# Patient Record
Sex: Female | Born: 1975
Health system: Southern US, Community
[De-identification: ages and names within clinical notes are randomized; demographics above are authoritative.]

## PROBLEM LIST (undated history)

## (undated) DIAGNOSIS — F1020 Alcohol dependence, uncomplicated: Secondary | ICD-10-CM

## (undated) DIAGNOSIS — K589 Irritable bowel syndrome without diarrhea: Secondary | ICD-10-CM

## (undated) DIAGNOSIS — F319 Bipolar disorder, unspecified: Secondary | ICD-10-CM

## (undated) DIAGNOSIS — G40909 Epilepsy, unspecified, not intractable, without status epilepticus: Secondary | ICD-10-CM

## (undated) DIAGNOSIS — E7800 Pure hypercholesterolemia, unspecified: Secondary | ICD-10-CM

## (undated) DIAGNOSIS — E039 Hypothyroidism, unspecified: Secondary | ICD-10-CM

## (undated) DIAGNOSIS — N83209 Unspecified ovarian cyst, unspecified side: Secondary | ICD-10-CM

## (undated) DIAGNOSIS — E538 Deficiency of other specified B group vitamins: Secondary | ICD-10-CM

## (undated) DIAGNOSIS — O139 Gestational [pregnancy-induced] hypertension without significant proteinuria, unspecified trimester: Secondary | ICD-10-CM

## (undated) DIAGNOSIS — J189 Pneumonia, unspecified organism: Secondary | ICD-10-CM

## (undated) DIAGNOSIS — Z8742 Personal history of other diseases of the female genital tract: Secondary | ICD-10-CM

## (undated) DIAGNOSIS — E119 Type 2 diabetes mellitus without complications: Secondary | ICD-10-CM

## (undated) DIAGNOSIS — N870 Mild cervical dysplasia: Secondary | ICD-10-CM

## (undated) DIAGNOSIS — I509 Heart failure, unspecified: Secondary | ICD-10-CM

## (undated) DIAGNOSIS — T1491XA Suicide attempt, initial encounter: Secondary | ICD-10-CM

## (undated) DIAGNOSIS — F988 Other specified behavioral and emotional disorders with onset usually occurring in childhood and adolescence: Secondary | ICD-10-CM

## (undated) DIAGNOSIS — R112 Nausea with vomiting, unspecified: Secondary | ICD-10-CM

## (undated) DIAGNOSIS — R011 Cardiac murmur, unspecified: Secondary | ICD-10-CM

## (undated) DIAGNOSIS — Z973 Presence of spectacles and contact lenses: Secondary | ICD-10-CM

## (undated) DIAGNOSIS — E669 Obesity, unspecified: Secondary | ICD-10-CM

## (undated) DIAGNOSIS — Z5189 Encounter for other specified aftercare: Secondary | ICD-10-CM

## (undated) DIAGNOSIS — F32A Depression, unspecified: Secondary | ICD-10-CM

## (undated) DIAGNOSIS — F329 Major depressive disorder, single episode, unspecified: Secondary | ICD-10-CM

## (undated) DIAGNOSIS — K449 Diaphragmatic hernia without obstruction or gangrene: Secondary | ICD-10-CM

## (undated) DIAGNOSIS — K219 Gastro-esophageal reflux disease without esophagitis: Secondary | ICD-10-CM

## (undated) DIAGNOSIS — Z9889 Other specified postprocedural states: Secondary | ICD-10-CM

## (undated) DIAGNOSIS — F419 Anxiety disorder, unspecified: Secondary | ICD-10-CM

## (undated) DIAGNOSIS — K760 Fatty (change of) liver, not elsewhere classified: Secondary | ICD-10-CM

## (undated) DIAGNOSIS — Z8719 Personal history of other diseases of the digestive system: Secondary | ICD-10-CM

## (undated) DIAGNOSIS — Z8781 Personal history of (healed) traumatic fracture: Secondary | ICD-10-CM

## (undated) DIAGNOSIS — L723 Sebaceous cyst: Secondary | ICD-10-CM

## (undated) DIAGNOSIS — D649 Anemia, unspecified: Secondary | ICD-10-CM

## (undated) DIAGNOSIS — R32 Unspecified urinary incontinence: Secondary | ICD-10-CM

## (undated) HISTORY — DX: Heart failure, unspecified: I50.9

## (undated) HISTORY — DX: Encounter for other specified aftercare: Z51.89

## (undated) HISTORY — DX: Obesity, unspecified: E66.9

## (undated) HISTORY — PX: VAGINAL HYSTERECTOMY: SUR661

## (undated) HISTORY — DX: Type 2 diabetes mellitus without complications: E11.9

## (undated) HISTORY — DX: Alcohol dependence, uncomplicated: F10.20

## (undated) HISTORY — DX: Cardiac murmur, unspecified: R01.1

## (undated) HISTORY — PX: GYNECOLOGIC CRYOSURGERY: SHX857

## (undated) HISTORY — DX: Epilepsy, unspecified, not intractable, without status epilepticus: G40.909

## (undated) HISTORY — DX: Other specified behavioral and emotional disorders with onset usually occurring in childhood and adolescence: F98.8

## (undated) HISTORY — PX: WISDOM TOOTH EXTRACTION: SHX21

---

## 1999-05-25 HISTORY — PX: CHOLECYSTECTOMY: SHX55

## 1999-05-25 HISTORY — PX: TUBAL LIGATION: SHX77

## 2008-05-24 HISTORY — PX: GASTRIC BYPASS: SHX52

## 2010-08-14 ENCOUNTER — Ambulatory Visit (INDEPENDENT_AMBULATORY_CARE_PROVIDER_SITE_OTHER): Payer: BC Managed Care – PPO | Admitting: Gynecology

## 2010-08-14 DIAGNOSIS — B3731 Acute candidiasis of vulva and vagina: Secondary | ICD-10-CM

## 2010-08-14 DIAGNOSIS — N920 Excessive and frequent menstruation with regular cycle: Secondary | ICD-10-CM

## 2010-08-14 DIAGNOSIS — B373 Candidiasis of vulva and vagina: Secondary | ICD-10-CM

## 2010-08-14 DIAGNOSIS — N898 Other specified noninflammatory disorders of vagina: Secondary | ICD-10-CM

## 2010-08-14 DIAGNOSIS — N946 Dysmenorrhea, unspecified: Secondary | ICD-10-CM

## 2010-09-02 ENCOUNTER — Ambulatory Visit: Payer: Self-pay | Admitting: Gynecology

## 2010-11-20 ENCOUNTER — Ambulatory Visit (INDEPENDENT_AMBULATORY_CARE_PROVIDER_SITE_OTHER): Payer: BC Managed Care – PPO | Admitting: Gynecology

## 2010-11-20 DIAGNOSIS — B3731 Acute candidiasis of vulva and vagina: Secondary | ICD-10-CM

## 2010-11-20 DIAGNOSIS — N898 Other specified noninflammatory disorders of vagina: Secondary | ICD-10-CM

## 2010-11-20 DIAGNOSIS — N926 Irregular menstruation, unspecified: Secondary | ICD-10-CM

## 2010-11-20 DIAGNOSIS — B373 Candidiasis of vulva and vagina: Secondary | ICD-10-CM

## 2010-11-30 ENCOUNTER — Encounter (HOSPITAL_COMMUNITY)
Admission: RE | Admit: 2010-11-30 | Discharge: 2010-11-30 | Disposition: A | Discharge status: Home / Self Care | Payer: BC Managed Care – PPO | Source: Ambulatory Visit | Attending: Gynecology | Admitting: Gynecology

## 2010-11-30 ENCOUNTER — Encounter (HOSPITAL_COMMUNITY): Payer: Self-pay

## 2010-11-30 HISTORY — DX: Gastro-esophageal reflux disease without esophagitis: K21.9

## 2010-11-30 HISTORY — DX: Other specified postprocedural states: Z98.890

## 2010-11-30 HISTORY — DX: Heart failure, unspecified: I50.9

## 2010-11-30 HISTORY — DX: Other specified postprocedural states: R11.2

## 2010-11-30 HISTORY — DX: Diaphragmatic hernia without obstruction or gangrene: K44.9

## 2010-11-30 LAB — CBC
HCT: 34.7 % — ABNORMAL LOW (ref 36.0–46.0)
Hemoglobin: 11.1 g/dL — ABNORMAL LOW (ref 12.0–15.0)
MCH: 28 pg (ref 26.0–34.0)
MCHC: 32 g/dL (ref 30.0–36.0)
MCV: 87.4 fL (ref 78.0–100.0)
Platelets: 298 10*3/uL (ref 150–400)
RBC: 3.97 MIL/uL (ref 3.87–5.11)
RDW: 13.8 % (ref 11.5–15.5)
WBC: 3.6 10*3/uL — ABNORMAL LOW (ref 4.0–10.5)

## 2010-11-30 LAB — COMPREHENSIVE METABOLIC PANEL
ALT: 11 U/L (ref 0–35)
AST: 17 U/L (ref 0–37)
Albumin: 3.5 g/dL (ref 3.5–5.2)
Alkaline Phosphatase: 62 U/L (ref 39–117)
BUN: 9 mg/dL (ref 6–23)
CO2: 27 mEq/L (ref 19–32)
Calcium: 8.9 mg/dL (ref 8.4–10.5)
Chloride: 102 mEq/L (ref 96–112)
Creatinine, Ser: 0.71 mg/dL (ref 0.50–1.10)
GFR calc Af Amer: >60 mL/min (ref >60)
GFR calc non Af Amer: >60 mL/min (ref >60)
Glucose, Bld: 90 mg/dL (ref 70–99)
Potassium: 4 mEq/L (ref 3.5–5.1)
Sodium: 137 mEq/L (ref 135–145)
Total Bilirubin: 0.2 mg/dL — ABNORMAL LOW (ref 0.3–1.2)
Total Protein: 7.2 g/dL (ref 6.0–8.3)

## 2010-11-30 LAB — MRSA PCR SCREENING: MRSA by PCR: NEGATIVE

## 2010-11-30 NOTE — Patient Instructions (Signed)
Written instructions given

## 2010-12-02 NOTE — H&P (Signed)
History and Physical    Chief complaint: menorrhagia, dysmenorrhea  History of present illness: 35 year old G1 P65 female presents complaining of menorrhagia and dysmenorrhea. Patient notes that over the past several years her menses have progressively gotten heavier and more irregular accelerating after her gastric bypass surgery. Outpatient evaluation by another gynecologist included normal blood work and hormonal studies and ultrasound which was suggestive of adenomyosis and an endometrial biopsy which was negative. She was placed on a low-dose oral contraceptives but has continued to bleed heavily with significant dysmenorrhea. Options for management were reviewed with the patient to include continued attempts at hormonal manipulation such as pill, patch, ring, Depo-Provera, Depo-Lupron, Mirena IUD, endometrial ablation and hysterectomy. The patient elects for a hysterectomy and she is admitted for attempted total laparoscopic hysterectomy.  Past medical history:  GERD, postpartum cardiomyopathy with congestive heart failure 2001, with reported complete recovery and released from cardiology  Past surgical history:  Gastric bypass, tubal sterilization, cholecystectomy  Allergies:  Vicodin and hydrocodone cause nausea and vomiting  Current medications:  See medication reconciliation sheet  Review of systems: Noncontributory  Family history: Noncontributory   Social history: Noncontributory   Physical exam:  Afebrile vital signs are stable  General: well developed , well nourished white female, no acute distress HEENT: normal  Lungs: clear to auscultation without wheezing, rales or rhonchi  Cardiac: regular rate without rubs, murmurs or gallops  Abdomen: soft, nontender without masses, guarding, rebound, organomegaly  Pelvic: external bus vagina: normal   Cervix: grossly normal  Uterus: normal size, midline and mobile, nontender  Adnexa: without masses or tenderness  Rectovaginal  exam within normal limits   Assessment: 35 year old G1 P1 female worsening menorrhagia dysmenorrhea refractile to oral contraceptives ultrasound suggestive of adenomyosis. Options for management as per discussed in history of present illness reviewed.  Plan: The patient was counseled as to the risks benefits indications and alternatives for laparoscopic hysterectomy. Recognizing the patient has had a tubal sterilization, she understands understands that hysterectomy is absolute and irreversible sterility. Sexuality following hysterectomy was reviewed and the risk for persistent orgasmic dysfunction and persistent dyspareunia was discussed understood and accepted. The ovarian conservation issue was discussed with the patient and the options for keeping both ovaries or removing both ovaries was reviewed. The patient understands by keeping both ovaries she is at risk for ovarian disease in the future, both benign and malignant. This may require future treatments as well as future surgeries and again the risk of ovarian cancer. She also understands that any hormonal symptoms that she is having such as PMS will persist by keeping her ovaries. If she has both ovaries removed, she is at risk for hypo-estrogenism to include menopausal symptoms, accelerated bone loss and accelerated cardiovascular risk. The issues of hormone replacement therapy were also reviewed to include the WHI study with risks of stroke, heart attack, DVT and the risks of breast cancer. The ACOG and NAMS statements on HRT were reviewed.  The patient understands the issues and she wants to keep both ovaries for continued hormone production accepting the risk of problems in the future requiring reoperation and treatment, the risk of ovarian cancer and the risk of hormonal symptoms. The patient does give me permission to remove one or both ovaries if significant disease is encountered at the time of surgery, and I feel that this is in her best  interest. The patient also understands that at any time during the procedure if significant scarring, adhesions or complications are encountered that I may  convert the laparoscopic approach to an exploratory laparotomy with a larger incision that would require a longer recovery period and a larger scar. The expected intraoperative and postoperative course was reviewed with the patient to include trocar placement, insufflation, multiple port sites, use of sharp blunt dissection, electrocautery, harmonic scalpel, possible laser and the use of clips and other permanent devices. The risks of infection requiring prolonged antibiotics as well as abscess formation or hematoma formation requiring reoperation and drainage were discussed. The risk of hemorrhage necessitating transfusion and the risks of transfusion to include transfusion reaction, hepatitis, HIV, mad cow disease and other unknown entities were discussed understood and accepted. The risk of inadvertent injury to internal organs either immediately recognized or delay recognized including bowel, bladder, ureters, vessels and nerves requiring major exploratory reparative surgery and future reparative surgeries including bladder repair, ureteral damage repair, bowel resection and ostomy formation was all discussed understood and accepted. The risk of incisional complications requiring opening and draining of incisions, closure by secondary intention and long-term issues such as scar formation, keloid, as well as hernia possibilities was all discussed understood and accepted. The patient's questions were answered to her satisfaction and she is ready to proceed with the surgery.   Dara Lords MD, 3:59 PM 12/02/2010

## 2010-12-06 ENCOUNTER — Encounter (HOSPITAL_COMMUNITY): Payer: Self-pay | Admitting: Anesthesiology

## 2010-12-06 MED ORDER — DEXTROSE 5 % IV SOLN
1.0000 g | Freq: Once | INTRAVENOUS | Status: DC
  Filled 2010-12-06: qty 1

## 2010-12-06 NOTE — Anesthesia Preprocedure Evaluation (Addendum)
Anesthesia Evaluation  Name, MR# and DOB Patient awake  General Assessment Comment  Reviewed: Allergy & Precautions, H&P  and Patient's Chart, lab work & pertinent test results  History of Anesthesia Complications (+) PONV  Airway Mallampati: II TM Distance: >3 FB Neck ROM: Full    Dental No notable dental hx (+) Teeth Intact and Caps   Pulmonaryneg pulmonary ROS    clear to auscultation  pulmonary exam normal   Cardiovascular Exercise Tolerance: Good +CHF Regular Normal Hx/o Post Partum Cardiomyopathy-resolved. Normal EchoHx/o Post Partum Cardiomyopathy-resolved. Normal Echo:    Neuro/Psych (+) {AN ROS/MED HX NEURO HEADACHES (+) Anxiety, Negative Psych ROS  GI/Hepatic/Renal negative Liver ROS, and negative Renal ROS (+) hiatal hernia,  GERD Controlled     Endo/Other  Hx/o Gastric Bypass Abdominal   Musculoskeletal negative musculoskeletal ROS (+)  Hematology On Cyanocobalamin since Gastric bypass   Peds  Reproductive/Obstetrics negative OB ROS   Anesthesia Other Findings             Anesthesia Physical Anesthesia Plan  ASA: II  Anesthesia Plan: General   Post-op Pain Management:    Induction: Intravenous  Airway Management Planned: Oral ETT  Additional Equipment:   Intra-op Plan:   Post-operative Plan: Extubation in OR  Informed Consent: I have reviewed the patients History and Physical, chart, labs and discussed the procedure including the risks, benefits and alternatives for the proposed anesthesia with the patient or authorized representative who has indicated his/her understanding and acceptance.   Dental advisory given  Plan Discussed with:   Anesthesia Plan Comments:         Anesthesia Quick Evaluation

## 2010-12-07 ENCOUNTER — Other Ambulatory Visit: Payer: Self-pay | Admitting: Gynecology

## 2010-12-07 ENCOUNTER — Encounter (HOSPITAL_COMMUNITY): Payer: Self-pay | Admitting: Anesthesiology

## 2010-12-07 ENCOUNTER — Ambulatory Visit (HOSPITAL_COMMUNITY): Payer: BC Managed Care – PPO | Admitting: Anesthesiology

## 2010-12-07 ENCOUNTER — Encounter (HOSPITAL_COMMUNITY): Payer: Self-pay | Admitting: *Deleted

## 2010-12-07 ENCOUNTER — Encounter: Payer: Self-pay | Admitting: Gynecology

## 2010-12-07 ENCOUNTER — Encounter (HOSPITAL_COMMUNITY): Payer: Self-pay | Admitting: Registered Nurse

## 2010-12-07 ENCOUNTER — Ambulatory Visit (HOSPITAL_COMMUNITY)
Admission: RE | Admit: 2010-12-07 | Discharge: 2010-12-08 | Disposition: A | Discharge status: Home / Self Care | Payer: BC Managed Care – PPO | Source: Ambulatory Visit | Attending: Gynecology | Admitting: Gynecology

## 2010-12-07 ENCOUNTER — Encounter (HOSPITAL_COMMUNITY)
Admission: RE | Disposition: A | Discharge status: Home / Self Care | Payer: Self-pay | Source: Ambulatory Visit | Attending: Gynecology

## 2010-12-07 DIAGNOSIS — N802 Endometriosis of fallopian tube: Secondary | ICD-10-CM

## 2010-12-07 DIAGNOSIS — Z01818 Encounter for other preprocedural examination: Secondary | ICD-10-CM | POA: Insufficient documentation

## 2010-12-07 DIAGNOSIS — N80209 Endometriosis of unspecified fallopian tube, unspecified depth: Secondary | ICD-10-CM

## 2010-12-07 DIAGNOSIS — N920 Excessive and frequent menstruation with regular cycle: Secondary | ICD-10-CM

## 2010-12-07 DIAGNOSIS — D259 Leiomyoma of uterus, unspecified: Secondary | ICD-10-CM

## 2010-12-07 DIAGNOSIS — N946 Dysmenorrhea, unspecified: Secondary | ICD-10-CM

## 2010-12-07 DIAGNOSIS — D252 Subserosal leiomyoma of uterus: Secondary | ICD-10-CM | POA: Insufficient documentation

## 2010-12-07 DIAGNOSIS — Z01812 Encounter for preprocedural laboratory examination: Secondary | ICD-10-CM | POA: Insufficient documentation

## 2010-12-07 HISTORY — PX: LAPAROSCOPIC TOTAL HYSTERECTOMY: SUR800

## 2010-12-07 LAB — HCG, QUANTITATIVE, PREGNANCY: hCG, Beta Chain, Quant, S: 1 m[IU]/mL (ref <5)

## 2010-12-07 SURGERY — HYSTERECTOMY, TOTAL, LAPAROSCOPIC
Anesthesia: General | Wound class: Clean Contaminated

## 2010-12-07 MED ORDER — ROCURONIUM BROMIDE 50 MG/5ML IV SOLN
INTRAVENOUS | Status: AC
  Filled 2010-12-07: qty 1

## 2010-12-07 MED ORDER — NEOSTIGMINE METHYLSULFATE 1 MG/ML IJ SOLN
INTRAMUSCULAR | Status: AC
  Filled 2010-12-07: qty 10

## 2010-12-07 MED ORDER — FENTANYL CITRATE 0.05 MG/ML IJ SOLN
25.0000 ug | INTRAMUSCULAR | Status: DC | PRN
  Administered 2010-12-07 (×2): 50 ug via INTRAVENOUS

## 2010-12-07 MED ORDER — MORPHINE SULFATE 2 MG/ML IJ SOLN: 2.0000 mg | INTRAMUSCULAR | Status: DC | PRN

## 2010-12-07 MED ORDER — DEXAMETHASONE SODIUM PHOSPHATE 10 MG/ML IJ SOLN
INTRAMUSCULAR | Status: DC | PRN
  Administered 2010-12-07: 10 mg via INTRAVENOUS

## 2010-12-07 MED ORDER — MIDAZOLAM HCL 2 MG/2ML IJ SOLN
INTRAMUSCULAR | Status: AC
  Filled 2010-12-07: qty 2

## 2010-12-07 MED ORDER — MIDAZOLAM HCL 5 MG/5ML IJ SOLN
INTRAMUSCULAR | Status: DC | PRN
  Administered 2010-12-07: 2 mg via INTRAVENOUS

## 2010-12-07 MED ORDER — SODIUM CHLORIDE 0.9 % IJ SOLN
3.0000 mL | Freq: Two times a day (BID) | INTRAMUSCULAR | Status: DC
  Administered 2010-12-07: 3 mL via INTRAVENOUS

## 2010-12-07 MED ORDER — LIDOCAINE HCL (CARDIAC) 20 MG/ML IV SOLN
INTRAVENOUS | Status: DC | PRN
  Administered 2010-12-07: 50 mg via INTRAVENOUS

## 2010-12-07 MED ORDER — KETOROLAC TROMETHAMINE 30 MG/ML IJ SOLN
INTRAMUSCULAR | Status: AC
  Filled 2010-12-07: qty 1

## 2010-12-07 MED ORDER — MORPHINE SULFATE 4 MG/ML IJ SOLN
2.0000 mg | INTRAMUSCULAR | Status: DC | PRN
  Administered 2010-12-07 – 2010-12-08 (×6): 2 mg via INTRAVENOUS
  Filled 2010-12-07 (×6): qty 1

## 2010-12-07 MED ORDER — ROCURONIUM BROMIDE 100 MG/10ML IV SOLN
INTRAVENOUS | Status: DC | PRN
  Administered 2010-12-07: 5 mg via INTRAVENOUS
  Administered 2010-12-07: 25 mg via INTRAVENOUS

## 2010-12-07 MED ORDER — SODIUM CHLORIDE 0.9 % IV SOLN: 250.0000 mL | INTRAVENOUS | Status: DC

## 2010-12-07 MED ORDER — LACTATED RINGERS IV SOLN
INTRAVENOUS | Status: DC
  Administered 2010-12-07 (×3): via INTRAVENOUS

## 2010-12-07 MED ORDER — NEOSTIGMINE METHYLSULFATE 1 MG/ML IJ SOLN
INTRAMUSCULAR | Status: DC | PRN
  Administered 2010-12-07: 2.5 mg via INTRAMUSCULAR

## 2010-12-07 MED ORDER — SUFENTANIL CITRATE 50 MCG/ML IV SOLN
INTRAVENOUS | Status: DC | PRN
  Administered 2010-12-07 (×5): 10 ug via INTRAVENOUS

## 2010-12-07 MED ORDER — ONDANSETRON HCL 4 MG/2ML IJ SOLN
INTRAMUSCULAR | Status: AC
  Filled 2010-12-07: qty 2

## 2010-12-07 MED ORDER — ONDANSETRON HCL 4 MG/2ML IJ SOLN
4.0000 mg | Freq: Four times a day (QID) | INTRAMUSCULAR | Status: DC | PRN
  Administered 2010-12-07 (×2): 4 mg via INTRAVENOUS
  Filled 2010-12-07 (×2): qty 2

## 2010-12-07 MED ORDER — HYDROMORPHONE HCL 1 MG/ML IJ SOLN
INTRAMUSCULAR | Status: AC
  Filled 2010-12-07: qty 1

## 2010-12-07 MED ORDER — KETOROLAC TROMETHAMINE 30 MG/ML IJ SOLN
30.0000 mg | Freq: Four times a day (QID) | INTRAMUSCULAR | Status: AC
  Administered 2010-12-07 (×2): 30 mg via INTRAVENOUS
  Filled 2010-12-07 (×3): qty 1

## 2010-12-07 MED ORDER — LIDOCAINE HCL (CARDIAC) 20 MG/ML IV SOLN
INTRAVENOUS | Status: AC
  Filled 2010-12-07: qty 5

## 2010-12-07 MED ORDER — HYDROMORPHONE HCL 1 MG/ML IJ SOLN
INTRAMUSCULAR | Status: DC | PRN
  Administered 2010-12-07: 1 mg via INTRAVENOUS

## 2010-12-07 MED ORDER — SUCCINYLCHOLINE CHLORIDE 20 MG/ML IJ SOLN
INTRAMUSCULAR | Status: DC | PRN
  Administered 2010-12-07: 110 mg via INTRAVENOUS

## 2010-12-07 MED ORDER — PROPOFOL 10 MG/ML IV EMUL
INTRAVENOUS | Status: DC | PRN
  Administered 2010-12-07: 170 mg via INTRAVENOUS

## 2010-12-07 MED ORDER — KETOROLAC TROMETHAMINE 60 MG/2ML IM SOLN: INTRAMUSCULAR | Status: DC | PRN

## 2010-12-07 MED ORDER — ONDANSETRON HCL 4 MG PO TABS: 4.0000 mg | ORAL_TABLET | Freq: Four times a day (QID) | ORAL | Status: DC | PRN

## 2010-12-07 MED ORDER — SUFENTANIL CITRATE 50 MCG/ML IV SOLN
INTRAVENOUS | Status: AC
  Filled 2010-12-07: qty 1

## 2010-12-07 MED ORDER — BUPIVACAINE HCL (PF) 0.25 % IJ SOLN
INTRAMUSCULAR | Status: DC | PRN
  Administered 2010-12-07: 8 mL

## 2010-12-07 MED ORDER — GLYCOPYRROLATE 0.2 MG/ML IJ SOLN
INTRAMUSCULAR | Status: AC
  Filled 2010-12-07: qty 1

## 2010-12-07 MED ORDER — DEXTROSE IN LACTATED RINGERS 5 % IV SOLN
INTRAVENOUS | Status: DC
  Administered 2010-12-07 – 2010-12-08 (×3): via INTRAVENOUS

## 2010-12-07 MED ORDER — PANTOPRAZOLE SODIUM 40 MG PO TBEC
DELAYED_RELEASE_TABLET | ORAL | Status: AC
  Administered 2010-12-07: 40 mg via ORAL
  Filled 2010-12-07: qty 1

## 2010-12-07 MED ORDER — PROMETHAZINE HCL 25 MG/ML IJ SOLN
6.2500 mg | INTRAMUSCULAR | Status: DC | PRN
  Administered 2010-12-07: 6.25 mg via INTRAVENOUS

## 2010-12-07 MED ORDER — OXYCODONE-ACETAMINOPHEN 5-325 MG PO TABS
1.0000 | ORAL_TABLET | ORAL | Status: DC | PRN
  Administered 2010-12-07: 2 via ORAL
  Administered 2010-12-07: 1 via ORAL
  Administered 2010-12-08: 2 via ORAL
  Administered 2010-12-08: 1 via ORAL
  Filled 2010-12-07 (×2): qty 1
  Filled 2010-12-07 (×2): qty 2

## 2010-12-07 MED ORDER — SCOPOLAMINE 1 MG/3DAYS TD PT72
MEDICATED_PATCH | TRANSDERMAL | Status: AC
  Administered 2010-12-07: 1.5 mg via TRANSDERMAL
  Filled 2010-12-07: qty 1

## 2010-12-07 MED ORDER — KETOROLAC TROMETHAMINE 30 MG/ML IJ SOLN: 30.0000 mg | Freq: Four times a day (QID) | INTRAMUSCULAR | Status: DC

## 2010-12-07 MED ORDER — DEXTROSE 5 % IV SOLN
1.0000 g | INTRAVENOUS | Status: DC | PRN
  Administered 2010-12-07: 1 g via INTRAVENOUS

## 2010-12-07 MED ORDER — SODIUM BICARBONATE 8.4 % IV SOLN
INTRAVENOUS | Status: AC
  Filled 2010-12-07: qty 50

## 2010-12-07 MED ORDER — PROMETHAZINE HCL 25 MG/ML IJ SOLN
INTRAMUSCULAR | Status: AC
  Filled 2010-12-07: qty 1

## 2010-12-07 MED ORDER — KETOROLAC TROMETHAMINE 30 MG/ML IJ SOLN
INTRAMUSCULAR | Status: DC | PRN
  Administered 2010-12-07: 30 mg via INTRAVENOUS

## 2010-12-07 MED ORDER — ONDANSETRON HCL 4 MG/2ML IJ SOLN
INTRAMUSCULAR | Status: DC | PRN
  Administered 2010-12-07: 4 mg via INTRAVENOUS

## 2010-12-07 MED ORDER — MEPERIDINE HCL 25 MG/ML IJ SOLN: 6.2500 mg | INTRAMUSCULAR | Status: DC | PRN

## 2010-12-07 MED ORDER — DIPHENHYDRAMINE HCL 25 MG PO CAPS: 50.0000 mg | ORAL_CAPSULE | Freq: Four times a day (QID) | ORAL | Status: DC | PRN

## 2010-12-07 MED ORDER — DEXAMETHASONE SODIUM PHOSPHATE 10 MG/ML IJ SOLN
INTRAMUSCULAR | Status: AC
  Filled 2010-12-07: qty 1

## 2010-12-07 MED ORDER — PANTOPRAZOLE SODIUM 40 MG PO TBEC
40.0000 mg | DELAYED_RELEASE_TABLET | Freq: Once | ORAL | Status: AC
  Administered 2010-12-07: 40 mg via ORAL

## 2010-12-07 MED ORDER — GLYCOPYRROLATE 0.2 MG/ML IJ SOLN
INTRAMUSCULAR | Status: DC | PRN
  Administered 2010-12-07: .3 mg via INTRAVENOUS

## 2010-12-07 MED ORDER — PANTOPRAZOLE SODIUM 40 MG PO TBEC
40.0000 mg | DELAYED_RELEASE_TABLET | Freq: Every day | ORAL | Status: DC
  Administered 2010-12-07: 40 mg via ORAL
  Filled 2010-12-07: qty 1

## 2010-12-07 MED ORDER — ALPRAZOLAM 0.5 MG PO TABS: 0.5000 mg | ORAL_TABLET | Freq: Every evening | ORAL | Status: DC | PRN

## 2010-12-07 MED ORDER — SCOPOLAMINE 1 MG/3DAYS TD PT72
1.0000 | MEDICATED_PATCH | Freq: Once | TRANSDERMAL | Status: DC
  Administered 2010-12-07: 1.5 mg via TRANSDERMAL

## 2010-12-07 MED ORDER — FENTANYL CITRATE 0.05 MG/ML IJ SOLN
INTRAMUSCULAR | Status: AC
  Filled 2010-12-07: qty 2

## 2010-12-07 MED ORDER — SODIUM CHLORIDE 0.9 % IJ SOLN: 3.0000 mL | INTRAMUSCULAR | Status: DC | PRN

## 2010-12-07 MED ORDER — PROPOFOL 10 MG/ML IV EMUL
INTRAVENOUS | Status: AC
  Filled 2010-12-07: qty 20

## 2010-12-07 SURGICAL SUPPLY — 47 items
BLADE SURG 15 STRL LF C SS BP (BLADE) ×1 IMPLANT
BLADE SURG 15 STRL SS (BLADE) ×1
CABLE HIGH FREQUENCY MONO STRZ (ELECTRODE) IMPLANT
CLOTH BEACON ORANGE TIMEOUT ST (SAFETY) ×2 IMPLANT
CONT PATH 16OZ SNAP LID 3702 (MISCELLANEOUS) IMPLANT
COVER LIGHT HANDLE  1/PK (MISCELLANEOUS) ×1
COVER LIGHT HANDLE 1/PK (MISCELLANEOUS) ×1 IMPLANT
COVER MAYO STAND STRL (DRAPES) ×2 IMPLANT
COVER TABLE BACK 60X90 (DRAPES) ×2 IMPLANT
DERMABOND ADVANCED (GAUZE/BANDAGES/DRESSINGS) ×2 IMPLANT
DEVICE SUTURE ENDOST 10MM (ENDOMECHANICALS) ×4 IMPLANT
DISSECTOR BLUNT TIP ENDO 5MM (MISCELLANEOUS) IMPLANT
DRAPE CAMERA CLOSED 9X96 (DRAPES) ×2 IMPLANT
DRAPE HYSTEROSCOPY (DRAPE) ×2 IMPLANT
ENDOSTITCH 0 SINGLE 48 (SUTURE) ×10 IMPLANT
EVACUATOR SMOKE 8.L (FILTER) ×2 IMPLANT
GLOVE BIO SURGEON STRL SZ7.5 (GLOVE) ×6 IMPLANT
GLOVE BIOGEL PI IND STRL 7.5 (GLOVE) ×1 IMPLANT
GLOVE BIOGEL PI INDICATOR 7.5 (GLOVE) ×1
GLOVE INDICATOR 7.0 STRL GRN (GLOVE) ×2 IMPLANT
GLOVE SURG SS PI 6.5 STRL IVOR (GLOVE) ×2 IMPLANT
GLOVE SURG SS PI 7.0 STRL IVOR (GLOVE) ×6 IMPLANT
GOWN BRE IMP SLV AUR LG STRL (GOWN DISPOSABLE) ×6 IMPLANT
GYRUS RUMI II 4.0CM BLUE (DISPOSABLE) ×2
NS IRRIG 1000ML POUR BTL (IV SOLUTION) ×2 IMPLANT
OCCLUDER COLPOPNEUMO (BALLOONS) ×2 IMPLANT
PACK LAPAROSCOPY BASIN (CUSTOM PROCEDURE TRAY) ×2 IMPLANT
RUMI II GYRUS 4.0CM BLUE (DISPOSABLE) ×1 IMPLANT
SCALPEL HARMONIC ACE (MISCELLANEOUS) ×2 IMPLANT
SCISSORS LAP 5X35 DISP (ENDOMECHANICALS) IMPLANT
SET CYSTO W/LG BORE CLAMP LF (SET/KITS/TRAYS/PACK) IMPLANT
SET IRRIG TUBING LAPAROSCOPIC (IRRIGATION / IRRIGATOR) ×2 IMPLANT
SUT PLAIN 4 0 FS 2 27 (SUTURE) ×4 IMPLANT
SUT VICRYL 0 UR6 27IN ABS (SUTURE) ×2 IMPLANT
SYR 50ML LL SCALE MARK (SYRINGE) ×2 IMPLANT
SYR BULB IRRIGATION 50ML (SYRINGE) ×2 IMPLANT
TIP UTERINE 5.1X6CM LAV DISP (MISCELLANEOUS) IMPLANT
TIP UTERINE 6.7X10CM GRN DISP (MISCELLANEOUS) IMPLANT
TIP UTERINE 6.7X6CM WHT DISP (MISCELLANEOUS) IMPLANT
TIP UTERINE 6.7X8CM BLUE DISP (MISCELLANEOUS) ×2 IMPLANT
TOWEL OR 17X24 6PK STRL BLUE (TOWEL DISPOSABLE) ×4 IMPLANT
TRAY FOLEY CATH 14FR (SET/KITS/TRAYS/PACK) ×2 IMPLANT
TROCAR BALLN 12MMX100 BLUNT (TROCAR) IMPLANT
TROCAR XCEL NON-BLD 11X100MML (ENDOMECHANICALS) ×4 IMPLANT
TROCAR XCEL NON-BLD 5MMX100MML (ENDOMECHANICALS) ×4 IMPLANT
WARMER LAPAROSCOPE (MISCELLANEOUS) ×2 IMPLANT
WATER STERILE IRR 1000ML POUR (IV SOLUTION) ×2 IMPLANT

## 2010-12-07 NOTE — Progress Notes (Signed)
Ana Rose  Date of surgery  Patient reports very little discomfort, is awake alert. On exam HEENT normal Lungs clear Cardiac regular rate no rubs murmurs or gallops Abdomen soft active bowel sounds dressings dry Scant vaginal discharge reported Assessment and plan Reviewed results of surgery with the patient and her husband to include the findings of leiomyoma and endometriosis. Patient is doing well from a pain standpoint and will continue routine postoperative care. I did review discharge instructions with them both to include ASAP call precautions and routine postoperative instructions. We'll plan on discharge tomorrow assuming she continues well and follow up in the office in 2 weeks.  Dara Lords MD, 2:06 PM 12/07/2010

## 2010-12-07 NOTE — Anesthesia Procedure Notes (Signed)
Performed by: Raechel Chute, Kenon Delashmit

## 2010-12-07 NOTE — Anesthesia Postprocedure Evaluation (Signed)
Vital signs stable Patient alert Pain and nausea are controlled No apparent anesthetic complications No follow up care needed 

## 2010-12-07 NOTE — Transfer of Care (Signed)
Immediate Anesthesia Transfer of Care Note  Patient: Ana Rose  Procedure(s) Performed:  HYSTERECTOMY TOTAL LAPAROSCOPIC - Colporation of endometriosis  Patient Location: PACU  Anesthesia Type: General  Level of Consciousness: awake, alert  and oriented  Airway & Oxygen Therapy: Patient Spontanous Breathing and Patient connected to nasal cannula oxygen  Post-op Assessment: Report given to PACU RN and Post -op Vital signs reviewed and stable  Post vital signs: stable  Complications: No apparent anesthesia complications

## 2010-12-07 NOTE — Op Note (Signed)
Post Operative Note  Date of surgery:  12/07/2010  Pre Op Dx: dysmenorrhea, menorrhagia  Post Op Dx  Dysmenorrhea, menorrhagia, leiomyoma, endometriosis  Procedure:   Total laparoscopic hysterectomy, fulguration endometriosis  Surgeon:  Dara Lords  Assistant:  Reynaldo Minium  Anesthesia:  General  Local Injection:  8 cc 0.25% Marcaine local skin injection  EBL:  50 cc  Complications:  None  Specimen:  Uterus with cervix to pathology  Findings: EUA:  External BUS vagina normal, cervix normal, uterus normal size shape and contour midline and mobile, adnexa without masses   Operative:   Anterior cul-de-sac normal, posterior cul-de-sac normal, uterus normal size with several small subserosal leiomyoma noted.  right and left fallopian tubes with evidence of prior tubal sterilization and classic powder burn endometriotic lesions noted on tubal segments bilaterally, both fulgurated. Ovaries grossly normal free and mobile. Upper abdominal exam liver smooth without adhesions gallbladder not visualized appendix not visualized.   Procedure:  Patient was taken to the operating room, properly identified, placed in the supine position and underwent endotracheal anesthesia without difficulty.  Patient was placed in the low dorsal lithotomy position and received an abdominal perineal and vaginal preparation with Betadine solution. An EUA was performed with findings noted above. The uterus was then sounded and the cervix was measured for the appropriate choice of the RUMI uterine manipulator and the KOH cup and these were placed without difficulty.  A Foley catheter was then placed in a sterile technique and the patient was draped in the usual fashion.  A repeat transverse infraumbilical incision was made and using the 10 mm direct entry trocar, the abdomen was directly entered without difficulty and subsequently insufflated. Right and left 5 mm suprapubic ports were then placed under direct  visualization after transillumination for the vessels without difficulty.  Examination of the pelvic organs and upper abdominal exam was then carried out with findings noted above. Using the harmonic scalpel the right uterine ovarian pedicle was identified and transected without difficulty and the right broad ligament and round ligament was likewise transected using the harmonic scalpel. A similar procedure was then carried out on the other side. The anterior peritoneal reflection of the vesicouterine fold was then sharply incised and the bladder flap was then sharply and bluntly developed without difficulty below the palpated KOH cup ring.  The right uterine vessels were then identified, bipolar cauterized to flow of 0 and incised using the harmonic scalpel. A similar procedure was carried out on the other side. Using the harmonic scalpel the anterior vagina was then entered along the easily palpable Coke cup and the uterus was then circumferentially excised running along the KOH cup. The uterus was then removed through the vagina and a suction bulb was placed in the vagina to maintain the pneumoperitoneum. The vagina was then closed anterior to posterior using the Endo Stitch suture applier in interrupted figure-of-eight sutures. To accomplish this the right 5 mm suprapubic port was replaced with a 10 mm port under direct visualization after transillumination for the vessels, to allow for use of the Endo Stitch applier. The pelvis was then irrigated and reinspected under low pressure situation showing adequate hemostasis. All pedicles were likewise inspected as was the upper vaginal cuff and again hemostasis was visualized. The classic endometrial  implants on both the right and left fallopian tubes were then bipolar cauterized.The suprapubic ports were then removed under direct visualization, showing adequate hemostasis and the infraumbilical port was then backed out under direct visualization showing adequate  hemostasis and no evidence of hernia formation. All skin incisions were injected using 0.25% Marcaine and the infraumbilical and right suprapubic port sites were closed using 0 Vicryl suture in an interrupted subcutaneous fascial stitch. All skin incisions were closed using 4-0 plain suture in interrupted cuticular stitch. The bulb suction was removed and a bimanual showed the cuff to have a secure closure. The patient was awakened without difficulty and taken to the recovery room in good condition having tolerated the procedure well with clear yellow free-flowing urine. The patient did receive intraoperative Toradol noting that her allergy to nonsteroidal anti-inflammatories was due to her gastric bypass causing pouch irritation and not a true allergic reaction.   Dara Lords MD, 9:21 AM 12/07/2010

## 2010-12-07 NOTE — H&P (Signed)
Dictated history and physical current.  Dara Lords MD, 7:05 AM 12/07/2010

## 2010-12-08 ENCOUNTER — Encounter: Payer: Self-pay | Admitting: Gynecology

## 2010-12-08 LAB — CBC
HCT: 29.5 % — ABNORMAL LOW (ref 36.0–46.0)
Hemoglobin: 9.6 g/dL — ABNORMAL LOW (ref 12.0–15.0)
MCH: 28.5 pg (ref 26.0–34.0)
MCHC: 32.5 g/dL (ref 30.0–36.0)
MCV: 87.5 fL (ref 78.0–100.0)
Platelets: 217 10*3/uL (ref 150–400)
RBC: 3.37 MIL/uL — ABNORMAL LOW (ref 3.87–5.11)
RDW: 14.1 % (ref 11.5–15.5)
WBC: 9.6 10*3/uL (ref 4.0–10.5)

## 2010-12-08 MED ORDER — ONDANSETRON HCL 4 MG PO TABS: 4.0000 mg | ORAL_TABLET | Freq: Four times a day (QID) | ORAL | Status: AC | PRN

## 2010-12-08 MED ORDER — OXYCODONE-ACETAMINOPHEN 5-325 MG PO TABS: 1.0000 | ORAL_TABLET | ORAL | Status: AC | PRN

## 2010-12-08 NOTE — Discharge Summary (Signed)
  Date of admission: 12/07/2010 Date of discharge: 12/08/2010 Discharge diagnoses: Menorrhagia, dysmenorrhea, leiomyoma, endometriosis Procedure: Total laparoscopic hysterectomy fulguration of endometriosis 12/08/2010 Pathology: Pending Hospital course: Patient underwent an uncomplicated total laparoscopic hysterectomy fulguration of endometriosis 12/08/2010. Patient's postoperative course was uncomplicated and she was discharged on postoperative day #1 ambulating well tolerating a regular diet voiding without difficulty with a postoperative hemoglobin of 9.6. Patient received precautions instructions and follow-up and will be seen in the office 2 weeks following discharge received a prescription for oxycodone acetaminophen 5-325 mg #25 one to 2 by mouth every 4 hours when necessary pain no refill.

## 2010-12-08 NOTE — Progress Notes (Signed)
1 Day Post-Op Procedure(s) (LRB): HYSTERECTOMY TOTAL LAPAROSCOPIC (N/A)  Subjective: Patient reports has no problems, feels well, pain severity reported mild,  taking PO, foley catheter out,  voiding spontaneaously,  ambulating, passing flatus  Objective: Afeb, VSS   EXAM General: awake, alert and cooperative Resp: rhonchi clear to auscultation bilaterally Cardio: regular rate and rhythm, S1, S2 normal, no murmur, click, rub or gallop GI: normal findings:soft, non-tender; bowel sounds normal; no masses,  no organomegaly and incision: clean, dry and intact Lower Extremities: Without swelling or tenderness Vaginal Bleeding: Reported scant  Assessment: s/p Procedure(s): HYSTERECTOMY TOTAL LAPAROSCOPIC: progressing well, ready for discharge.  Plan: Discharge home today.  Precautions, instructions and follow up were discussed with the patient.  Prescriptions provided  Per d/c summary.  Patient to call the office to arrange a post-operative appointmant in 2 weeks.  Dara Lords, MD 12/08/2010 8:33 AM

## 2010-12-10 ENCOUNTER — Encounter: Payer: Self-pay | Admitting: Gynecology

## 2010-12-15 ENCOUNTER — Encounter: Payer: Self-pay | Admitting: Gynecology

## 2010-12-16 ENCOUNTER — Encounter: Payer: Self-pay | Admitting: Gynecology

## 2010-12-21 ENCOUNTER — Ambulatory Visit (INDEPENDENT_AMBULATORY_CARE_PROVIDER_SITE_OTHER): Payer: BC Managed Care – PPO | Admitting: Gynecology

## 2010-12-21 ENCOUNTER — Encounter: Payer: Self-pay | Admitting: Gynecology

## 2010-12-21 ENCOUNTER — Ambulatory Visit: Payer: BC Managed Care – PPO | Admitting: Gynecology

## 2010-12-21 VITALS — Temp 97.8°F

## 2010-12-21 DIAGNOSIS — Z9889 Other specified postprocedural states: Secondary | ICD-10-CM

## 2010-12-21 NOTE — Progress Notes (Signed)
Patient presents 2 weeks postop status post TL H. Show without complaints. Findings of surgery includes endometriosis on both the fallopian tube segments that were was fulgurated. Exam today abdomen soft nontender without masses guarding rebound organomegaly abdominal incision is healing nicely several remaining sutures were removed.  Pelvic external BUS the vagina with cuff healing nicely bimanual without masses or tenderness. Status post PLH doing well results of surgery were reviewed with her to include the endometriosis. Patient will continue resume normal activities with exception of pelvic rest and will represent 2 weeks for her next post op check

## 2010-12-22 ENCOUNTER — Encounter: Payer: Self-pay | Admitting: Gynecology

## 2010-12-22 NOTE — Progress Notes (Signed)
  12-22-10 Patient brought return to work forms to her p.op visit yesterday.  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 This new form reflects that and also, that on return to work no bending, stooping, lifting or carrying anything > 10 lbs. I faxed the forms to [employer] this morning. Patient was notified that I faxed them. KA

## 2010-12-23 ENCOUNTER — Encounter (HOSPITAL_COMMUNITY): Payer: Self-pay | Admitting: Gynecology

## 2011-01-05 ENCOUNTER — Encounter: Payer: Self-pay | Admitting: Gynecology

## 2011-01-05 ENCOUNTER — Ambulatory Visit: Payer: BC Managed Care – PPO | Admitting: Gynecology

## 2011-01-05 ENCOUNTER — Ambulatory Visit (INDEPENDENT_AMBULATORY_CARE_PROVIDER_SITE_OTHER): Payer: BC Managed Care – PPO | Admitting: Gynecology

## 2011-01-05 DIAGNOSIS — F411 Generalized anxiety disorder: Secondary | ICD-10-CM

## 2011-01-05 DIAGNOSIS — F419 Anxiety disorder, unspecified: Secondary | ICD-10-CM

## 2011-01-05 DIAGNOSIS — Z09 Encounter for follow-up examination after completed treatment for conditions other than malignant neoplasm: Secondary | ICD-10-CM

## 2011-01-05 MED ORDER — ALPRAZOLAM 1 MG PO TABS: 1.0000 mg | ORAL_TABLET | Freq: Every evening | ORAL | Status: AC | PRN

## 2011-01-05 NOTE — Progress Notes (Signed)
Postop check status post [facility] 12/07/2010. Doing well was returned to work  Exam Abdomen: Soft nontender without masses guarding rebound organomegaly incisions healed nicely Pelvic: External BUS vagina normal cuff is healed nicely bimanual without masses or tenderness  Assessment and plan: Status post TLH doing well wants to return to work slowly resume all activities advised continue pelvic rest another 2 weeks to be 6 weeks postop. Assuming she does well then she'll see me in 6 months when she is due for her annual exam, sooner as needed. She is going through separation now having a lot of anxiety related to that and asked that I could refill her Xanax that she got originally from her primary. I wrote for Xanax 1 mg #30 no refill.

## 2011-01-06 ENCOUNTER — Encounter: Payer: Self-pay | Admitting: Gynecology

## 2011-01-06 NOTE — Progress Notes (Signed)
01-06-11 FAXED RETURN TO WORK LETTER TO [employer] FOR PT TO RTW DATE 01/11/11. COPY IN [place]. KA

## 2011-02-08 ENCOUNTER — Other Ambulatory Visit: Payer: Self-pay | Admitting: *Deleted

## 2011-02-08 MED ORDER — ALPRAZOLAM 1 MG PO TABS: 1.0000 mg | ORAL_TABLET | Freq: Every evening | ORAL | Status: DC | PRN

## 2011-02-08 NOTE — Telephone Encounter (Signed)
Pharmacy faxed over refill request. Pt seen on 01/05/11. Please advise

## 2011-02-08 NOTE — Telephone Encounter (Signed)
RX CALLED INTO PHARMACY

## 2011-03-05 ENCOUNTER — Other Ambulatory Visit: Payer: Self-pay | Admitting: *Deleted

## 2011-03-05 MED ORDER — ALPRAZOLAM 1 MG PO TABS: 1.0000 mg | ORAL_TABLET | Freq: Every evening | ORAL | Status: AC | PRN

## 2011-03-05 NOTE — Telephone Encounter (Signed)
Rx called in to pharmacy. 

## 2011-04-24 ENCOUNTER — Other Ambulatory Visit: Payer: Self-pay | Admitting: Gynecology

## 2011-05-10 ENCOUNTER — Other Ambulatory Visit: Payer: Self-pay | Admitting: *Deleted

## 2011-05-10 MED ORDER — ALPRAZOLAM 1 MG PO TABS: 1.0000 mg | ORAL_TABLET | Freq: Every evening | ORAL | Status: AC | PRN

## 2011-05-10 NOTE — Telephone Encounter (Signed)
rx called in

## 2011-05-25 DIAGNOSIS — G40909 Epilepsy, unspecified, not intractable, without status epilepticus: Secondary | ICD-10-CM

## 2011-05-25 HISTORY — DX: Epilepsy, unspecified, not intractable, without status epilepticus: G40.909

## 2011-06-18 ENCOUNTER — Other Ambulatory Visit: Payer: Self-pay | Admitting: *Deleted

## 2011-06-18 MED ORDER — ALPRAZOLAM 1 MG PO TABS: 1.0000 mg | ORAL_TABLET | Freq: Every evening | ORAL | Status: AC | PRN

## 2011-06-18 NOTE — Telephone Encounter (Signed)
rx called in

## 2011-07-19 ENCOUNTER — Ambulatory Visit (INDEPENDENT_AMBULATORY_CARE_PROVIDER_SITE_OTHER): Payer: BC Managed Care – PPO | Admitting: Gynecology

## 2011-07-19 ENCOUNTER — Encounter: Payer: Self-pay | Admitting: Gynecology

## 2011-07-19 ENCOUNTER — Other Ambulatory Visit (HOSPITAL_COMMUNITY)
Admission: RE | Admit: 2011-07-19 | Discharge: 2011-07-19 | Disposition: A | Discharge status: Home / Self Care | Payer: BC Managed Care – PPO | Source: Ambulatory Visit | Attending: Gynecology | Admitting: Gynecology

## 2011-07-19 VITALS — BP 128/78 | Ht 66.35 in | Wt 144.0 lb

## 2011-07-19 DIAGNOSIS — G40909 Epilepsy, unspecified, not intractable, without status epilepticus: Secondary | ICD-10-CM | POA: Insufficient documentation

## 2011-07-19 DIAGNOSIS — N898 Other specified noninflammatory disorders of vagina: Secondary | ICD-10-CM

## 2011-07-19 DIAGNOSIS — F988 Other specified behavioral and emotional disorders with onset usually occurring in childhood and adolescence: Secondary | ICD-10-CM | POA: Insufficient documentation

## 2011-07-19 DIAGNOSIS — Z01419 Encounter for gynecological examination (general) (routine) without abnormal findings: Secondary | ICD-10-CM

## 2011-07-19 LAB — WET PREP FOR TRICH, YEAST, CLUE
Trich, Wet Prep: NONE SEEN
WBC, Wet Prep HPF POC: NONE SEEN
Yeast Wet Prep HPF POC: NONE SEEN

## 2011-07-19 MED ORDER — FLUCONAZOLE 150 MG PO TABS: 150.0000 mg | ORAL_TABLET | Freq: Once | ORAL | Status: AC

## 2011-07-19 NOTE — Patient Instructions (Signed)
Follow up in one year for your annual gynecologic exam. 

## 2011-07-19 NOTE — Progress Notes (Signed)
Ana Rose 1975-11-25 [id number]        36 y.o.  for annual exam.  Doing well with a little bit of vaginal itching.  Recently started having seizures, was evaluated for this and now is on Lamictal.  Past medical history,surgical history, medications, allergies, family history and social history were all reviewed and documented in the EPIC chart. ROS:  Was performed and pertinent positives and negatives are included in the history.  Exam: Ana Rose chaperone present Filed Vitals:   07/19/11 1411  BP: 128/78   General appearance  Normal Skin grossly normal Head/Neck normal with no cervical or supraclavicular adenopathy thyroid normal Lungs  clear Cardiac RR, without RMG Abdominal  soft, nontender, without masses, organomegaly or hernia Breasts  examined lying and sitting without masses, retractions, discharge or axillary adenopathy. Pelvic  Ext/BUS/vagina  normal with white discharge. Pap of cuff done  Adnexa  Without masses or tenderness    Anus and perineum  normal   Rectovaginal  normal sphincter tone without palpated masses or tenderness.    Assessment/Plan:  36 y.o. female for annual exam.    1. Vaginal itching. Patients on antibiotics for URI. Wet prep initially was reported negative but I looked at the slides myself and saw yeast. We'll treat with Diflucan 150x1 #2 given one refill. Follow up if symptoms persist or recur. 2. History TLH. She's doing well no complaints. 3. Breast health. SBE monthly reviewed. Screening mammogram recommendations between 35 and 40 were reviewed. She has no strong family history she'll decide when she wants to do this. 4. Pap smear. I have no reports of past records in her chart. She does have a history of cryosurgery 12 years ago. Pap of cuff was done. 5. Health maintenance. No blood work was done today as is all done through her primary physician's office who she plans on seeing in March. Assuming she continues well from a gynecologic standpoint she  will see me in a year, sooner as needed.    Ana Lords MD, 2:53 PM 07/19/2011

## 2011-09-23 ENCOUNTER — Other Ambulatory Visit: Payer: Self-pay | Admitting: *Deleted

## 2011-09-23 MED ORDER — ALPRAZOLAM 1 MG PO TABS: 1.0000 mg | ORAL_TABLET | Freq: Every evening | ORAL | Status: AC | PRN

## 2011-09-23 NOTE — Telephone Encounter (Signed)
rx called in

## 2011-10-26 ENCOUNTER — Other Ambulatory Visit: Payer: Self-pay | Admitting: *Deleted

## 2011-10-26 MED ORDER — ALPRAZOLAM 1 MG PO TABS: 1.0000 mg | ORAL_TABLET | Freq: Every evening | ORAL | Status: DC | PRN

## 2011-10-26 NOTE — Telephone Encounter (Signed)
rx called in

## 2011-11-24 ENCOUNTER — Other Ambulatory Visit: Payer: Self-pay | Admitting: *Deleted

## 2011-11-24 MED ORDER — ALPRAZOLAM 1 MG PO TABS: 1.0000 mg | ORAL_TABLET | Freq: Every evening | ORAL | Status: DC | PRN

## 2011-11-24 NOTE — Telephone Encounter (Signed)
rx called in

## 2012-02-01 ENCOUNTER — Other Ambulatory Visit: Payer: Self-pay | Admitting: *Deleted

## 2012-02-01 MED ORDER — ALPRAZOLAM 1 MG PO TABS: 1.0000 mg | ORAL_TABLET | Freq: Every evening | ORAL | Status: AC | PRN

## 2012-03-17 ENCOUNTER — Ambulatory Visit (INDEPENDENT_AMBULATORY_CARE_PROVIDER_SITE_OTHER): Payer: BC Managed Care – PPO | Admitting: Gynecology

## 2012-03-17 ENCOUNTER — Encounter: Payer: Self-pay | Admitting: Gynecology

## 2012-03-17 DIAGNOSIS — N949 Unspecified condition associated with female genital organs and menstrual cycle: Secondary | ICD-10-CM

## 2012-03-17 DIAGNOSIS — N390 Urinary tract infection, site not specified: Secondary | ICD-10-CM

## 2012-03-17 DIAGNOSIS — R32 Unspecified urinary incontinence: Secondary | ICD-10-CM

## 2012-03-17 LAB — URINALYSIS W MICROSCOPIC + REFLEX CULTURE
Bilirubin Urine: NEGATIVE
Casts: NONE SEEN
Crystals: NONE SEEN
Glucose, UA: NEGATIVE mg/dL
Hgb urine dipstick: NEGATIVE
Ketones, ur: NEGATIVE mg/dL
Nitrite: NEGATIVE
Protein, ur: NEGATIVE mg/dL
Specific Gravity, Urine: <1.005 — ABNORMAL LOW (ref 1.005–1.030)
Urobilinogen, UA: 0.2 mg/dL (ref 0.0–1.0)
pH: 5.5 (ref 5.0–8.0)

## 2012-03-17 MED ORDER — NITROFURANTOIN MONOHYD MACRO 100 MG PO CAPS: 100.0000 mg | ORAL_CAPSULE | Freq: Two times a day (BID) | ORAL | Status: DC

## 2012-03-17 MED ORDER — PHENAZOPYRIDINE HCL 200 MG PO TABS: 200.0000 mg | ORAL_TABLET | Freq: Three times a day (TID) | ORAL | Status: DC | PRN

## 2012-03-17 MED ORDER — FLUCONAZOLE 150 MG PO TABS: 150.0000 mg | ORAL_TABLET | Freq: Once | ORAL | Status: DC

## 2012-03-17 NOTE — Patient Instructions (Addendum)
Take antibiotics twice daily for 7 days Take diflucan at the beginning and end of antibiotic treatment

## 2012-03-17 NOTE — Progress Notes (Signed)
Patient presents with several day history of worsening urinary frequency/dysuria and now with urinary incontinence. Some lower, will discomfort with radiation to her back. No nausea vomiting diarrhea constipation fever chills. No history of this before.  Exam was [name] Asst. Spine straight without CVA tenderness Abdomen soft with mild suprapubic tenderness no rebound guarding masses or organomegaly Pelvic external BUS vagina with white discharge. Bimanual mild suprapubic tenderness no masses.  Assessment and plan: Urinalysis symptoms and exam consistent with UTI. Treat with Macrobid 100 twice a day x7 days. Pyridium 200 3 times a day times several days as needed. Urinalysis also shows yeast and clue cells. I'm going to cover with Diflucan 150 mg now and at the end of her antibiotic treatment. Follow up if symptoms persist or recur.

## 2012-03-19 LAB — URINE CULTURE: Colony Count: 85000

## 2012-04-27 ENCOUNTER — Other Ambulatory Visit: Payer: Self-pay | Admitting: Gynecology

## 2012-04-28 NOTE — Telephone Encounter (Signed)
Called in the RX. KW

## 2012-05-02 ENCOUNTER — Telehealth: Payer: Self-pay | Admitting: *Deleted

## 2012-05-02 NOTE — Telephone Encounter (Signed)
Please tell patient that we will stop prescribing the Xanax as the [facility] called [place] and said there are multiple prescribers and they would prefer to be the only one managing her anxiolytic medication.

## 2012-05-02 NOTE — Telephone Encounter (Signed)
Received a call from [place] at [facility], it appears this patient is getting Xanax prescription from them and [place] and other providers. Please advise. Should I call her most recent pharmacy refill and cancel the refills? Please advise and I can put a note in system not to give.

## 2012-05-04 NOTE — Telephone Encounter (Signed)
I spoke with Patient and she is fully aware of not being able to get prescriptions from [place]. I did cancel the refills with Fayrene Fearing at [address] KW CMA

## 2012-05-24 HISTORY — PX: ESOPHAGOGASTRODUODENOSCOPY: SHX1529

## 2012-07-19 ENCOUNTER — Encounter: Payer: BC Managed Care – PPO | Admitting: Gynecology

## 2012-08-01 ENCOUNTER — Ambulatory Visit (INDEPENDENT_AMBULATORY_CARE_PROVIDER_SITE_OTHER): Payer: BC Managed Care – PPO | Admitting: Gynecology

## 2012-08-01 ENCOUNTER — Encounter: Payer: Self-pay | Admitting: Gynecology

## 2012-08-01 VITALS — BP 130/84 | Ht 67.0 in | Wt 153.0 lb

## 2012-08-01 DIAGNOSIS — Z13220 Encounter for screening for lipoid disorders: Secondary | ICD-10-CM

## 2012-08-01 DIAGNOSIS — Z01419 Encounter for gynecological examination (general) (routine) without abnormal findings: Secondary | ICD-10-CM

## 2012-08-01 LAB — CBC WITH DIFFERENTIAL/PLATELET
Basophils Absolute: 0.1 10*3/uL (ref 0.0–0.1)
Basophils Relative: 1 % (ref 0–1)
Eosinophils Absolute: 0 10*3/uL (ref 0.0–0.7)
Eosinophils Relative: 0 % (ref 0–5)
HCT: 33.2 % — ABNORMAL LOW (ref 36.0–46.0)
Hemoglobin: 10.8 g/dL — ABNORMAL LOW (ref 12.0–15.0)
Lymphocytes Relative: 33 % (ref 12–46)
Lymphs Abs: 1.7 10*3/uL (ref 0.7–4.0)
MCH: 28 pg (ref 26.0–34.0)
MCHC: 32.5 g/dL (ref 30.0–36.0)
MCV: 86 fL (ref 78.0–100.0)
Monocytes Absolute: 0.4 10*3/uL (ref 0.1–1.0)
Monocytes Relative: 9 % (ref 3–12)
Neutro Abs: 2.9 10*3/uL (ref 1.7–7.7)
Neutrophils Relative %: 57 % (ref 43–77)
Platelets: 360 10*3/uL (ref 150–400)
RBC: 3.86 MIL/uL — ABNORMAL LOW (ref 3.87–5.11)
RDW: 16.3 % — ABNORMAL HIGH (ref 11.5–15.5)
WBC: 5.1 10*3/uL (ref 4.0–10.5)

## 2012-08-01 LAB — LIPID PANEL
Cholesterol: 226 mg/dL — ABNORMAL HIGH (ref 0–200)
HDL: 122 mg/dL (ref >39)
LDL Cholesterol: 85 mg/dL (ref 0–99)
Total CHOL/HDL Ratio: 1.9 Ratio
Triglycerides: 97 mg/dL (ref <150)
VLDL: 19 mg/dL (ref 0–40)

## 2012-08-01 LAB — COMPREHENSIVE METABOLIC PANEL
ALT: 22 U/L (ref 0–35)
AST: 31 U/L (ref 0–37)
Albumin: 4.3 g/dL (ref 3.5–5.2)
Alkaline Phosphatase: 57 U/L (ref 39–117)
BUN: 13 mg/dL (ref 6–23)
CO2: 30 mEq/L (ref 19–32)
Calcium: 9.3 mg/dL (ref 8.4–10.5)
Chloride: 100 mEq/L (ref 96–112)
Creat: 0.67 mg/dL (ref 0.50–1.10)
Glucose, Bld: 90 mg/dL (ref 70–99)
Potassium: 4.3 mEq/L (ref 3.5–5.3)
Sodium: 136 mEq/L (ref 135–145)
Total Bilirubin: 0.3 mg/dL (ref 0.3–1.2)
Total Protein: 6.8 g/dL (ref 6.0–8.3)

## 2012-08-01 NOTE — Patient Instructions (Signed)
Follow up in one year, sooner if any issues 

## 2012-08-01 NOTE — Progress Notes (Signed)
Belanna Manring [birth year] [id number]        37 y.o.  G1P1001 for annual exam.  Doing well without complaints.  Past medical history,surgical history, medications, allergies, family history and social history were all reviewed and documented in the EPIC chart. ROS:  Was performed and pertinent positives and negatives are included in the history.  Exam: Kim assistant Filed Vitals:   08/01/12 1113  BP: 130/84  Height: 5\' 7"  (1.702 m)  Weight: 153 lb (69.4 kg)   General appearance  Normal Skin grossly normal Head/Neck normal with no cervical or supraclavicular adenopathy thyroid normal Lungs  clear Cardiac RR, without RMG Abdominal  soft, nontender, without masses, organomegaly or hernia Breasts  examined lying and sitting without masses, retractions, discharge or axillary adenopathy. Pelvic  Ext/BUS/vagina  normal    Adnexa  Without masses or tenderness    Anus and perineum  normal   Rectovaginal  normal sphincter tone without palpated masses or tenderness.    Assessment/Plan:  37 y.o. G35P1001 female for annual exam.   1. History of TLH 2012.  Doing well without complaints. No hormonal symptoms. 2. Mammography. Routine screening mammographic recommendations between 35 and 40. No strong family history of her first way closer to 26. SBE monthly reviewed. 3. Pap smear 2013. No Pap smear done today.  Does have a history of cryosurgery a number of years ago with normal intervening Pap smears. Current screening guidelines reviewed and options to stop screening altogether or less frequent intervals discussed. We'll readdress on an annual basis. 4. Health maintenance. Doing well from a seizure standpoint. Will check baseline CBC comprehensive metabolic panel lipid profile urinalysis. Followup in one year, sooner as needed.    Dara Lords MD, 11:59 AM 08/01/2012

## 2012-08-02 LAB — URINALYSIS W MICROSCOPIC + REFLEX CULTURE
Bacteria, UA: NONE SEEN
Bilirubin Urine: NEGATIVE
Casts: NONE SEEN
Crystals: NONE SEEN
Glucose, UA: NEGATIVE mg/dL
Hgb urine dipstick: NEGATIVE
Ketones, ur: NEGATIVE mg/dL
Leukocytes, UA: NEGATIVE
Nitrite: NEGATIVE
Protein, ur: NEGATIVE mg/dL
Specific Gravity, Urine: 1.015 (ref 1.005–1.030)
Squamous Epithelial / LPF: NONE SEEN
Urobilinogen, UA: 0.2 mg/dL (ref 0.0–1.0)
pH: 6 (ref 5.0–8.0)

## 2012-08-17 ENCOUNTER — Encounter: Payer: Self-pay | Admitting: Gynecology

## 2012-08-24 ENCOUNTER — Encounter (HOSPITAL_COMMUNITY): Payer: Self-pay | Admitting: Emergency Medicine

## 2012-08-24 ENCOUNTER — Emergency Department (HOSPITAL_COMMUNITY)
Admission: EM | Admit: 2012-08-24 | Discharge: 2012-08-24 | Disposition: A | Discharge status: Other Acute Inpatient Hospital | Payer: BC Managed Care – PPO | Attending: Emergency Medicine | Admitting: Emergency Medicine

## 2012-08-24 ENCOUNTER — Encounter (HOSPITAL_COMMUNITY): Payer: Self-pay

## 2012-08-24 ENCOUNTER — Inpatient Hospital Stay (HOSPITAL_COMMUNITY)
Admission: AD | Admit: 2012-08-24 | Discharge: 2012-08-28 | DRG: 430 | Disposition: A | Discharge status: Home / Self Care | Payer: BC Managed Care – PPO | Source: Intra-hospital | Attending: Psychiatry | Admitting: Psychiatry

## 2012-08-24 DIAGNOSIS — F988 Other specified behavioral and emotional disorders with onset usually occurring in childhood and adolescence: Secondary | ICD-10-CM

## 2012-08-24 DIAGNOSIS — T3991XA Poisoning by unspecified nonopioid analgesic, antipyretic and antirheumatic, accidental (unintentional), initial encounter: Secondary | ICD-10-CM | POA: Insufficient documentation

## 2012-08-24 DIAGNOSIS — F319 Bipolar disorder, unspecified: Secondary | ICD-10-CM | POA: Insufficient documentation

## 2012-08-24 DIAGNOSIS — Z8719 Personal history of other diseases of the digestive system: Secondary | ICD-10-CM | POA: Insufficient documentation

## 2012-08-24 DIAGNOSIS — F1020 Alcohol dependence, uncomplicated: Secondary | ICD-10-CM | POA: Diagnosis present

## 2012-08-24 DIAGNOSIS — I509 Heart failure, unspecified: Secondary | ICD-10-CM | POA: Insufficient documentation

## 2012-08-24 DIAGNOSIS — R45851 Suicidal ideations: Secondary | ICD-10-CM

## 2012-08-24 DIAGNOSIS — Z8679 Personal history of other diseases of the circulatory system: Secondary | ICD-10-CM | POA: Insufficient documentation

## 2012-08-24 DIAGNOSIS — K219 Gastro-esophageal reflux disease without esophagitis: Secondary | ICD-10-CM | POA: Diagnosis present

## 2012-08-24 DIAGNOSIS — T50902A Poisoning by unspecified drugs, medicaments and biological substances, intentional self-harm, initial encounter: Secondary | ICD-10-CM | POA: Insufficient documentation

## 2012-08-24 DIAGNOSIS — Z79899 Other long term (current) drug therapy: Secondary | ICD-10-CM | POA: Insufficient documentation

## 2012-08-24 DIAGNOSIS — Z9884 Bariatric surgery status: Secondary | ICD-10-CM | POA: Insufficient documentation

## 2012-08-24 DIAGNOSIS — F1010 Alcohol abuse, uncomplicated: Secondary | ICD-10-CM | POA: Insufficient documentation

## 2012-08-24 DIAGNOSIS — Z3202 Encounter for pregnancy test, result negative: Secondary | ICD-10-CM | POA: Insufficient documentation

## 2012-08-24 DIAGNOSIS — F411 Generalized anxiety disorder: Secondary | ICD-10-CM | POA: Diagnosis present

## 2012-08-24 DIAGNOSIS — T1491XA Suicide attempt, initial encounter: Secondary | ICD-10-CM

## 2012-08-24 DIAGNOSIS — G40909 Epilepsy, unspecified, not intractable, without status epilepticus: Secondary | ICD-10-CM

## 2012-08-24 DIAGNOSIS — Z87891 Personal history of nicotine dependence: Secondary | ICD-10-CM | POA: Insufficient documentation

## 2012-08-24 DIAGNOSIS — F3110 Bipolar disorder, current episode manic without psychotic features, unspecified: Principal | ICD-10-CM | POA: Diagnosis present

## 2012-08-24 DIAGNOSIS — R011 Cardiac murmur, unspecified: Secondary | ICD-10-CM | POA: Insufficient documentation

## 2012-08-24 HISTORY — DX: Suicide attempt, initial encounter: T14.91XA

## 2012-08-24 LAB — RAPID URINE DRUG SCREEN, HOSP PERFORMED
Amphetamines: NOT DETECTED
Barbiturates: NOT DETECTED
Benzodiazepines: NOT DETECTED
Cocaine: NOT DETECTED
Opiates: NOT DETECTED
Tetrahydrocannabinol: POSITIVE — AB

## 2012-08-24 LAB — CBC
HCT: 34.9 % — ABNORMAL LOW (ref 36.0–46.0)
Hemoglobin: 11.6 g/dL — ABNORMAL LOW (ref 12.0–15.0)
MCH: 27.7 pg (ref 26.0–34.0)
MCHC: 33.2 g/dL (ref 30.0–36.0)
MCV: 83.3 fL (ref 78.0–100.0)
Platelets: 409 10*3/uL — ABNORMAL HIGH (ref 150–400)
RBC: 4.19 MIL/uL (ref 3.87–5.11)
RDW: 15.1 % (ref 11.5–15.5)
WBC: 6.1 10*3/uL (ref 4.0–10.5)

## 2012-08-24 LAB — COMPREHENSIVE METABOLIC PANEL
ALT: 49 U/L — ABNORMAL HIGH (ref 0–35)
AST: 55 U/L — ABNORMAL HIGH (ref 0–37)
Albumin: 3.8 g/dL (ref 3.5–5.2)
Alkaline Phosphatase: 81 U/L (ref 39–117)
BUN: 11 mg/dL (ref 6–23)
CO2: 30 mEq/L (ref 19–32)
Calcium: 8.8 mg/dL (ref 8.4–10.5)
Chloride: 101 mEq/L (ref 96–112)
Creatinine, Ser: 0.64 mg/dL (ref 0.50–1.10)
GFR calc Af Amer: >90 mL/min (ref >90)
GFR calc non Af Amer: >90 mL/min (ref >90)
Glucose, Bld: 87 mg/dL (ref 70–99)
Potassium: 4.4 mEq/L (ref 3.5–5.1)
Sodium: 141 mEq/L (ref 135–145)
Total Bilirubin: 0.2 mg/dL — ABNORMAL LOW (ref 0.3–1.2)
Total Protein: 7.1 g/dL (ref 6.0–8.3)

## 2012-08-24 LAB — SALICYLATE LEVEL: Salicylate Lvl: <2 mg/dL — ABNORMAL LOW (ref 2.8–20.0)

## 2012-08-24 LAB — ETHANOL: Alcohol, Ethyl (B): 306 mg/dL — ABNORMAL HIGH (ref 0–11)

## 2012-08-24 LAB — ACETAMINOPHEN LEVEL: Acetaminophen (Tylenol), Serum: <15 ug/mL (ref 10–30)

## 2012-08-24 LAB — PREGNANCY, URINE: Preg Test, Ur: NEGATIVE

## 2012-08-24 MED ORDER — LISDEXAMFETAMINE DIMESYLATE 50 MG PO CAPS: 50.0000 mg | ORAL_CAPSULE | ORAL | Status: DC

## 2012-08-24 MED ORDER — FERROUS SULFATE 325 (65 FE) MG PO TABS
325.0000 mg | ORAL_TABLET | Freq: Every day | ORAL | Status: DC
  Administered 2012-08-24: 325 mg via ORAL
  Filled 2012-08-24 (×2): qty 1

## 2012-08-24 MED ORDER — LISDEXAMFETAMINE DIMESYLATE 20 MG PO CAPS
20.0000 mg | ORAL_CAPSULE | ORAL | Status: DC
  Administered 2012-08-24: 20 mg via ORAL

## 2012-08-24 MED ORDER — ALUM & MAG HYDROXIDE-SIMETH 200-200-20 MG/5ML PO SUSP
30.0000 mL | Freq: Once | ORAL | Status: AC
  Administered 2012-08-24: 30 mL via ORAL
  Filled 2012-08-24: qty 30

## 2012-08-24 MED ORDER — FERROUS SULFATE 325 (65 FE) MG PO TABS
325.0000 mg | ORAL_TABLET | Freq: Every day | ORAL | Status: DC
  Administered 2012-08-25 – 2012-08-28 (×4): 325 mg via ORAL
  Filled 2012-08-24 (×5): qty 1

## 2012-08-24 MED ORDER — ALUM & MAG HYDROXIDE-SIMETH 200-200-20 MG/5ML PO SUSP: 30.0000 mL | ORAL | Status: DC | PRN

## 2012-08-24 MED ORDER — GABAPENTIN 300 MG PO CAPS
300.0000 mg | ORAL_CAPSULE | Freq: Every day | ORAL | Status: DC
  Administered 2012-08-25 – 2012-08-28 (×4): 300 mg via ORAL
  Filled 2012-08-24 (×5): qty 1

## 2012-08-24 MED ORDER — ONDANSETRON HCL 4 MG/2ML IJ SOLN
4.0000 mg | Freq: Once | INTRAMUSCULAR | Status: AC
  Administered 2012-08-24: 4 mg via INTRAVENOUS

## 2012-08-24 MED ORDER — GABAPENTIN 300 MG PO CAPS
600.0000 mg | ORAL_CAPSULE | Freq: Every day | ORAL | Status: DC
  Filled 2012-08-24: qty 2

## 2012-08-24 MED ORDER — MAGNESIUM HYDROXIDE 400 MG/5ML PO SUSP: 30.0000 mL | Freq: Every day | ORAL | Status: DC | PRN

## 2012-08-24 MED ORDER — CHLORDIAZEPOXIDE HCL 25 MG PO CAPS: 25.0000 mg | ORAL_CAPSULE | Freq: Four times a day (QID) | ORAL | Status: DC | PRN

## 2012-08-24 MED ORDER — ONDANSETRON HCL 4 MG PO TABS
4.0000 mg | ORAL_TABLET | Freq: Three times a day (TID) | ORAL | Status: DC | PRN
  Administered 2012-08-24: 4 mg via ORAL
  Filled 2012-08-24: qty 1

## 2012-08-24 MED ORDER — PANTOPRAZOLE SODIUM 40 MG PO TBEC: 40.0000 mg | DELAYED_RELEASE_TABLET | Freq: Every day | ORAL | Status: DC

## 2012-08-24 MED ORDER — GABAPENTIN 300 MG PO CAPS
300.0000 mg | ORAL_CAPSULE | Freq: Every day | ORAL | Status: DC
  Administered 2012-08-24: 300 mg via ORAL
  Filled 2012-08-24: qty 1

## 2012-08-24 MED ORDER — LISDEXAMFETAMINE DIMESYLATE 30 MG PO CAPS
30.0000 mg | ORAL_CAPSULE | ORAL | Status: DC
  Administered 2012-08-24: 30 mg via ORAL

## 2012-08-24 MED ORDER — PANTOPRAZOLE SODIUM 40 MG PO TBEC
40.0000 mg | DELAYED_RELEASE_TABLET | Freq: Every day | ORAL | Status: DC
  Administered 2012-08-24 – 2012-08-27 (×4): 40 mg via ORAL
  Filled 2012-08-24 (×6): qty 1

## 2012-08-24 MED ORDER — ONDANSETRON HCL 4 MG PO TABS: 4.0000 mg | ORAL_TABLET | Freq: Three times a day (TID) | ORAL | Status: DC | PRN

## 2012-08-24 MED ORDER — LAMOTRIGINE 25 MG PO TBDP: 200.0000 mg | ORAL_TABLET | Freq: Every morning | ORAL | Status: DC

## 2012-08-24 MED ORDER — ONDANSETRON HCL 4 MG/2ML IJ SOLN
4.0000 mg | Freq: Once | INTRAMUSCULAR | Status: DC
Start: 2012-08-24 — End: 2012-08-24
  Filled 2012-08-24: qty 2

## 2012-08-24 MED ORDER — GABAPENTIN 300 MG PO CAPS
600.0000 mg | ORAL_CAPSULE | Freq: Every day | ORAL | Status: DC
  Administered 2012-08-24 – 2012-08-27 (×4): 600 mg via ORAL
  Filled 2012-08-24 (×6): qty 2

## 2012-08-24 MED ORDER — TRAZODONE HCL 50 MG PO TABS
50.0000 mg | ORAL_TABLET | Freq: Every evening | ORAL | Status: DC | PRN
  Administered 2012-08-24 – 2012-08-27 (×7): 50 mg via ORAL
  Filled 2012-08-24 (×7): qty 1

## 2012-08-24 MED ORDER — HYDROXYZINE HCL 25 MG PO TABS
25.0000 mg | ORAL_TABLET | Freq: Four times a day (QID) | ORAL | Status: DC | PRN
  Administered 2012-08-25 – 2012-08-28 (×3): 25 mg via ORAL

## 2012-08-24 MED ORDER — CARBAMAZEPINE ER 400 MG PO TB12
400.0000 mg | ORAL_TABLET | Freq: Two times a day (BID) | ORAL | Status: DC
  Administered 2012-08-24: 400 mg via ORAL
  Filled 2012-08-24 (×2): qty 1

## 2012-08-24 MED ORDER — LORAZEPAM 1 MG PO TABS
1.0000 mg | ORAL_TABLET | Freq: Three times a day (TID) | ORAL | Status: DC | PRN
  Filled 2012-08-24: qty 1

## 2012-08-24 MED ORDER — LAMOTRIGINE 200 MG PO TABS
200.0000 mg | ORAL_TABLET | Freq: Every day | ORAL | Status: DC
  Administered 2012-08-24: 200 mg via ORAL
  Filled 2012-08-24: qty 1

## 2012-08-24 NOTE — ED Provider Notes (Addendum)
History     CSN: [id number]  Arrival date & time 08/24/12  7829   First MD Initiated Contact with Patient 08/24/12 [phone]      Chief Complaint  Patient presents with  . Suicidal  . Drug Overdose    HPI The patient reports she drank 2 bottles of wine this evening and took 500 mg of carprofen.  She said she did this in attempt to kill her self.  She states she is gay this is difficult for her family to accept given her religious believes.  The patient's wife was there and states she's been in a normal mood all week.  The patient has history bipolar disorder.  The patient denies prior suicide attempts.  The patient's wife states that she does not drink heavily.  She currently is on leave of absence from work.   Past Medical History  Diagnosis Date  . GERD (gastroesophageal reflux disease)   . Hiatal hernia   . CHF (congestive heart failure) 2001    post partum cardiomyopathy - resolved  . PONV (postoperative nausea and vomiting)   . Cardiomyopathy     POST PARTUM  . Heart murmur     funtional  . Epileptic seizures 05/2011  . ADD (attention deficit disorder)     Past Surgical History  Procedure Laterality Date  . Gastric bypass  2010  . Tubal ligation  2001  . Cholecystectomy  2001  . Laparoscopic total hysterectomy  7.16.2012    endometriosis on fallopian tubes    Family History  Problem Relation Age of Onset  . Hypertension Mother   . Hypertension Father   . Diabetes Paternal Grandmother   . Cancer Paternal Grandmother     OV/UT  . Breast cancer Cousin 34    MATERNAL  . Breast cancer Maternal Grandmother 72    History  Substance Use Topics  . Smoking status: Former Smoker    Types: Cigarettes    Quit date: 11/30/1998  . Smokeless tobacco: Never Used  . Alcohol Use: Yes     Comment: occassionally    OB History   Grav Para Term Preterm Abortions TAB SAB Ect Mult Living   1 1 1       1       Review of Systems  All other systems reviewed and are  negative.    Allergies  Hydrocodone; Nsaids; Vicodin; and Xanax  Home Medications   Current Outpatient Rx  Name  Route  Sig  Dispense  Refill  . acetaminophen (TYLENOL) 325 MG tablet   Oral   Take 325 mg by mouth as needed.           . Calcium Citrate-Vitamin D (CALCIUM CITRATE + PO)   Oral   Take 500 mg by mouth daily.          . Carbamazepine, Antipsychotic, (EQUETRO) 300 MG CP12   Oral   Take by mouth.         . Cyanocobalamin (VITAMIN B-12 CR PO)   Oral   Take by mouth.         . Gabapentin (NEURONTIN PO)   Oral   Take by mouth.         . LamoTRIgine (LAMICTAL ODT) 200 MG TBDP   Oral   Take 400 mg by mouth.          . lisdexamfetamine (VYVANSE) 50 MG capsule   Oral   Take 50 mg by mouth every morning.          Marland Kitchen  Multiple Vitamin (MULTIVITAMIN) tablet   Oral   Take 1 tablet by mouth daily.           . pantoprazole (PROTONIX) 40 MG tablet   Oral   Take 40 mg by mouth at bedtime.           . polyethylene glycol (MIRALAX / GLYCOLAX) packet   Oral   Take 17 g by mouth daily.             BP 137/82  Pulse 96  Temp(Src) 97.7 F (36.5 C) (Oral)  Resp 16  SpO2 99%  LMP 11/18/2010  Physical Exam  Nursing note and vitals reviewed. Constitutional: She is oriented to person, place, and time. She appears well-developed and well-nourished. No distress.  HENT:  Head: Normocephalic and atraumatic.  Eyes: EOM are normal.  Neck: Normal range of motion.  Cardiovascular: Normal rate, regular rhythm and normal heart sounds.   Pulmonary/Chest: Effort normal and breath sounds normal.  Abdominal: Soft. She exhibits no distension. There is no tenderness.  Musculoskeletal: Normal range of motion.  Neurological: She is alert and oriented to person, place, and time.  Skin: Skin is warm and dry.  Psychiatric: Her speech is not rapid and/or pressured. She exhibits a depressed mood. She expresses suicidal ideation. She expresses suicidal plans.   tearful    ED Course  Procedures (including critical care time)   Date: 08/24/2012  Rate: 87  Rhythm: normal sinus rhythm  QRS Axis: normal  Intervals: normal  ST/T Wave abnormalities: normal  Conduction Disutrbances: none  Narrative Interpretation:   Old EKG Reviewed: no prior ecg      Labs Reviewed  CBC - Abnormal; Notable for the following:    Hemoglobin 11.6 (*)    HCT 34.9 (*)    Platelets 409 (*)    All other components within normal limits  URINE RAPID DRUG SCREEN (HOSP PERFORMED) - Abnormal; Notable for the following:    Tetrahydrocannabinol POSITIVE (*)    All other components within normal limits  PREGNANCY, URINE  COMPREHENSIVE METABOLIC PANEL  SALICYLATE LEVEL  ETHANOL  ACETAMINOPHEN LEVEL   No results found.   No diagnosis found.    MDM  The patient will need to sober in the emergency department and then when she is more sober she'll need a psychiatric consultation.  Her ingestion of 500 mg of NSAID is unlikely to cause any issues.  Maalox for her gastric upset at this time.  Doubt go ingestion.  Salicylate and Tylenol levels pending.  Medical clearance labs.   Lyanne Co, MD 08/24/12 0411   Date: 08/24/2012  Rate: 87  Rhythm: normal sinus rhythm  QRS Axis: normal  Intervals: normal  ST/T Wave abnormalities: normal  Conduction Disutrbances: none  Narrative Interpretation:   Old EKG Reviewed: No significant changes noted     Lyanne Co, MD 08/24/12 0548  6:39 AM Patient seen and evaluated by the psychiatrist recommends admission to a psychiatric unit for bipolar disorder for suicidal ideation  Lyanne Co, MD 08/24/12 [phone]

## 2012-08-24 NOTE — ED Notes (Signed)
Visitor in room ?

## 2012-08-24 NOTE — ED Notes (Signed)
RUE:AVWU<JW> Expected date:<BR> Expected time:<BR> Means of arrival:<BR> Comments:<BR> EMS/overdose/SI

## 2012-08-24 NOTE — ED Notes (Signed)
Ekg given to Dr.Campos  

## 2012-08-24 NOTE — BH Assessment (Signed)
Assessment Note   Ana Rose is an 37 y.o. female. Patient presents to [facility] via EMS. The EMS was called by her partner after patient reportedly overdoses. Per EDP, notes patient took 500 mg's of Carprofen. She told EMS that she was trying to kill herself. She also told ED staff that she is gay and this is difficulty for her family to accept given her religous background and beliefs.   This Clinical research associate assessed patient and during the assessment today she denied SI stating she wanted to go home. Patient would not further discuss the reported suicide attempt. She stated that she did not know what she overdosed on, how much she consumed, if the reported medications was her prescribed medications, or if they belonged to someone else. She did not identify any triggers during the assessment. Patient was not forthcoming with her history or any information during the assessment. Patient did not confirm or deny if she was currently suicidal only stating, "I don't know" when asked. She did not contract for safety again stating, "I don't know if I can".  Her affect is flat. She is calm and cooperative, however; her mood is depressed and sad. She reports a poor appetite and no sleep for the past 3 days. She denies prior suicide attempts/gestures.  Patient denies HI. She denies AVH's. She also denies drug use but reports heavy alcohol use for the past 3 yrs. She reported drinking 2 bottles of wine daily for 3 yrs. Her last use was last night and she reports drinking 2 bottles of wine. She has not received any inpatient treatment for substance abuse or mental health issues. She has a outpatient psychiatrist Dr. Hulen Skains and therapist Leanne Lovely. Due to patients suicide attempt by overdosing she will remain in the ED for placement at a inpatient facility for stabilization.   *IVC papers completed.    Axis I: Major Depressive Disorder, Severe, Single Episode Axis II: Deferred  Axis III:  Past Medical History  Diagnosis Date   . GERD (gastroesophageal reflux disease)   . Hiatal hernia   . CHF (congestive heart failure) 2001    post partum cardiomyopathy - resolved  . PONV (postoperative nausea and vomiting)   . Cardiomyopathy     POST PARTUM  . Heart murmur     funtional  . Epileptic seizures 05/2011  . ADD (attention deficit disorder)    Axis IV: other psychosocial or environmental problems, problems related to social environment and problems with primary support group Axis V: 31-40 impairment in reality testing  Past Medical History:  Past Medical History  Diagnosis Date  . GERD (gastroesophageal reflux disease)   . Hiatal hernia   . CHF (congestive heart failure) 2001    post partum cardiomyopathy - resolved  . PONV (postoperative nausea and vomiting)   . Cardiomyopathy     POST PARTUM  . Heart murmur     funtional  . Epileptic seizures 05/2011  . ADD (attention deficit disorder)     Past Surgical History  Procedure Laterality Date  . Gastric bypass  2010  . Tubal ligation  2001  . Cholecystectomy  2001  . Laparoscopic total hysterectomy  7.16.2012    endometriosis on fallopian tubes    Family History:  Family History  Problem Relation Age of Onset  . Hypertension Mother   . Hypertension Father   . Diabetes Paternal Grandmother   . Cancer Paternal Grandmother     OV/UT  . Breast cancer Cousin 36  MATERNAL  . Breast cancer Maternal Grandmother 24    Social History:  reports that she quit smoking about 13 years ago. Her smoking use included Cigarettes. She smoked 0.00 packs per day. She has never used smokeless tobacco. She reports that  drinks alcohol. She reports that she does not use illicit drugs.  Additional Social History:  Alcohol / Drug Use Pain Medications: SEE MAR Prescriptions: SEE MAR Over the Counter: SEE MAR History of alcohol / drug use?: Yes Longest period of sobriety (when/how long): unk Negative Consequences of Use: Personal relationships Substance  #1 Name of Substance 1: Alcohol-wine 1 - Age of First Use: 20 1 - Amount (size/oz): 2 bottles of wine per day 1 - Frequency: daily  1 - Duration: 3 yrs daily  1 - Last Use / Amount: 08/23/2012 "last night"  CIWA: CIWA-Ar BP: 109/64 mmHg Pulse Rate: 84 COWS:    Allergies:  Allergies  Allergen Reactions  . Hydrocodone Nausea And Vomiting  . Nsaids Other (See Comments)    Oral NSAIDS contraindicated due to Gastric Bypass  . Vicodin (Hydrocodone-Acetaminophen) Nausea And Vomiting  . Xanax (Alprazolam)     Home Medications:  (Not in a hospital admission)  OB/GYN Status:  Patient's last menstrual period was 11/18/2010.  General Assessment Data Location of Assessment: [facility] ED Living Arrangements: Other (Comment);Children;Spouse/significant other (pt lives with partner and daughter) Can pt return to current living arrangement?: Yes Admission Status: Involuntary Is patient capable of signing voluntary admission?: No Transfer from: Acute Hospital Referral Source: Self/Family/Friend  Education Status Is patient currently in school?: No  Risk to self Suicidal Ideation: Yes-Currently Present Suicidal Intent: Yes-Currently Present Is patient at risk for suicide?: Yes Suicidal Plan?: Yes-Currently Present Specify Current Suicidal Plan:  (patient overdosed prior to arrival & drank 2 bottles of wine) Access to Means: Yes Specify Access to Suicidal Means:  (Patient has access to medications and alcohol) What has been your use of drugs/alcohol within the last 12 months?:  (patient reports a 3 yr history of alcohol abuse) Previous Attempts/Gestures: No How many times?:  (no previous attempts/gestures, per patient) Other Self Harm Risks:  (n/a) Triggers for Past Attempts:  (no previous attempts/gestures noted) Intentional Self Injurious Behavior: None Family Suicide History: Yes (mother & father have mental illness;pt  doesn't spec) Recent stressful life event(s): Other (Comment)  (patient denies having an stressors or triggers ) Persecutory voices/beliefs?: No Depression: No Depression Symptoms:  (patient denies depression, however; appears very withdrawn) Substance abuse history and/or treatment for substance abuse?: No Suicide prevention information given to non-admitted patients: Not applicable  Risk to Others Homicidal Ideation: No Thoughts of Harm to Others: No Current Homicidal Intent: No Current Homicidal Plan: No Access to Homicidal Means: No Identified Victim:  (n/a) History of harm to others?: No Assessment of Violence: None Noted Violent Behavior Description:  (patient calm and cooperative) Does patient have access to weapons?: No Criminal Charges Pending?: No Does patient have a court date: No  Psychosis Hallucinations: None noted Delusions: None noted  Mental Status Report Appear/Hygiene: Disheveled Eye Contact: Fair Motor Activity: Freedom of movement Speech: Logical/coherent Level of Consciousness: Alert Mood: Depressed Affect: Appropriate to circumstance Anxiety Level: Minimal Thought Processes: Coherent;Relevant Judgement: Impaired Orientation: Person;Place;Time;Situation Obsessive Compulsive Thoughts/Behaviors: None  Cognitive Functioning Concentration: Decreased Memory: Recent Intact;Remote Intact IQ: Average Insight: Poor Impulse Control: Poor Appetite: Poor Weight Loss:  (n/a) Weight Gain:  (n/a) Sleep: Decreased Total Hours of Sleep:  (patient sts she hasn't been sleep in 3 days) Vegetative  Symptoms: None  ADLScreening [facility]) Patient's cognitive ability adequate to safely complete daily activities?: Yes Patient able to express need for assistance with ADLs?: Yes Independently performs ADLs?: Yes (appropriate for developmental age)  Abuse/Neglect [facility]) Physical Abuse: Denies Verbal Abuse: Denies Sexual Abuse: Denies  Prior Inpatient Therapy Prior Inpatient Therapy: No Prior Therapy Dates:   (n/a) Prior Therapy Facilty/Provider(s):  (n/a) Reason for Treatment:  (n/a)  Prior Outpatient Therapy Prior Outpatient Therapy: Yes Prior Therapy Dates:  (currently at patient of Dr. Akin-psychiatrist) Prior Therapy Facilty/Provider(s):  (Dr. Akin-psychiatrist and Romeo Apple Bently-therapist) Reason for Treatment:  (depression and med managment)  ADL Screening (condition at time of admission) Patient's cognitive ability adequate to safely complete daily activities?: Yes Patient able to express need for assistance with ADLs?: Yes Independently performs ADLs?: Yes (appropriate for developmental age) Weakness of Legs: None Weakness of Arms/Hands: None  Home Assistive Devices/Equipment Home Assistive Devices/Equipment: None  Therapy Consults (therapy consults require a physician order) PT Evaluation Needed: No OT Evalulation Needed: No SLP Evaluation Needed: No Abuse/Neglect Assessment (Assessment to be complete while patient is alone) Physical Abuse: Denies Verbal Abuse: Denies Sexual Abuse: Denies Exploitation of patient/patient's resources: Denies Self-Neglect: Denies Values / Beliefs Cultural Requests During Hospitalization: None Spiritual Requests During Hospitalization: None   Advance Directives (For Healthcare) Advance Directive: Patient does not have advance directive Nutrition Screen- [facility] Adult/[facility] Patient's home diet: Regular  Additional Information 1:1 In Past 12 Months?: No CIRT Risk: No Elopement Risk: No Does patient have medical clearance?: Yes     Disposition:  Disposition Initial Assessment Completed for this Encounter: Yes Disposition of Patient: Inpatient treatment program;Referred to [facility] and [facility]) Type of inpatient treatment program: Adult  On Site Evaluation by:   Reviewed with Physician:     Melynda Ripple [facility] 08/24/2012 11:56 AM

## 2012-08-24 NOTE — Tx Team (Signed)
Initial Interdisciplinary Treatment Plan  PATIENT STRENGTHS: (choose at least two) Ability for insight General fund of knowledge Motivation for treatment/growth Supportive family/friends  PATIENT STRESSORS: Financial difficulties Medication change or noncompliance Substance abuse   PROBLEM LIST: Problem List/Patient Goals Date to be addressed Date deferred Reason deferred Estimated date of resolution  Substance Abuse      Suicidal Ideation      Depression                                           DISCHARGE CRITERIA:  Medical problems require only outpatient monitoring Motivation to continue treatment in a less acute level of care Reduction of life-threatening or endangering symptoms to within safe limits  PRELIMINARY DISCHARGE PLAN: Attend PHP/IOP Outpatient therapy  PATIENT/FAMIILY INVOLVEMENT: This treatment plan has been presented to and reviewed with the patient, Arnetha Silverthorne, and/or family member, .  The patient and family have been given the opportunity to ask questions and make suggestions.  Noah Charon 08/24/2012, 5:13 PM

## 2012-08-24 NOTE — BHH Counselor (Signed)
Patient accepted to [facility] by Dr. Elsie Saas to Dr. Geoffery Lyons. The room # is 303-1. Support paperwork completed and faxed to [facility]. EDP-Dr. Consuello Closs made aware of patients disposition. Patients nurse-Sheila also made aware. Patient is under IVC and will be transferred via [facility] to [facility].

## 2012-08-24 NOTE — Progress Notes (Signed)
Patient ID: Ana Rose, female   DOB: [birth year], 37 y.o.   MRN: [id number] D: Patient presented with depressed mood and flat affect.  Pt has been isolative in room with minimal interaction with staff and peers. Pt endorses passive suicidal ideation but contracts for safety. Pt denies HI/AVH and pain.  Cooperative with assessment. No acute distressed noted at this time.   A: Met with pt 1:1. Medications administered as prescribed. Writer encouraged pt to discuss feelings. Pt encouraged to come to staff with any question or concerns. 15 minutes checks for safety.  R: Patient remains safe. She is complaint with medications. Continue current POC.

## 2012-08-24 NOTE — ED Notes (Signed)
Brought in by EMS after pt's girlfriend called EMS d/t pt's "abnormal behavior".  Per EMS, pt drank a couple of bottle of wine with "5 tablets" of NSAIDs; pt is gay and has trouble coming out to her parents--- making her very depressed; pt arrived to ED room drowsy but arouseable.

## 2012-08-24 NOTE — ED Notes (Signed)
Telepsych consult in progress

## 2012-08-24 NOTE — Consult Note (Signed)
Reason for Consult: depression, suicidal attempt while intoxicated with alcohol Referring Physician: Dr. Ocie Doyne Ana Rose is an 37 y.o. female.  HPI: patient was presented with depression, anxiety and suicidal attempt. Patient stated that she drank two bottles of wine and and took 6 or 7 pills of 500 mg of carprofen (pet antiinflammatory drug). She said she did this in an attempt to kill her self. She states she is gay this is difficult for her family to accept given her religious believes. The patient's  partner was there and contacted EMS and patient parents who are taking care of her 18 years old daughter at this time. The patient has history bipolar disorder and denied psychiatric treatment. The patient denies prior suicide attempts. She works for [employer] and she currently is on leave of absence from work. She has minimized her suicidal attempt saying she was drunk and stupid and feels she can heal her self at home. Tele-psychiatrist who evaluated recommended acute psychiatric hospitalization which I agree with it. She needs to be IVC'ed due to lack of insight into her emotional and substance abuse and suicidal attempt and poor judgment.  MSE: Patient was in her bed, with anxious mood and worried about her daughter social appointments. She has dysphoric mood and normal speech. She has regrets about her suicidal attempt. She has no homicidal ideations, intention and plans. She has no evidence of psychosis.She has poor insight, judgment and impulse control.    Past Medical History  Diagnosis Date  . GERD (gastroesophageal reflux disease)   . Hiatal hernia   . CHF (congestive heart failure) 2001    post partum cardiomyopathy - resolved  . PONV (postoperative nausea and vomiting)   . Cardiomyopathy     POST PARTUM  . Heart murmur     funtional  . Epileptic seizures 05/2011  . ADD (attention deficit disorder)     Past Surgical History  Procedure Laterality Date  .  Gastric bypass  2010  . Tubal ligation  2001  . Cholecystectomy  2001  . Laparoscopic total hysterectomy  7.16.2012    endometriosis on fallopian tubes    Family History  Problem Relation Age of Onset  . Hypertension Mother   . Hypertension Father   . Diabetes Paternal Grandmother   . Cancer Paternal Grandmother     OV/UT  . Breast cancer Cousin 34    MATERNAL  . Breast cancer Maternal Grandmother 52    Social History:  reports that she quit smoking about 13 years ago. Her smoking use included Cigarettes. She smoked 0.00 packs per day. She has never used smokeless tobacco. She reports that  drinks alcohol. She reports that she does not use illicit drugs.  Allergies:  Allergies  Allergen Reactions  . Hydrocodone Nausea And Vomiting  . Nsaids Other (See Comments)    Oral NSAIDS contraindicated due to Gastric Bypass  . Vicodin (Hydrocodone-Acetaminophen) Nausea And Vomiting  . Xanax (Alprazolam)     Medications: I have reviewed the patient's current medications.  Results for orders placed during the hospital encounter of 08/24/12 (from the past 48 hour(s))  URINE RAPID DRUG SCREEN (HOSP PERFORMED)     Status: Abnormal   Collection Time    08/24/12  3:35 AM      Result Value Range   Opiates NONE DETECTED  NONE DETECTED   Cocaine NONE DETECTED  NONE DETECTED   Benzodiazepines NONE DETECTED  NONE DETECTED   Amphetamines NONE DETECTED  NONE DETECTED  Tetrahydrocannabinol POSITIVE (*) NONE DETECTED   Barbiturates NONE DETECTED  NONE DETECTED   Comment:            DRUG SCREEN FOR MEDICAL PURPOSES     ONLY.  IF CONFIRMATION IS NEEDED     FOR ANY PURPOSE, NOTIFY LAB     WITHIN 5 DAYS.                LOWEST DETECTABLE LIMITS     FOR URINE DRUG SCREEN     Drug Class       Cutoff (ng/mL)     Amphetamine      1000     Barbiturate      200     Benzodiazepine   200     Tricyclics       300     Opiates          300     Cocaine          300     THC              50   PREGNANCY, URINE     Status: None   Collection Time    08/24/12  3:35 AM      Result Value Range   Preg Test, Ur NEGATIVE  NEGATIVE   Comment:            THE SENSITIVITY OF THIS     METHODOLOGY IS >20 mIU/mL.  CBC     Status: Abnormal   Collection Time    08/24/12  3:42 AM      Result Value Range   WBC 6.1  4.0 - 10.5 K/uL   RBC 4.19  3.87 - 5.11 MIL/uL   Hemoglobin 11.6 (*) 12.0 - 15.0 g/dL   HCT 16.1 (*) 09.6 - 04.5 %   MCV 83.3  78.0 - 100.0 fL   MCH 27.7  26.0 - 34.0 pg   MCHC 33.2  30.0 - 36.0 g/dL   RDW 40.9  81.1 - 91.4 %   Platelets 409 (*) 150 - 400 K/uL  COMPREHENSIVE METABOLIC PANEL     Status: Abnormal   Collection Time    08/24/12  3:42 AM      Result Value Range   Sodium 141  135 - 145 mEq/L   Potassium 4.4  3.5 - 5.1 mEq/L   Chloride 101  96 - 112 mEq/L   CO2 30  19 - 32 mEq/L   Glucose, Bld 87  70 - 99 mg/dL   BUN 11  6 - 23 mg/dL   Creatinine, Ser 7.82  0.50 - 1.10 mg/dL   Calcium 8.8  8.4 - 95.6 mg/dL   Total Protein 7.1  6.0 - 8.3 g/dL   Albumin 3.8  3.5 - 5.2 g/dL   AST 55 (*) 0 - 37 U/L   ALT 49 (*) 0 - 35 U/L   Alkaline Phosphatase 81  39 - 117 U/L   Total Bilirubin 0.2 (*) 0.3 - 1.2 mg/dL   GFR calc non Af Amer >90  >90 mL/min   GFR calc Af Amer >90  >90 mL/min   Comment:            The eGFR has been calculated     using the CKD EPI equation.     This calculation has not been     validated in all clinical     situations.     eGFR's persistently     <  90 mL/min signify     possible Chronic Kidney Disease.  SALICYLATE LEVEL     Status: Abnormal   Collection Time    08/24/12  3:42 AM      Result Value Range   Salicylate Lvl <2.0 (*) 2.8 - 20.0 mg/dL  ETHANOL     Status: Abnormal   Collection Time    08/24/12  3:42 AM      Result Value Range   Alcohol, Ethyl (B) 306 (*) 0 - 11 mg/dL   Comment:            LOWEST DETECTABLE LIMIT FOR     SERUM ALCOHOL IS 11 mg/dL     FOR MEDICAL PURPOSES ONLY  ACETAMINOPHEN LEVEL     Status: None    Collection Time    08/24/12  3:42 AM      Result Value Range   Acetaminophen (Tylenol), Serum <15.0  10 - 30 ug/mL   Comment:            THERAPEUTIC CONCENTRATIONS VARY     SIGNIFICANTLY. A RANGE OF 10-30     ug/mL MAY BE AN EFFECTIVE     CONCENTRATION FOR MANY PATIENTS.     HOWEVER, SOME ARE BEST TREATED     AT CONCENTRATIONS OUTSIDE THIS     RANGE.     ACETAMINOPHEN CONCENTRATIONS     >150 ug/mL AT 4 HOURS AFTER     INGESTION AND >50 ug/mL AT 12     HOURS AFTER INGESTION ARE     OFTEN ASSOCIATED WITH TOXIC     REACTIONS.    No results found.  Positive for anxiety, bad mood, behavior problems, bipolar, depression, excessive alcohol consumption, illegal drug usage, mood swings, sleep disturbance and relationship problems. Blood pressure 109/64, pulse 84, temperature 97.4 F (36.3 C), temperature source Oral, resp. rate 13, last menstrual period 11/18/2010, SpO2 98.00%.   Assessment/Plan: Bipolar disorder, MRE depression Alcohol intoxication ( Elevated AST and ALT and BAL 306 ) Cannabis abuse Relationship problems  Recommendation: Patient will be referred to [facility] for crisis stabilization. Safety, secure therapeutic milieu and medication management and therapies.    Blessings Inglett,JANARDHAHA R. 08/24/2012, 11:42 AM

## 2012-08-24 NOTE — Progress Notes (Signed)
Patient quiet but cooperative during admission assessment. Patient verbalizes "I drank too much and took the dogs medication." Patient has been attending IOP at [facility] and her medications were recently adjusted. Patient denies SI/HI at this time. Patient denies AVH. Patient states "I have been drinking about 2 bottles of wine a night, I hide my drinking from my partner."  Patient verbalizes "I am ready to stop drinking." Patient stressor "I am gay but I have been hiding it all my life but I am really happy with my partner of 7 months, my parents are [place] [employer] and I am afraid of how they will react." Patient informed of fall risk status, fall risk assessed "low" at this time. Patient oriented to unit/staff/room. Patient denies any questions/concerns at this time. Patient safe on unit with Q15 minute checks for safety. Will continue to monitor.

## 2012-08-25 ENCOUNTER — Encounter (HOSPITAL_COMMUNITY): Payer: Self-pay | Admitting: Psychiatry

## 2012-08-25 DIAGNOSIS — F1020 Alcohol dependence, uncomplicated: Secondary | ICD-10-CM | POA: Diagnosis present

## 2012-08-25 DIAGNOSIS — F319 Bipolar disorder, unspecified: Secondary | ICD-10-CM | POA: Diagnosis present

## 2012-08-25 MED ORDER — VITAMIN B-12 1000 MCG PO TABS
5000.0000 ug | ORAL_TABLET | Freq: Every day | ORAL | Status: DC
  Administered 2012-08-25 – 2012-08-28 (×4): 5000 ug via ORAL
  Filled 2012-08-25 (×5): qty 5

## 2012-08-25 MED ORDER — CHLORDIAZEPOXIDE HCL 25 MG PO CAPS
25.0000 mg | ORAL_CAPSULE | Freq: Four times a day (QID) | ORAL | Status: AC | PRN
  Administered 2012-08-27 – 2012-08-28 (×2): 25 mg via ORAL
  Filled 2012-08-25: qty 1

## 2012-08-25 MED ORDER — ADULT MULTIVITAMIN W/MINERALS CH
1.0000 | ORAL_TABLET | Freq: Every day | ORAL | Status: DC
  Administered 2012-08-25 – 2012-08-28 (×4): 1 via ORAL
  Filled 2012-08-25 (×5): qty 1

## 2012-08-25 MED ORDER — LISDEXAMFETAMINE DIMESYLATE 50 MG PO CAPS
50.0000 mg | ORAL_CAPSULE | ORAL | Status: DC
  Administered 2012-08-26 – 2012-08-28 (×4): 50 mg via ORAL
  Filled 2012-08-25 (×4): qty 1

## 2012-08-25 MED ORDER — VITAMIN B-1 100 MG PO TABS
100.0000 mg | ORAL_TABLET | Freq: Every day | ORAL | Status: DC
  Administered 2012-08-26 – 2012-08-28 (×3): 100 mg via ORAL
  Filled 2012-08-25 (×4): qty 1

## 2012-08-25 MED ORDER — CHLORDIAZEPOXIDE HCL 25 MG PO CAPS
25.0000 mg | ORAL_CAPSULE | ORAL | Status: AC
  Administered 2012-08-27 (×2): 25 mg via ORAL
  Filled 2012-08-25 (×2): qty 1

## 2012-08-25 MED ORDER — LOPERAMIDE HCL 2 MG PO CAPS: 2.0000 mg | ORAL_CAPSULE | ORAL | Status: AC | PRN

## 2012-08-25 MED ORDER — HYDROXYZINE HCL 25 MG PO TABS
25.0000 mg | ORAL_TABLET | Freq: Four times a day (QID) | ORAL | Status: AC | PRN
  Administered 2012-08-27: 25 mg via ORAL

## 2012-08-25 MED ORDER — CHLORDIAZEPOXIDE HCL 25 MG PO CAPS
25.0000 mg | ORAL_CAPSULE | Freq: Three times a day (TID) | ORAL | Status: AC
  Administered 2012-08-26 (×2): 25 mg via ORAL
  Filled 2012-08-25 (×2): qty 1

## 2012-08-25 MED ORDER — ONDANSETRON 4 MG PO TBDP
4.0000 mg | ORAL_TABLET | Freq: Four times a day (QID) | ORAL | Status: AC | PRN
  Administered 2012-08-27: 4 mg via ORAL

## 2012-08-25 MED ORDER — CHLORDIAZEPOXIDE HCL 25 MG PO CAPS
25.0000 mg | ORAL_CAPSULE | Freq: Four times a day (QID) | ORAL | Status: AC
  Administered 2012-08-25 – 2012-08-26 (×4): 25 mg via ORAL
  Filled 2012-08-25 (×4): qty 1

## 2012-08-25 MED ORDER — CARBAMAZEPINE ER 400 MG PO TB12
400.0000 mg | ORAL_TABLET | Freq: Two times a day (BID) | ORAL | Status: DC
  Administered 2012-08-25 – 2012-08-28 (×6): 400 mg via ORAL
  Filled 2012-08-25 (×8): qty 1

## 2012-08-25 MED ORDER — THIAMINE HCL 100 MG/ML IJ SOLN: 100.0000 mg | Freq: Once | INTRAMUSCULAR | Status: DC

## 2012-08-25 MED ORDER — CHLORDIAZEPOXIDE HCL 25 MG PO CAPS
25.0000 mg | ORAL_CAPSULE | Freq: Every day | ORAL | Status: DC
  Filled 2012-08-25: qty 1

## 2012-08-25 MED ORDER — CHLORDIAZEPOXIDE HCL 25 MG PO CAPS
50.0000 mg | ORAL_CAPSULE | Freq: Once | ORAL | Status: AC
  Administered 2012-08-25: 50 mg via ORAL

## 2012-08-25 MED ORDER — LAMOTRIGINE 100 MG PO TABS
200.0000 mg | ORAL_TABLET | Freq: Every morning | ORAL | Status: DC
  Administered 2012-08-26 – 2012-08-28 (×3): 200 mg via ORAL
  Filled 2012-08-25 (×5): qty 2

## 2012-08-25 NOTE — Progress Notes (Signed)
[facility] LCSW Group Therapy  08/25/2012 1:15  Type of Therapy:  Group Therapy 1:15 to 2:30  Participation Level:  Active  Participation Quality:  Appropriate, Attentive and Sharing  Affect:  Appropriate  Cognitive:  Alert and Oriented  Insight:  Developing  Engagement in Therapy:  Developing/Improving  Modes of Intervention:  Clarification, Discussion, Exploration, Orientation and Reality Testing  Summary of Progress/Problems: Focus of group processing discussion was on balance in life; the components in life which have a negative influence on balance and the components which make for a more balanced life.  Patient shared how she feels her addiction is negatively affecting most areas of her life and she has long not felt balance in her life.   Ana Rose
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
## 2012-08-25 NOTE — [facility] Group Notes (Signed)
[facility] LCSW Aftercare Discharge Planning Group Note   08/25/2012 8:45 AM  Participation Quality:  Appropriate  Mood/Affect:  Anxious and Depressed  Depression Rating:  3  Anxiety Rating:  7  Thoughts of Suicide:  No  Will you contract for safety?   NA  Current AVH:  Negative  Plan for Discharge/Comments:  Patient is currently working with therapist, CSW will obtain consent to contact, set up follow up and assess for additional needs  Transportation Means:  Significant other  Supports: Significant other   Jakiya Bookbinder, Julious Payer

## 2012-08-25 NOTE — [facility] Suicide Risk Assessment (Signed)
[facility] INPATIENT:  Family/Significant Other Suicide Prevention Education  Suicide Prevention Education:  Education Completed; Patient's significant other, Dianah Pruett at [phone] has been identified by the patient as the family member/significant other with whom the patient will be residing, and identified as the person(s) who will aid the patient in the event of a mental health crisis (suicidal ideations/suicide attempt).  With written consent from the patient, the family member/significant other has been provided the following suicide prevention education, prior to the and/or following the discharge of the patient.  The suicide prevention education provided includes the following:  Suicide risk factors  Suicide prevention and interventions  National Suicide Hotline telephone number  [facility] assessment telephone number  [facility] Emergency Assistance 911  [facility] and/or Residential Mobile Crisis Unit telephone number  Request made of family/significant other to:  Remove weapons (e.g., guns, rifles, knives), all items previously/currently identified as safety concern.    Remove drugs/medications (over-the-counter, prescriptions, illicit drugs), all items previously/currently identified as a safety concern.  The family member/significant other verbalizes understanding of the suicide prevention education information provided.  The family member/significant other agrees to remove the items of safety concern listed above.  Clide Dales 08/25/2012, 8:26 PM

## 2012-08-25 NOTE — Progress Notes (Signed)
Adult Psychoeducational Group Note  Date:  08/25/2012 Time:  10:00a Group Topic/Focus:  Early Warning Signs:   The focus of this group is to help patients identify signs or symptoms they exhibit before slipping into an unhealthy state or crisis.  Participation Level:  Active  Participation Quality:  Sharing  Affect:  Appropriate  Cognitive:  Appropriate  Insight: Good  Engagement in Group:  Engaged  Modes of Intervention:  Discussion  Additional Comments:  Although patient could not read the pamphlet patient did attend the group and listen to everyone else read. Patient gave some insight and have stated she has been clean for two days and realize she can walk on her own without any pain medicine.  Demetrica Zipp H 08/25/2012, 12:00 PM

## 2012-08-25 NOTE — Progress Notes (Signed)
D Ana Rose has been OOb UAL on the 300 hall today...tolerated fair. She is quiet. She does not talk or inteact. She attends group and demonstrates minimal engagement in her recovery. When asked what does happy feel like to her, she does not have an answer for this nurse.   A She is complaint with her medications. Her VS show no signs of acute withdrawal and she has not asked for any prns medications.   R safety is maintained and POC includes continuing to foster therapeutic relationship and help pt engage in her recovery.

## 2012-08-25 NOTE — Progress Notes (Signed)
Patient ID: Ana Rose, female   DOB: [birth year], 37 y.o.   MRN: [id number] D: Patient presented with depressed mood and flat affect.  Pt denies any s/s of withdrawals. Pt attended evening AA and interacted appropriately with peers. Pt denies SI/HI/AVH and pain.  Cooperative with my assessment. No acute distressed noted at this time.   A: Met with pt 1:1. Medications administered as prescribed. Writer encouraged pt to discuss feelings. Pt encouraged to come to staff with any question or concerns. 15 minutes checks for safety.  R: Patient remains safe. She is complaint with medications and group programming. Continue current POC.

## 2012-08-25 NOTE — Tx Team (Signed)
Interdisciplinary Treatment Plan Update (Adult)  Date: 08/25/2012  Time Reviewed: 11:09 AM   Progress in Treatment: Attending groups: Yes Participating in groups: Yes Taking medication as prescribed:  Yes Tolerating medication:  Yes Family/Significant othe contact made: Not as yet Patient understands diagnosis: Yes Discussing patient identified problems/goals with staff: Yes Medical problems stabilized or resolved:  Yes Denies suicidal/homicidal ideation: Yes Patient has not harmed self or Others: Yes  New problem(s) identified: None Identified  Discharge Plan or Barriers:  CSW is assessing for appropriate referrals. Patient reports she has a therapist, will obtain consent and arrange follow up and assess for additional needs  Additional comments: N/A  Reason for Continuation of Hospitalization: Anxiety Depression Medication stabilization   Estimated length of stay: 3  For review of initial/current patient goals, please see plan of care.  Attendees: Patient:     Family:     Physician:  Geoffery Lyons 08/25/2012 10:15 AM   Nursing:   Nestor Ramp RN 08/25/2012 10:15 AM   Clinical Social Worker Ronda Fairly 08/25/2012 10:15 AM   Other:     Other:     Other:     Other:      Scribe for Treatment Team:   Carney Bern, LCSWA  08/25/2012 10:15 AM
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
## 2012-08-26 DIAGNOSIS — F1020 Alcohol dependence, uncomplicated: Secondary | ICD-10-CM

## 2012-08-26 DIAGNOSIS — F319 Bipolar disorder, unspecified: Secondary | ICD-10-CM

## 2012-08-26 NOTE — Progress Notes (Signed)
D) Pt rates her depression at a 2 and her hopelessness at an 8. Denies SI and HI. In a 1:1 Pt verbalized sad feelings over missing her daughters ball this evening. Pt feels as though she has hurt her daughters feelings by not being at the home tonight to see her dressed up and leave for the ball.  A) Processed with Pt concerning the reason she is in the hospital to start with and that she tried to take her life. Encouraged Pt to think of what could have happened to her daughter if she had been successful in her attempt.  R) Pt. Remains sad but is hopeful that she will be able to talk with her daughter sometime tomorrow.

## 2012-08-26 NOTE — Progress Notes (Signed)
[facility] MD Progress Note  08/26/2012 3:26 PM Ana Rose  MRN:  [id number]  Subjective:  "I'm a little depressed today. I missed my daughter's game because I'm here. I have accepted the fact that I have to be here to get well. I have to make it work. My depression is at #2. I'm more anxious today than I', depressed. My anxiety is at #4".  Diagnosis:   Axis I: Alcohol dependence, Bipolar affective disorder Axis II: Deferred Axis III:  Past Medical History  Diagnosis Date  . GERD (gastroesophageal reflux disease)   . Hiatal hernia   . CHF (congestive heart failure) 2001    post partum cardiomyopathy - resolved  . PONV (postoperative nausea and vomiting)   . Cardiomyopathy     POST PARTUM  . Heart murmur     funtional  . Epileptic seizures 05/2011  . ADD (attention deficit disorder)    Axis IV: other psychosocial or environmental problems Axis V: 41-50 serious symptoms  ADL's:  Intact  Sleep: Good  Appetite:  Good  Suicidal Ideation:  Plan:  Denies Intent:  Denies Means:  Denies Homicidal Ideation:  Plan:  Denies Intent:  Denies Means:  Denies AEB (as evidenced by):  Psychiatric Specialty Exam: Review of Systems  Constitutional: Negative.   HENT: Negative.   Eyes: Negative.   Respiratory: Negative.   Cardiovascular: Negative.   Gastrointestinal: Negative.   Genitourinary: Negative.   Musculoskeletal: Negative.   Skin: Negative.   Neurological: Negative.   Endo/Heme/Allergies: Negative.   Psychiatric/Behavioral: Positive for depression and substance abuse. Negative for suicidal ideas, hallucinations and memory loss. The patient is nervous/anxious. The patient does not have insomnia.     Blood pressure 119/82, pulse 95, temperature 97.8 F (36.6 C), temperature source Oral, resp. rate 18, height 5\' 5"  (1.651 m), weight 64.864 kg (143 lb), last menstrual period 11/18/2010, SpO2 99.00%.Body mass index is 23.8 kg/(m^2).  General Appearance: Casual  Eye Contact::   Good  Speech:  Clear and Coherent  Volume:  Normal  Mood: " Depressed because I missed my daughter's game today"  Affect:  Flat and Tearful  Thought Process:  Goal Directed and Intact  Orientation:  Full (Time, Place, and Person)  Thought Content:  Rumination  Suicidal Thoughts:  No  Homicidal Thoughts:  No  Memory:  Immediate;   Good Recent;   Good Remote;   Good  Judgement:  Fair  Insight:  Fair  Psychomotor Activity:  Normal  Concentration:  Good  Recall:  Good  Akathisia:  No  Handed:  Right  AIMS (if indicated):     Assets:  Desire for Improvement  Sleep:  Number of Hours: 5.5   Current Medications: Current Facility-Administered Medications  Medication Dose Route Frequency Provider Last Rate Last Dose  . alum & mag hydroxide-simeth (MAALOX/MYLANTA) 200-200-20 MG/5ML suspension 30 mL  30 mL Oral Q4H PRN Karolee Stamps, NP      . carbamazepine (TEGRETOL XR) 12 hr tablet 400 mg  400 mg Oral BID Sanjuana Kava, NP   400 mg at 08/26/12 0911  . chlordiazePOXIDE (LIBRIUM) capsule 25 mg  25 mg Oral Q6H PRN Sanjuana Kava, NP      . chlordiazePOXIDE (LIBRIUM) capsule 25 mg  25 mg Oral TID Sanjuana Kava, NP   25 mg at 08/26/12 1147   Followed by  . [START ON 08/27/2012] chlordiazePOXIDE (LIBRIUM) capsule 25 mg  25 mg Oral BH-qamhs Sanjuana Kava, NP  Followed by  . [START ON 08/29/2012] chlordiazePOXIDE (LIBRIUM) capsule 25 mg  25 mg Oral Daily Sanjuana Kava, NP      . ferrous sulfate tablet 325 mg  325 mg Oral Q breakfast Karolee Stamps, NP   325 mg at 08/26/12 0911  . gabapentin (NEURONTIN) capsule 300 mg  300 mg Oral Daily Karolee Stamps, NP   300 mg at 08/26/12 0911  . gabapentin (NEURONTIN) capsule 600 mg  600 mg Oral QHS Karolee Stamps, NP   600 mg at 08/25/12 2227  . hydrOXYzine (ATARAX/VISTARIL) tablet 25 mg  25 mg Oral Q6H PRN Karolee Stamps, NP   25 mg at 08/25/12 0138  . hydrOXYzine (ATARAX/VISTARIL) tablet 25 mg  25 mg Oral Q6H PRN Sanjuana Kava, NP      .  lamoTRIgine (LAMICTAL) tablet 200 mg  200 mg Oral q morning - 10a Sanjuana Kava, NP   200 mg at 08/26/12 0911  . lisdexamfetamine (VYVANSE) capsule 50 mg  50 mg Oral BH-q7a Rachael Fee, MD   50 mg at 08/26/12 0911  . loperamide (IMODIUM) capsule 2-4 mg  2-4 mg Oral PRN Sanjuana Kava, NP      . magnesium hydroxide (MILK OF MAGNESIA) suspension 30 mL  30 mL Oral Daily PRN Karolee Stamps, NP      . multivitamin with minerals tablet 1 tablet  1 tablet Oral Daily Sanjuana Kava, NP   1 tablet at 08/26/12 0914  . ondansetron (ZOFRAN) tablet 4 mg  4 mg Oral Q8H PRN Karolee Stamps, NP      . ondansetron (ZOFRAN-ODT) disintegrating tablet 4 mg  4 mg Oral Q6H PRN Sanjuana Kava, NP      . pantoprazole (PROTONIX) EC tablet 40 mg  40 mg Oral QHS Karolee Stamps, NP   40 mg at 08/25/12 2227  . thiamine (B-1) injection 100 mg  100 mg Intramuscular Once Sanjuana Kava, NP      . thiamine (VITAMIN B-1) tablet 100 mg  100 mg Oral Daily Sanjuana Kava, NP   100 mg at 08/26/12 0911  . traZODone (DESYREL) tablet 50 mg  50 mg Oral QHS PRN,MR X 1 Karolee Stamps, NP   50 mg at 08/26/12 0005  . vitamin B-12 (CYANOCOBALAMIN) tablet 5,000 mcg  5,000 mcg Oral Daily Sanjuana Kava, NP   5,000 mcg at 08/26/12 9562    Lab Results: No results found for this or any previous visit (from the past 48 hour(s)).  Physical Findings: AIMS: Facial and Oral Movements Muscles of Facial Expression: None, normal Lips and Perioral Area: None, normal Jaw: None, normal Tongue: None, normal,Extremity Movements Upper (arms, wrists, hands, fingers): None, normal Lower (legs, knees, ankles, toes): None, normal, Trunk Movements Neck, shoulders, hips: None, normal, Overall Severity Severity of abnormal movements (highest score from questions above): None, normal Incapacitation due to abnormal movements: None, normal Patient's awareness of abnormal movements (rate only patient's report): No Awareness, Dental Status Current problems with  teeth and/or dentures?: No Does patient usually wear dentures?: No  CIWA:  CIWA-Ar Total: 0 COWS:     Treatment Plan Summary: Daily contact with patient to assess and evaluate symptoms and progress in treatment Medication management  Plan: Supportive approach/coping skills/relapse prevention. Encouraged out of room, participation in group sessions and application of coping skills when distressed. Will continue to monitor response to/adverse effects of medications in use to assure effectiveness. Continue to monitor  mood, behavior and interaction with staff and other patients. Continue current plan of care.  Medical Decision Making Problem Points:  Established problem, stable/improving (1), Review of last therapy session (1) and Review of psycho-social stressors (1) Data Points:  Review of medication regiment & side effects (2) Review of new medications or change in dosage (2)  I certify that inpatient services furnished can reasonably be expected to improve the patient's condition.   Armandina Stammer I 08/26/2012, 3:26 PM

## 2012-08-26 NOTE — [facility] Group Notes (Signed)
[facility] LCSW Group Therapy  08/26/2012 10am  Type of Therapy:  Group Therapy  Participation Level:  Active  Participation Quality:  Appropriate, Attentive, Monopolizing, Redirectable, Sharing and Supportive  Affect:  Blunted  Cognitive:  Appropriate and Oriented  Insight:  Developing/Improving  Engagement in Therapy:  Engaged  Modes of Intervention:  Exploration  Summary of Progress/Problems:  The main focus of today's process group was for the patient to identify ways in which they have in the past sabotaged their own recovery. Motivational Interviewing was utilized to ask the group members what they get out of their substance use, and how their behavior interferes with who they want to be.  The Stages of Change were explained, and members rated their motivation to change on a scale of 0 (no desire) to 10 (completely committed).  The patient expressed that she used to be addicted to food, had gastric bypass surgery, and then became addicted to substances.  She knows that she is not the mother she wants to be, was really shocked recently when her partner was reporting to EMT workers that they could not wake her (patient) up, and daughter responded, "Well that's normal."  Motivation to change is 65, although throughout group her focus clearly is always on others, and she feels a great need to take care of other people and ignore her own needs.  Sarina Ser 08/26/2012, 12:36 PM

## 2012-08-27 NOTE — Progress Notes (Signed)
Patient ID: Ana Rose, female   DOB: March 18, 1976, 37 y.o.   MRN: [id number] D)  Was in her room interacting with roommate, happy that they seem to be getting along and have some things in common.  Attended group, has been interacting appropriately with staff and peers, but seems somewhat guarded.   A)  Will continue to monitor for safety, continue POC R)  Safety maintained.

## 2012-08-27 NOTE — Progress Notes (Signed)
Thankfulness  Group Note  Date: 08/27/2012 Time: 0900  Group Topic/Focus:  Having Thankfulness Group :   The focus of this group is to help patients identify  Things / people and events in their lives they are thankful for..  Participation Level:  Active  Participation Quality:  Appropriate  Affect:  Appropriate  Cognitive:  Appropriate  Insight:  Engaged  Engagement in Group:  Engaged  Additional Comments:    08/27/2012,9:55 AM Rich Brave

## 2012-08-27 NOTE — Progress Notes (Signed)
Psychoeducational Group Note  Date:  08/27/2012 Time: 1015 Group Topic/Focus:  Making Healthy Choices:   The focus of this group is to help patients identify negative/unhealthy choices they were using prior to admission and identify positive/healthier coping strategies to replace them upon discharge.  Participation Level:  Active  Participation Quality:  Appropriate  Affect:  Appropriate  Cognitive:  Appropriate  Insight:  Engaged  Engagement in Group:  Engaged  Additional Comments:    Ana Rose 4:16 PM. 08/27/2012

## 2012-08-27 NOTE — Progress Notes (Addendum)
Pt states she has been sober times 145 days and just had a bad day. She stated when she woke up after drinking she had a bottle of pills in her hand. She immediatley talked to her roommate. Pt is very pleasant and cooperative. Pt is willing to talk and is attending groups. Denies feeling SI or HI and does contract for safety . Pt stated she does get nervous with people visiting her here as it is embarrassing to her that she is here. Pt remains safe and remains on q 15 minute checks. Pt wants to cherish her life more, and feels the treatment here has helped her with the heeling process. Pt feels 2/10 depressed today and a 1/10 for feeling hopeless. Pt stated her visit with mom was okay. She stated her mom is blaming herself for not realizing her daughter needed help and had problems. Pt stated," When my mom does this it just makes me feel really bad and confused inside." Instructed the pt she has permission to ask mom if these things can be discussed at a later date . Also asked pt if she might want to consider writing mom a letter. Pt appeared receptive.

## 2012-08-27 NOTE — [facility] Group Notes (Signed)
[facility] LCSW Group Therapy  08/27/2012 10:00-11:00am  Summary of Progress/Problems:  The main focus of today's process group was to   identify the patient's current support system and decide on other supports that can be put in place.  Four definitions/levels of support were discussed and an exercise was utilized to show how much stronger we become with additional supports providing accountability, improved physical and emotional health, and better problem-solving skills.  An emphasis was placed on using counselor, doctor, therapy groups, 12-step groups, and problem-specific support groups to expand supports. The patient expressed herself throughout group and was supportive of others.  She also recognized that she must take care of herself first, even before her daughter, in order to be available to her daughter or anyone else.  Type of Therapy:  Group Therapy  Participation Level:  Active  Participation Quality:  Appropriate, Attentive, Sharing and Supportive  Affect:  Blunted and Tearful  Cognitive:  Alert, Appropriate and Oriented  Insight:  Engaged  Engagement in Therapy:  Engaged  Modes of Intervention:  Education, Exploration and Support Summary of Progress/Problems:  Ana Rose 08/27/2012, 11:17 AM

## 2012-08-27 NOTE — Progress Notes (Signed)
[facility] Group Notes:  (Nursing/MHT/Case Management/Adjunct)  Date:  08/26/2012  Time:  2100 Type of Therapy:  wrap up group  Participation Level:  Active  Participation Quality:  Appropriate, Attentive, Sharing and Supportive  Affect:  Depressed  Cognitive:  Appropriate  Insight:  Appropriate  Engagement in Group:  Engaged  Modes of Intervention:  Clarification, Education and Support  Summary of Progress/Problems:Pt feels that today is the first day she has accepted she needs help and wants to work the program.  Pt states she has been crying off and on all day, because she is sad she missed her daughters [place] dance. Pt stated, " I want to repair the reasons I am here, I want to repair my mind, change my thinking." Pt encouraged not to dwell on missing her daughters dance but to take care of herself so she can enjoy many more important events in the future to come.  Shelah Lewandowsky 08/27/2012, 2:41 AM

## 2012-08-27 NOTE — Progress Notes (Signed)
Patient ID: Ana Rose, female   DOB: [birth year], 37 y.o.   MRN: [id number] [facility] MD Progress Note  08/27/2012 4:10 PM Lassie Demorest  MRN:  [id number]  Subjective:  "I'm a little anxious because my mother is coming to see me here today at lunch. I know that she loves me but I don't want her to see me like this in a place like this. I don't want her to think that I can't handle situations because I can. I was not just thinking straight when I attempted suici".de   Diagnosis:   Axis I: Alcohol dependence, Bipolar affective disorder Axis II: Deferred Axis III:  Past Medical History  Diagnosis Date  . GERD (gastroesophageal reflux disease)   . Hiatal hernia   . CHF (congestive heart failure) 2001    post partum cardiomyopathy - resolved  . PONV (postoperative nausea and vomiting)   . Cardiomyopathy     POST PARTUM  . Heart murmur     funtional  . Epileptic seizures 05/2011  . ADD (attention deficit disorder)    Axis IV: other psychosocial or environmental problems Axis V: 41-50 serious symptoms  ADL's:  Intact  Sleep: Good  Appetite:  Good  Suicidal Ideation:  Plan:  Denies Intent:  Denies Means:  Denies Homicidal Ideation:  Plan:  Denies Intent:  Denies Means:  Denies AEB (as evidenced by):  Psychiatric Specialty Exam: Review of Systems  Constitutional: Negative.   HENT: Negative.   Eyes: Negative.   Respiratory: Negative.   Cardiovascular: Negative.   Gastrointestinal: Negative.   Genitourinary: Negative.   Musculoskeletal: Negative.   Skin: Negative.   Neurological: Negative.   Endo/Heme/Allergies: Negative.   Psychiatric/Behavioral: Positive for depression and substance abuse. Negative for suicidal ideas, hallucinations and memory loss. The patient is nervous/anxious. The patient does not have insomnia.     Blood pressure 120/74, pulse 102, temperature 98.1 F (36.7 C), temperature source Oral, resp. rate 20, height 5\' 5"  (1.651 m), weight 64.864 kg (143  lb), last menstrual period 11/18/2010, SpO2 99.00%.Body mass index is 23.8 kg/(m^2).  General Appearance: Casual  Eye Contact::  Good  Speech:  Clear and Coherent  Volume:  Normal  Mood: " Depressed because I missed my daughter's game today"  Affect:  Flat and Tearful  Thought Process:  Goal Directed and Intact  Orientation:  Full (Time, Place, and Person)  Thought Content:  Rumination  Suicidal Thoughts:  No  Homicidal Thoughts:  No  Memory:  Immediate;   Good Recent;   Good Remote;   Good  Judgement:  Fair  Insight:  Fair  Psychomotor Activity:  Normal  Concentration:  Good  Recall:  Good  Akathisia:  No  Handed:  Right  AIMS (if indicated):     Assets:  Desire for Improvement  Sleep:  Number of Hours: 6   Current Medications: Current Facility-Administered Medications  Medication Dose Route Frequency Provider Last Rate Last Dose  . alum & mag hydroxide-simeth (MAALOX/MYLANTA) 200-200-20 MG/5ML suspension 30 mL  30 mL Oral Q4H PRN Karolee Stamps, NP      . carbamazepine (TEGRETOL XR) 12 hr tablet 400 mg  400 mg Oral BID Sanjuana Kava, NP   400 mg at 08/27/12 0810  . chlordiazePOXIDE (LIBRIUM) capsule 25 mg  25 mg Oral Q6H PRN Sanjuana Kava, NP   25 mg at 08/27/12 1209  . chlordiazePOXIDE (LIBRIUM) capsule 25 mg  25 mg Oral BH-qamhs Sanjuana Kava, NP   25 mg at  08/27/12 0811   Followed by  . [START ON 08/29/2012] chlordiazePOXIDE (LIBRIUM) capsule 25 mg  25 mg Oral Daily Sanjuana Kava, NP      . ferrous sulfate tablet 325 mg  325 mg Oral Q breakfast Karolee Stamps, NP   325 mg at 08/27/12 0811  . gabapentin (NEURONTIN) capsule 300 mg  300 mg Oral Daily Karolee Stamps, NP   300 mg at 08/27/12 1610  . gabapentin (NEURONTIN) capsule 600 mg  600 mg Oral QHS Karolee Stamps, NP   600 mg at 08/26/12 2208  . hydrOXYzine (ATARAX/VISTARIL) tablet 25 mg  25 mg Oral Q6H PRN Karolee Stamps, NP   25 mg at 08/27/12 1435  . hydrOXYzine (ATARAX/VISTARIL) tablet 25 mg  25 mg Oral Q6H PRN  Sanjuana Kava, NP   25 mg at 08/27/12 0813  . lamoTRIgine (LAMICTAL) tablet 200 mg  200 mg Oral q morning - 10a Sanjuana Kava, NP   200 mg at 08/27/12 9604  . lisdexamfetamine (VYVANSE) capsule 50 mg  50 mg Oral BH-q7a Rachael Fee, MD   50 mg at 08/27/12 1257  . loperamide (IMODIUM) capsule 2-4 mg  2-4 mg Oral PRN Sanjuana Kava, NP      . magnesium hydroxide (MILK OF MAGNESIA) suspension 30 mL  30 mL Oral Daily PRN Karolee Stamps, NP      . multivitamin with minerals tablet 1 tablet  1 tablet Oral Daily Sanjuana Kava, NP   1 tablet at 08/27/12 1206  . ondansetron (ZOFRAN) tablet 4 mg  4 mg Oral Q8H PRN Karolee Stamps, NP      . ondansetron (ZOFRAN-ODT) disintegrating tablet 4 mg  4 mg Oral Q6H PRN Sanjuana Kava, NP   4 mg at 08/27/12 1207  . pantoprazole (PROTONIX) EC tablet 40 mg  40 mg Oral QHS Karolee Stamps, NP   40 mg at 08/26/12 2208  . thiamine (B-1) injection 100 mg  100 mg Intramuscular Once Sanjuana Kava, NP      . thiamine (VITAMIN B-1) tablet 100 mg  100 mg Oral Daily Sanjuana Kava, NP   100 mg at 08/27/12 5409  . traZODone (DESYREL) tablet 50 mg  50 mg Oral QHS PRN,MR X 1 Karolee Stamps, NP   50 mg at 08/26/12 2307  . vitamin B-12 (CYANOCOBALAMIN) tablet 5,000 mcg  5,000 mcg Oral Daily Sanjuana Kava, NP   5,000 mcg at 08/27/12 8119    Lab Results: No results found for this or any previous visit (from the past 48 hour(s)).  Physical Findings: AIMS: Facial and Oral Movements Muscles of Facial Expression: None, normal Lips and Perioral Area: None, normal Jaw: None, normal Tongue: None, normal,Extremity Movements Upper (arms, wrists, hands, fingers): None, normal Lower (legs, knees, ankles, toes): None, normal, Trunk Movements Neck, shoulders, hips: None, normal, Overall Severity Severity of abnormal movements (highest score from questions above): None, normal Incapacitation due to abnormal movements: None, normal Patient's awareness of abnormal movements (rate only  patient's report): No Awareness, Dental Status Current problems with teeth and/or dentures?: No Does patient usually wear dentures?: No  CIWA:  CIWA-Ar Total: 0 COWS:     Treatment Plan Summary: Daily contact with patient to assess and evaluate symptoms and progress in treatment Medication management  Plan: Supportive approach/coping skills/relapse prevention. Encouraged out of room, participation in group sessions and application of coping skills when distressed. Will continue to monitor response to/adverse effects  of medications in use to assure effectiveness. Continue to monitor mood, behavior and interaction with staff and other patients. Continue current plan of care.  Medical Decision Making Problem Points:  Established problem, stable/improving (1), Review of last therapy session (1) and Review of psycho-social stressors (1) Data Points:  Review of medication regiment & side effects (2) Review of new medications or change in dosage (2)  I certify that inpatient services furnished can reasonably be expected to improve the patient's condition.   Armandina Stammer I 08/27/2012, 4:10 PM

## 2012-08-28 MED ORDER — LISDEXAMFETAMINE DIMESYLATE 50 MG PO CAPS: 50.0000 mg | ORAL_CAPSULE | ORAL | Status: DC

## 2012-08-28 MED ORDER — TRAZODONE HCL 50 MG PO TABS: 50.0000 mg | ORAL_TABLET | Freq: Every evening | ORAL | Status: DC | PRN

## 2012-08-28 MED ORDER — ONE-DAILY MULTI VITAMINS PO TABS: 1.0000 | ORAL_TABLET | Freq: Every day | ORAL | Status: DC

## 2012-08-28 MED ORDER — HYDROXYZINE HCL 25 MG PO TABS: 25.0000 mg | ORAL_TABLET | Freq: Four times a day (QID) | ORAL | Status: DC | PRN

## 2012-08-28 MED ORDER — CARBAMAZEPINE ER 200 MG PO TB12: 400.0000 mg | ORAL_TABLET | Freq: Two times a day (BID) | ORAL | Status: DC

## 2012-08-28 MED ORDER — FERROUS SULFATE 325 (65 FE) MG PO TABS: 325.0000 mg | ORAL_TABLET | Freq: Every day | ORAL | Status: DC

## 2012-08-28 MED ORDER — GABAPENTIN 300 MG PO CAPS: 600.0000 mg | ORAL_CAPSULE | Freq: Every day | ORAL | Status: DC

## 2012-08-28 MED ORDER — GABAPENTIN 300 MG PO CAPS: 300.0000 mg | ORAL_CAPSULE | Freq: Every day | ORAL | Status: DC

## 2012-08-28 MED ORDER — LAMOTRIGINE 200 MG PO TABS: 200.0000 mg | ORAL_TABLET | Freq: Every morning | ORAL | Status: DC

## 2012-08-28 MED ORDER — VITAMIN B-12 1000 MCG PO TABS: 5000.0000 ug | ORAL_TABLET | Freq: Every day | ORAL | Status: DC

## 2012-08-28 MED ORDER — PANTOPRAZOLE SODIUM 40 MG PO TBEC: 40.0000 mg | DELAYED_RELEASE_TABLET | Freq: Every day | ORAL | Status: DC

## 2012-08-28 NOTE — Progress Notes (Signed)
Patient did attend the evening speaker AA meeting.  

## 2012-08-28 NOTE — Progress Notes (Signed)
Adult Psychoeducational Group Note  Date:  08/28/2012 Time:  11:00 AM  Group Topic/Focus:  Coping With Mental Health Crisis:   The purpose of this group is to help patients identify strategies for coping with mental health crisis.  Group discusses possible causes of crisis and ways to manage them effectively.  Participation Level:  Active  Participation Quality:  Attentive  Affect:  Appropriate  Cognitive:  Oriented  Insight: Good  Engagement in Group:  Engaged  Modes of Intervention:  Activity  Additional Comments:  Pt wants to make sure not to isolate and use her coping skills for depression.  Matej Sappenfield T 08/28/2012, 11:00 AM

## 2012-08-28 NOTE — Tx Team (Signed)
Interdisciplinary Treatment Plan Update (Adult)  Date: 08/28/2012  Time Reviewed: 10:18 AM   Progress in Treatment: Attending groups: Yes Participating in groups: Yes Taking medication as prescribed:  Yes Tolerating medication:  Yes Family/Significant othe contact made: Yes Patient understands diagnosis: Yes Discussing patient identified problems/goals with staff: Yes Medical problems stabilized or resolved:  Yes Denies suicidal/homicidal ideation: Yes Patient has not harmed self or Others: Yes  New problem(s) identified: None Identified  Discharge Plan or Barriers:  CSW is referring patient back to her psychiatrist and [facility] IOP   Additional comments: N/A  Reason for Continuation of Hospitalization: NA   Estimated length of stay: Discharge today  For review of initial/current patient goals, please see plan of care.  Attendees: Patient:     Family:     Physician:  Geoffery Lyons 08/28/2012 10:18 AM   Nursing:   Roswell Miners, RN 08/28/2012 10:18 AM   Clinical Social Worker Ronda Fairly 08/28/2012 10:18 AM   Other:  Berneice Heinrich, RN 08/28/2012 10:18 AM   Other:  Georgina Quint, [place] PA Student 08/28/2012 10:18 AM   Other:     Other:      Scribe for Treatment Team:   Carney Bern, LCSWA  08/28/2012 10:18 AM

## 2012-08-28 NOTE — BHH Group Notes (Signed)
[facility] LCSW Aftercare Discharge Planning Group Note   08/28/2012  8:45  Participation Quality:  Appropriate  Mood/Affect: Appropriate  Depression Rating:  3  Anxiety Rating:  4  Thoughts of Suicide:  No  Will you contract for safety?  NA  Current AVH: Negative  Plan for Discharge/Comments:  Patient will return home, follow up with her psychiatrist and get involved in [facility] IOP program in [place] or [employer]:  Significant Other  Supports: Family, Significant Other, daughter and friends  Clide Dales
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
## 2012-08-28 NOTE — Progress Notes (Signed)
Patient denies SI/HI, denies A/V hallucinations. Patient verbalizes understanding of discharge instructions, follow up care and prescriptions. Patient given all belongings from [facility] locker. Patient escorted out by staff, transported by family. 

## 2012-08-28 NOTE — Progress Notes (Signed)
Patient ID: Ana Rose, female   DOB: [birth year], 37 y.o.   MRN: [id number] D)  In better spirits this evening, pleasant, interacting well with staff and peers.  Attended group, went to room off med window to play cards , got hs meds.  States is feeling better, denied w/d sx.  Came back for something for insomnia. Denied thoughts of self harm. A)  Will continue to monitor for safety, POC R)  Safety maintained.

## 2012-08-28 NOTE — Discharge Summary (Signed)
Physician Discharge Summary Note  Patient:  Ana Rose is an 37 y.o., female MRN:  [id number] DOB:  Jun 27, 1975 Patient phone:  [phone] (home)  Patient address:   [address] [place] [address],   Date of Admission:  08/24/2012 Date of Discharge: 08/28/2012  Reason for Admission:  Alcohol detox, depression with suicide attempt by overdose  Discharge Diagnoses: Active Problems:   Bipolar disorder, unspecified   Alcohol dependence  Review of Systems  Constitutional: Negative.   HENT: Negative.   Eyes: Negative.   Respiratory: Negative.   Cardiovascular: Negative.   Gastrointestinal: Negative.   Genitourinary: Negative.   Musculoskeletal: Negative.   Skin: Positive for itching.  Neurological: Negative.   Endo/Heme/Allergies: Negative.   Psychiatric/Behavioral: Positive for substance abuse. The patient is nervous/anxious.    Axis Diagnosis:   AXIS I:  Alcohol Abuse, Anxiety Disorder NOS and Bipolar, Depressed, ADHD AXIS II:  Deferred AXIS III:   Past Medical History  Diagnosis Date  . GERD (gastroesophageal reflux disease)   . Hiatal hernia   . CHF (congestive heart failure) 2001    post partum cardiomyopathy - resolved  . PONV (postoperative nausea and vomiting)   . Cardiomyopathy     POST PARTUM  . Heart murmur     funtional  . Epileptic seizures 05/2011  . ADD (attention deficit disorder)    AXIS IV:  other psychosocial or environmental problems, problems related to social environment and problems with primary support group AXIS V:  61-70 mild symptoms  Level of Care:  OP  Hospital Course:  On admission:  Admitted to [facility] from the [facility] with complaints of suicide attempt by overdose. Patient reports, "I was taken to the [facility] ED Wednesday night by the ambulance. The reason was because I was drinking too much alcohol and made some bad decisions. I drank 2 whole bottles of wine in 1 day, passed out, then made my  girl-friend call 911. Besides the wine, I also took bunch of pills meant for my dogs. I don't know what the medicines are called. I attempted to kill myself right then. I was thinking that my family will be better off without me. I'm not depressed, now or at the time of this incident. But I was manic. I was diagnosed with Bipolar disorder two years ago. At the time, I was put on Trileptal. However, it got to a point about a month ago when I started to show some manic signs/symptoms. I saw my doctor, and my medicines were changed. I was started on Tegretol. I started the Tegretol, and I was also drinking alcohol while on it. I am an alcoholic. 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 I was once addicted to food. After my surgery, I developed cross-addiction, only this time it is alcohol. I can be impulsive and unreasonable when manic. I am not depressed, rather anxious right now. My anxiety is at #5. I regreted having to attempt to take my own life. I am no longer suicidal".     During hospitalization:  Roshell attended therapy groups and AA.  She stated in group that her life was not in balance because of her alcohol use.  Talisha reports food addiction in the past with a gastric by-pass surgery.  A big stressor is her family's non-acceptance of her homosexuality.  Her mother did visit her on the unit and stated she blames herself for her daughter's issues.  The  counselor encouraged her to write her a letter to express how she feels.  Dinesha does have a supportive partner and daughter, she stated she likes to take care of other people and realizes she needs to take care of herself first.  Medication managed:  Her gabapentin 300 mg in the morning was continued and her night time dose of 300 mg was increased to 600 mg. Vistaril 25 mg for anxiety every six hours was started.  Librium protocol for alcohol detox stated and completed.  Her tegretol 400 mg BID for her seizures was  continued and her Protonix 40 mg at bedtime for GERD.  Trazodone 50 mg for sleep issues was started and effective.  Caelin's lamictal 200 mg every morning for seizures and mood stability continued.  Vyvance 50 mg started for her ADHD diagnosis.  Patient denied suicidal/homicidal ideations and auditory/visual hallucinations, follow-up appointments encouraged to attend.  Kaidynce will continue her care at [facility] IOP for her alcohol dependency and will continue to see her therapist.  She is mentally and physically stable for discharge.  Consults:  None  Significant Diagnostic Studies:  labs: Completed and reviewed, stable  Discharge Vitals:   Blood pressure 114/80, pulse 109, temperature 97.7 F (36.5 C), temperature source Oral, resp. rate 20, height 5\' 5"  (1.651 m), weight 64.864 kg (143 lb), last menstrual period 11/18/2010, SpO2 99.00%. Body mass index is 23.8 kg/(m^2). Lab Results:   No results found for this or any previous visit (from the past 72 hour(s)).  Physical Findings: AIMS: Facial and Oral Movements Muscles of Facial Expression: None, normal Lips and Perioral Area: None, normal Jaw: None, normal Tongue: None, normal,Extremity Movements Upper (arms, wrists, hands, fingers): None, normal Lower (legs, knees, ankles, toes): None, normal, Trunk Movements Neck, shoulders, hips: None, normal, Overall Severity Severity of abnormal movements (highest score from questions above): None, normal Incapacitation due to abnormal movements: None, normal Patient's awareness of abnormal movements (rate only patient's report): No Awareness, Dental Status Current problems with teeth and/or dentures?: No Does patient usually wear dentures?: No  CIWA:  CIWA-Ar Total: 0 COWS:     Psychiatric Specialty Exam: See Psychiatric Specialty Exam and Suicide Risk Assessment completed by Attending Physician prior to discharge.  Discharge destination:  Home  Is patient on multiple antipsychotic therapies  at discharge:  No   Has Patient had three or more failed trials of antipsychotic monotherapy by history:  No Recommended Plan for Multiple Antipsychotic Therapies:  N/A     Medication List    ASK your doctor about these medications     Indication   carbamazepine 200 MG 12 hr tablet  Commonly known as:  TEGRETOL XR  Take 400 mg by mouth 2 (two) times daily.      ferrous sulfate 325 (65 FE) MG tablet  Take 325 mg by mouth daily with breakfast.      gabapentin 300 MG capsule  Commonly known as:  NEURONTIN  Take 300-600 mg by mouth 2 (two) times daily. 300 mg in the am and 600mg  in the pm      lamotrigine 25 MG disintegrating tablet  Commonly known as:  LAMICTAL  Take 200 mg by mouth every morning.      multivitamin tablet  Take 1 tablet by mouth daily.      pantoprazole 40 MG tablet  Commonly known as:  PROTONIX  Take 40 mg by mouth at bedtime.      vitamin B-12 1000 MCG tablet  Commonly known as:  CYANOCOBALAMIN  Take 5,000 mcg by mouth daily.            Follow-up Information   Follow up with [facility] On 09/07/2012. (Appointment with Dr Quintella Reichert on 4/17 at 9:00 AM)    Contact information:   [address] [place], [place] [address] Phone:  [phone] Fax:  [phone]        Follow-up recommendations:  Activity:  As tolerated Diet:  Low-sodium heart healthy diet  Comments:  Patient will attend IOP at [facility] after discharge and continue with her primary counselor. Continue to work a relapse prevention plan Total Discharge Time:  Greater than 30 minutes.  SignedNanine Means, PMH-NP 08/28/2012, 9:11 AM

## 2012-08-30 ENCOUNTER — Other Ambulatory Visit (HOSPITAL_COMMUNITY): Payer: BC Managed Care – PPO | Attending: Psychiatry | Admitting: Psychology

## 2012-08-30 DIAGNOSIS — F3130 Bipolar disorder, current episode depressed, mild or moderate severity, unspecified: Secondary | ICD-10-CM | POA: Insufficient documentation

## 2012-08-30 DIAGNOSIS — F4310 Post-traumatic stress disorder, unspecified: Secondary | ICD-10-CM | POA: Insufficient documentation

## 2012-08-30 DIAGNOSIS — F1020 Alcohol dependence, uncomplicated: Secondary | ICD-10-CM

## 2012-08-31 ENCOUNTER — Encounter (HOSPITAL_COMMUNITY): Payer: Self-pay | Admitting: Psychology

## 2012-08-31 NOTE — Progress Notes (Signed)
Patient Discharge Instructions:  After Visit Summary (AVS):   Faxed to:  08/31/12 Discharge Summary Note:   Faxed to:  08/31/12 Psychiatric Admission Assessment Note:   Faxed to:  08/31/12 Suicide Risk Assessment - Discharge Assessment:   Faxed to:  08/31/12 Faxed/Sent to the Next Level Care provider:  08/31/12 Faxed to [facility] @ [phone]  Jerelene Redden, 08/31/2012, 3:17 PM

## 2012-08-31 NOTE — Progress Notes (Signed)
    Daily Group Progress Note  Program: CD-IOP   Group Time: 1-2:30 pm  Participation Level: Active  Behavioral Response: Appropriate and Sharing  Type of Therapy: Process Group  Topic: Group Process: first part of group was spent in process. Members shared about the steps they are taking in their recovery. There was talk about cravings and different ways to deal with them. One of the newest group members described her first AA meeting and she agreed that it had been very beneficial and empowering. A new member was present and she shared about her struggles. There was good discussion and feedback among the group.  Group Time: 2:45- 4 pm  Participation Level: Active  Behavioral Response: Sharing  Type of Therapy: Psycho-education Group  Topic: Values: a psycho-ed piece on Values was presented in the second half of group. Members were provided handouts that included a long list of values. Group members were given time to identify some of their values and then describe 3 ways they will demonstrate that value in their daily lives. The group came to recognize that these, supposed, values had not been demonstrated or acknowledged while they were in their active addiction. Part of sobriety is identifying and living out one's values in their everyday life. Members shared the values they had identified and what they intend to do in terms of behaviors and actions to demonstrate they are living out those values.   Summary: The patient was new to the group and introduced herself. She was quiet, but attentive and made some good comments about her drinking. Near the end of the first half of group, the patient was observed nodding off. When I mentioned this at break, she apologized and explained she had taken a Vistaril about 2 hours ago because she was nervous. It had made her sleepy, though, and she assured me she wasn't like that. In the second half of group, the patient reported that when she was  drinking she did not live out her values. She identified one value she wanted to work on which was "Commitment". She explained that identifying core values, becoming more trustworthy, and staying sober. The patient reported she would attend the 10 am Women's AA meeting tomorrow and another member stated she would be there and would look for her. This patient did well in her first session. Her sobriety date is 4/2.   Family Program: Family present? No   Name of family member(s):   UDS collected: No Results:  AA/NA attended?: No, patient is new to the program, but is agreeable to attending meetings  Sponsor?: No   Marcelline Temkin, LCAS

## 2012-09-01 ENCOUNTER — Other Ambulatory Visit (HOSPITAL_COMMUNITY): Payer: BC Managed Care – PPO | Admitting: Psychology

## 2012-09-01 ENCOUNTER — Encounter (HOSPITAL_COMMUNITY): Payer: Self-pay

## 2012-09-01 DIAGNOSIS — F988 Other specified behavioral and emotional disorders with onset usually occurring in childhood and adolescence: Secondary | ICD-10-CM

## 2012-09-01 DIAGNOSIS — F319 Bipolar disorder, unspecified: Secondary | ICD-10-CM

## 2012-09-01 DIAGNOSIS — F1020 Alcohol dependence, uncomplicated: Secondary | ICD-10-CM

## 2012-09-01 DIAGNOSIS — F1920 Other psychoactive substance dependence, uncomplicated: Secondary | ICD-10-CM

## 2012-09-01 MED ORDER — ATOMOXETINE HCL 25 MG PO CAPS: ORAL_CAPSULE | ORAL | Status: DC

## 2012-09-01 NOTE — Progress Notes (Signed)
Psychiatric Assessment Adult  Patient Identification:  Ana Rose Date of Evaluation:  09/01/2012 Chief Complaint: Establish outpatient care for intensive treatment of substance abuse after completing safe medical detox on inpatient unit. History of Chief Complaint:   Chief Complaint  Patient presents with  . Drug / Alcohol Assessment  . Addiction Problem  . Alcohol Problem  . Depression  . Manic Behavior  . Anxiety  . Establish Care    HPI Ana Rose is a 37 year old white female who was admitted to come behavioral health adult inpatient unit do to a suicide attempt and for medical detox from alcohol and benzodiazepines. She endorses a history of drinking 2 bottles of wine daily, and in November was drunk at work. She went to attend an intensive outpatient program at [facility], but admits that she was not committed to sobriety at that time. She now recognizes the need for abstinence from substances of abuse, and is requesting help with her addiction problem. Review of Systems  Constitutional: Negative.   HENT: Negative.   Eyes: Negative.   Respiratory: Negative.   Cardiovascular: Negative.   Endocrine: Negative.   Genitourinary: Negative.   Musculoskeletal: Negative.   Skin: Negative.   Allergic/Immunologic: Negative.   Neurological: Negative.   Hematological: Negative.    Physical Exam  Constitutional: She is oriented to person, place, and time. She appears well-developed and well-nourished.  HENT:  Head: Normocephalic and atraumatic.  Eyes: Conjunctivae are normal. Pupils are equal, round, and reactive to light.  Neck: Normal range of motion.  Pulmonary/Chest: Effort normal.  Musculoskeletal: Normal range of motion.  Neurological: She is alert and oriented to person, place, and time.  Psychiatric: She has a normal mood and affect. Her behavior is normal. Thought content normal.    Depressive Symptoms: depressed mood, feelings of  worthlessness/guilt, difficulty concentrating, hopelessness, suicidal attempt, anxiety, panic attacks, loss of energy/fatigue, decreased labido,  (Hypo) Manic Symptoms:   Elevated Mood:  Yes Irritable Mood:  Yes Grandiosity:  No Distractibility:  Yes Labiality of Mood:  Yes Delusions:  No Hallucinations:  No Impulsivity:  Yes Sexually Inappropriate Behavior:  No Financial Extravagance:  No Flight of Ideas:  No  Anxiety Symptoms: Excessive Worry:  Yes Panic Symptoms:  Yes Agoraphobia:  No Obsessive Compulsive: No  Symptoms: None, Specific Phobias:  No Social Anxiety:  Yes  Psychotic Symptoms:  Hallucinations: No None Delusions:  No Paranoia:  No   Ideas of Reference:  No  PTSD Symptoms: Ever had a traumatic exposure:  Yes Had a traumatic exposure in the last month:  No Re-experiencing: Yes Intrusive Thoughts Hypervigilance:  Yes Hyperarousal: Yes Difficulty Concentrating Irritability/Anger Avoidance: Yes Decreased Interest/Participation  Traumatic Brain Injury: No   Past Psychiatric History: Diagnosis: bipolar disorder, ADHD, PTSD  Hospitalizations: [facility] April 2014  Outpatient Care: Dr. Hulen Skains, psychiatrist and Dr. Sheral Flow, therapist  Substance Abuse Care:  [facility]   Self-Mutilation: none  Suicidal Attempts: overdose April 2014  Violent Behaviors: denies   Past Medical History:   Past Medical History  Diagnosis Date  . GERD (gastroesophageal reflux disease)   . Hiatal hernia   . CHF (congestive heart failure) 2001    post partum cardiomyopathy - resolved  . PONV (postoperative nausea and vomiting)   . Cardiomyopathy     POST PARTUM  . Heart murmur     funtional  . Epileptic seizures 05/2011  . ADD (attention deficit disorder)    History of Loss of Consciousness:  No Seizure History:  No Cardiac History:  No Allergies:   Allergies  Allergen Reactions  . Hydrocodone Nausea And Vomiting  . Nsaids Other (See  Comments)    Oral NSAIDS contraindicated due to Gastric Bypass  . Vicodin (Hydrocodone-Acetaminophen) Nausea And Vomiting  . Xanax (Alprazolam)    Current Medications:  Current Outpatient Prescriptions  Medication Sig Dispense Refill  . atomoxetine (STRATTERA) 25 MG capsule Take one cap daily for 5 days, then take two caps daily for 5 days, then take three caps daily  90 capsule  0  . carbamazepine (TEGRETOL XR) 200 MG 12 hr tablet Take 2 tablets (400 mg total) by mouth 2 (two) times daily.  120 tablet  0  . ferrous sulfate 325 (65 FE) MG tablet Take 1 tablet (325 mg total) by mouth daily with breakfast.  30 tablet  0  . gabapentin (NEURONTIN) 300 MG capsule Take 1 capsule (300 mg total) by mouth daily.  30 capsule  0  . gabapentin (NEURONTIN) 300 MG capsule Take 2 capsules (600 mg total) by mouth at bedtime.  60 capsule  0  . hydrOXYzine (ATARAX/VISTARIL) 25 MG tablet Take 1 tablet (25 mg total) by mouth every 6 (six) hours as needed for anxiety.  30 tablet  0  . lamoTRIgine (LAMICTAL) 200 MG tablet Take 1 tablet (200 mg total) by mouth every morning.  30 tablet  0  . lisdexamfetamine (VYVANSE) 50 MG capsule Take 1 capsule (50 mg total) by mouth every morning.  1 capsule  0  . Multiple Vitamin (MULTIVITAMIN) tablet Take 1 tablet by mouth daily.  30 tablet  0  . pantoprazole (PROTONIX) 40 MG tablet Take 1 tablet (40 mg total) by mouth at bedtime.  30 tablet  0  . traZODone (DESYREL) 50 MG tablet Take 1 tablet (50 mg total) by mouth at bedtime as needed and may repeat dose one time if needed for sleep.  30 tablet  0  . vitamin B-12 (CYANOCOBALAMIN) 1000 MCG tablet Take 5 tablets (5,000 mcg total) by mouth daily.  150 tablet  0   No current facility-administered medications for this visit.    Previous Psychotropic Medications:  Medication Dose   Lamictal   Tegretol   Neurontin   Vistaril   trazodone          Substance Abuse History in the last 12 months: Patient endorses drinking 2  bottles of wine daily, and abusing her Ambien as well as Xanax.  Blackouts:  Yes DT's:  No Withdrawal Symptoms:  Yes Diarrhea Headaches Nausea Tremors Vomiting  Social History: Ana Rose was born and lived in [place] until age 53, at which time she moved to [place], [place]. She is the oldest of 3 children, and has one brother and one sister. Her parents are still alive and together. 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 She has been married and divorced 3 times, and has a currently 31 year old daughter. She is currently employed at [employer] for 3 years and is an Scientist, water quality for [employer]. She currently lives with her daughter and same sex partner of one year. She denies any legal difficulties. 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 She reports that her partner, her mother, and her sister are her social support network.  She enjoys reading.  Family History:   Family History  Problem Relation Age of Onset  . Hypertension Mother   . Hypertension Father   . Diabetes Paternal Grandmother   . Cancer Paternal Grandmother  OV/UT  . Breast cancer Cousin 34    MATERNAL  . Breast cancer Maternal Grandmother 72  . Alcoholism Paternal Grandfather   .     Marland Kitchen       Mental Status Examination/Evaluation: Objective:  Appearance: Casual and Well Groomed  Eye Contact::  Good  Speech:  Clear and Coherent  Volume:  Normal  Mood:  euthymic  Affect:  Appropriate  Thought Process:  Linear  Orientation:  Full (Time, Place, and Person)  Thought Content:  WDL  Suicidal Thoughts:  No  Homicidal Thoughts:  No  Judgement:  Fair  Insight:  Fair  Psychomotor Activity:  Normal  Akathisia:  No  Handed:  Right  AIMS (if indicated):    Assets:  Communication Skills Desire for Improvement Housing Social Support Vocational/Educational    Laboratory/X-Ray Psychological Evaluation(s)        Assessment:    AXIS I Bipolar, Depressed, Post Traumatic Stress  Disorder, Substance Abuse, Substance Induced Mood Disorder and alcohol dependence  AXIS II Deferred  AXIS III Past Medical History  Diagnosis Date  . GERD (gastroesophageal reflux disease)   . Hiatal hernia   . CHF (congestive heart failure) 2001    post partum cardiomyopathy - resolved  . PONV (postoperative nausea and vomiting)   . Cardiomyopathy     POST PARTUM  . Heart murmur     funtional  . Epileptic seizures 05/2011  . ADD (attention deficit disorder)      AXIS IV occupational problems and problems related to social environment  AXIS V 51-60 moderate symptoms   Treatment Plan/Recommendations:  Plan of Care: admit to IOP  Laboratory:  UDS  Psychotherapy: attend groups  Medications: continue meds per discharge from inpatient  Routine PRN Medications:  No  Consultations: none  Safety Concerns:  Risk for relapse, history of suicidal gestures  Other:      [facility] 4/11/20144:41 PM

## 2012-09-04 ENCOUNTER — Other Ambulatory Visit (HOSPITAL_COMMUNITY): Payer: BC Managed Care – PPO | Admitting: Psychology

## 2012-09-04 ENCOUNTER — Encounter (HOSPITAL_COMMUNITY): Payer: Self-pay | Admitting: Psychology

## 2012-09-04 ENCOUNTER — Telehealth (HOSPITAL_COMMUNITY): Payer: Self-pay | Admitting: *Deleted

## 2012-09-04 DIAGNOSIS — F1020 Alcohol dependence, uncomplicated: Secondary | ICD-10-CM

## 2012-09-04 DIAGNOSIS — F1920 Other psychoactive substance dependence, uncomplicated: Secondary | ICD-10-CM

## 2012-09-04 NOTE — Progress Notes (Signed)
    Daily Group Progress Note  Program: CD-IOP   Group Time: 1-2:30 pm  Participation Level: Active  Behavioral Response: Appropriate and Sharing  Type of Therapy: Process Group  Topic: Group Process: first part of group was spent in process. Members discussed the current issues and concerns they are facing. They shared plans for the upcoming weekend and, specifically, any things that would support their sobriety. One member admitted he had lied to the group and had used twice since he had overdosed in February. Another member returned after a trip out of state to visit a close relative who is very ill. There was good discussion and feedback.   Group Time: 2:45- 4pm  Participation Level: Active  Behavioral Response: Sharing  Type of Therapy: Psycho-education Group  Topic: Recovery: the difference between treatment and recovery. Second part of group was spent in a psycho-ed session on the differences between treatment and recovery. The aspects of recovery that differ from treatment were emphasized. Those include: a way of life, practicing honesty, and the life-long nature of the disease. The four essential elements of recovery include: acceptance, honesty, open-mindedness, and willingness. A handout was included and members shared feedback and their experiences in early recovery. During this session, the medical director met with 2 new members along with 2 other members who had questions about their medications.    Summary: The patient reported she had done well yesterday, but today she went to get something out of the refrigerator, and found a [employer] beer can looking at her. She admitted she had stood back from the door and stared at the beer for a minute or two. She went to the bedroom, laid on the bed and tried to think about something else. She finally went to another room and looked at some papers. Finally, her S/O came home for lunch and she told her how she had agonized over  this single beer. The S/O, admittedly did not understand it, but went ahead and got rid of the alcohol. The patient expressed a lot of angst over what she has probably caused her 38 yo daughter. She laughed and provided good feedback to another member. This is only her second session, but the patient appears very comfortable and seems to enjoy sharing or venting her stuff, as well as listening to others share about their struggles. The patient reported her birthday was tomorrow and she planned on spending most of the day with the daughter and S/O. she was encouraged to attend some of the women's meetings this weekend and assured the group she would be very careful and phone some of the numbers she has gotten at the meetings. The patient met with the medical director this afternoon during the session. Her sobriety date remains 4/2.   Family Program: Family present? No   Name of family member(s):   UDS collected: Yes Results: test was cancelled - this is new to me, I will order another UA today.   AA/NA attended?: YesThursday and Friday  Sponsor?: No, patient is new to the program, but has already attended [employer] She has been provided information about acquiring a sponsor   Osiris Odriscoll, LCAS

## 2012-09-04 NOTE — Telephone Encounter (Signed)
Per [employer], all the urine leaked out of the container, they were unable to process and cancelled test.

## 2012-09-05 ENCOUNTER — Encounter (HOSPITAL_COMMUNITY): Payer: Self-pay | Admitting: Psychology

## 2012-09-05 LAB — PRESCRIPTION ABUSE MONITORING 17P, URINE
Barbiturate Screen, Urine: NEGATIVE ng/mL
Benzodiazepine Screen, Urine: NEGATIVE ng/mL
Buprenorphine, Urine: NEGATIVE ng/mL
Cannabinoid Scrn, Ur: NEGATIVE ng/mL
Carisoprodol, Urine: NEGATIVE ng/mL
Cocaine Metabolites: NEGATIVE ng/mL
Creatinine, Urine: 16.3 mg/dL — ABNORMAL LOW (ref >20.0)
Fentanyl, Ur: NEGATIVE ng/mL
MDMA URINE: NEGATIVE ng/mL
Meperidine, Ur: NEGATIVE ng/mL
Methadone Screen, Urine: NEGATIVE ng/mL
Opiate Screen, Urine: NEGATIVE ng/mL
Oxycodone Screen, Ur: NEGATIVE ng/mL
Propoxyphene: NEGATIVE ng/mL
Tapentadol, urine: NEGATIVE ng/mL
Tramadol Scrn, Ur: NEGATIVE ng/mL
Zolpidem, Urine: NEGATIVE ng/mL

## 2012-09-05 LAB — ALCOHOL METABOLITE (ETG), URINE

## 2012-09-05 NOTE — Progress Notes (Signed)
Daily Group Progress Note  Program: CD-IOP   Group Time: 1-2:30 pm  Participation Level: Active  Behavioral Response: Appropriate and Sharing  Type of Therapy: Group Process  Topic: Group Process: The first part of group was spent in process. Members shared about the past weekend and the things they did to support their recovery and remain abstinent. A number of members shared the struggles they had experienced. One member cried and expressed her anger at having this disease. Another admitted she and her S/O were really going to have to make some changes. A new member was present and she shared how she had stayed sober over the weekend, but admitted it was really difficult. There was good disclosure among group members.      Group Time: 2:45- 4 pm  Participation Level: Active  Behavioral Response: Sharing  Type of Therapy: Psycho-education Group  Topic: "Letting Go": A handout and psycho-ed presentation was made on the importance of "letting go". The emphasis was on recognizing and accepting that one cannot do for others all the time. One must let others find their way, but it doesn't in anyway mean they aren't loved. The focus of this presentation was how to be healthy, but loving to others, which means not doing for others what they can do for themselves. There was some good feedback and a number of members pointed out that they are caretakers and have spent their lives trying to make others happy. The futility of these efforts was identified and these group members were, instead, encouraged to focus on their own growth and satisfying their own needs.    Summary: The patient reported she had met her S/O after group on Friday and they had met another couple for [facility]. They hadn't thought about the fact that the other folks would be drinking. The patient admitted it was hard and she recognized that she probably shouldn't be around others who are drinking, but she doesn't want to be  a hermit! I reminded her that we haven't asked that she be a hermit, but that she pick and choose where she wants to be and with whom, especially in the early days of her recovery. She reported she had gone to a meeting over the weekend and it had been really helpful. Another group member had attended the meeting and she felt good about having someone she knew there. The patient had a tough time with the first statement in the handout on "Letting Go". She has a tough time not helping someone else and yet feeling as if she is caring from a distance. She admitted she has always helped everybody else before helping herself. Another member reminded her that she 'needed to put the oxygen mask on herself before anyone else".  The patient recognized this was true, but also reminded the group that she is a chameleon and adapting to another's needs or wants is what she is good at. The patient admitted to being very co-dependent and it is clear there is work to be done. She reported her sobriety date remains 4/2.    Family Program: Family present? No   Name of family member(s):   UDS collected: Yes Results: not yet back, but presumptive positive for etoh and amphetamines. It may take a few days for the Vyvance to get our of her system. There should be no alcohol  AA/NA attended?: [employer]?: No, but she is new to the program.    Erial Fikes, LCAS

## 2012-09-06 ENCOUNTER — Other Ambulatory Visit (HOSPITAL_COMMUNITY): Payer: BC Managed Care – PPO | Admitting: Psychology

## 2012-09-06 DIAGNOSIS — F319 Bipolar disorder, unspecified: Secondary | ICD-10-CM

## 2012-09-06 DIAGNOSIS — F1020 Alcohol dependence, uncomplicated: Secondary | ICD-10-CM

## 2012-09-06 LAB — AMPHETAMINES (GC/LC/MS), URINE
Amphetamine GC/MS Conf: >1000 ng/mL — ABNORMAL HIGH
MDA GC/MS confirm: NEGATIVE ng/mL
MDEA GC/MS Conf: NEGATIVE ng/mL
MDMA GC/MS Conf: NEGATIVE ng/mL
Methamphetamine Quant, Ur: NEGATIVE ng/mL

## 2012-09-07 ENCOUNTER — Telehealth (HOSPITAL_COMMUNITY): Payer: Self-pay | Admitting: Psychology

## 2012-09-07 ENCOUNTER — Encounter (HOSPITAL_COMMUNITY): Payer: Self-pay | Admitting: Psychology

## 2012-09-08 ENCOUNTER — Other Ambulatory Visit (HOSPITAL_COMMUNITY): Payer: BC Managed Care – PPO

## 2012-09-11 ENCOUNTER — Telehealth (HOSPITAL_COMMUNITY): Payer: Self-pay | Admitting: Psychology

## 2012-09-11 ENCOUNTER — Encounter (HOSPITAL_COMMUNITY): Payer: Self-pay | Admitting: Psychology

## 2012-09-11 ENCOUNTER — Other Ambulatory Visit (HOSPITAL_COMMUNITY): Payer: BC Managed Care – PPO

## 2012-09-11 NOTE — Progress Notes (Signed)
    Daily Group Progress Note  Program: CD-IOP   Group Time: 1-2:30 pm  Participation Level: Active  Behavioral Response: Appropriate and Sharing  Type of Therapy: Process Group  Topic: Group Process: Members shared about their current struggles and issues in early recovery. They shared what they are doing to support their recovery. There was good feedback and disclosure among the members.   Group Time: 2:45- 4pm  Participation Level: Active  Behavioral Response: Sharing  Type of Therapy: Psycho-education Group  Topic: [facility] Slide Show/Honesty about Drug Tests: Triggers, Cravings, and Recovery. The second half of group was spent in a psych-ed presentation with slide shows from [facility]. Members were provided education on the way triggers develop and the strengthening of the condition response to cues as one's addiction progresses. The second show focused on the 4 phases of recovery and obstacles one can anticipate. At the conclusion of these presentations, two members were challenged about the results of the drug test, both of which indicated alcohol use. They both admitted using while previously neither had confessed their use despite both emphasizing the importance of honesty in recovery.   Summary: The patient reported she was doing okay. She praised the youngest group member for having sought help when she did as opposed to waiting 20 years, like she had, to seek treatment. The patient engaged in the discussion on Triggers and the subsequent relapse progression and made some good comments. After the break, I noted that her drug test from Monday had a presumptive positive for alcohol. I wondered if she had drunk? The patient denied drinking, but she admitted she had drunk the [place] concoction she and her S/O make, which has vodka in it. She and her daughter had made brownies and she had put this Vanilla into the cookie dough. The patient admitted that she had  recognized the vodka had not evaporated and was still potent, but she had eaten half the raw cookie dough anyway. She got very teary and reported that this was much harder than she had ever recognized. The patient reported she had been addicted to other things, but always just switched addictions. Now, the patient explained, she has to change her inner self, which is the really hard part. The patient received good feedback and has done very well in being open and honest as well as she can be. Her sobriety date is 4/12.   Family Program: Family present? No   Name of family member(s):   UDS collected: No Results:   AA/NA attended?: [place]  Sponsor?: No, but she is new and still becoming familiar with the recovery movement   Laniqua Torrens, LCAS

## 2012-09-11 NOTE — Progress Notes (Unsigned)
Patient ID: Ana Rose, female   DOB: 1975-10-17, 37 y.o.   MRN: [id number] The patient and I had returned phone messages after she left a message early this morning informing me that she had drank last night. She called me again later in the morning and reported she had met with her psychiatrist, Dr. Wellington Hampshire, at the [facility] in [place]. She reported that he was very angry with the staff here at [facility] and had told her to stop taking all of the medications she had been prescribed while here. He informed her that all of those medications only increased symptoms of Bipolar Disorder. He was also furious that our Wellsite geologist, AW, had instructed her to stop taking the Vyvanse and change over to [place]. Dr. Quintella Reichert told the patient that Strattera only makes Bipolar Disorder worse and she shouldn't take it. He told her to stop coming to this CD-IOP program and return to the IOP at the [facility] where she had been attending for almost 5 months. She had told me they didn't take drug tests and she had been drinking the entire time she was there. The patient admitted she didn't know what to do and felt very confused. "I know that my brain isn't working good enough for me to make good decisions, and I don't know what to do", she explained. She felt like she was in a tug of war with both sides pulling on her. I assured her that I would speak with our medical team, but we do not believe or agree with his assessment and I felt she would benefit more from a program that holds her accountable. I emphasized that her well-being and health was our primary focus and she needed to do what she felt was best. The patient thanked me and noted that her S/O had told her there was no rush to make a decision and they could wait. We will wait to hear from this patient.

## 2012-09-13 ENCOUNTER — Other Ambulatory Visit (HOSPITAL_COMMUNITY): Payer: BC Managed Care – PPO

## 2012-09-15 ENCOUNTER — Other Ambulatory Visit (HOSPITAL_COMMUNITY): Payer: BC Managed Care – PPO

## 2012-09-16 LAB — ETHYL GLUCURONIDE, URINE: Ethyl Glucuronide (EtG): 11888 ng/mL — ABNORMAL HIGH

## 2012-09-18 ENCOUNTER — Other Ambulatory Visit (HOSPITAL_COMMUNITY): Payer: BC Managed Care – PPO

## 2012-09-20 ENCOUNTER — Other Ambulatory Visit (HOSPITAL_COMMUNITY): Payer: BC Managed Care – PPO | Admitting: Psychology

## 2012-09-22 ENCOUNTER — Other Ambulatory Visit (HOSPITAL_COMMUNITY): Payer: BC Managed Care – PPO | Attending: Psychiatry

## 2012-09-25 ENCOUNTER — Other Ambulatory Visit (HOSPITAL_COMMUNITY): Payer: BC Managed Care – PPO

## 2012-09-27 ENCOUNTER — Other Ambulatory Visit (HOSPITAL_COMMUNITY): Payer: BC Managed Care – PPO

## 2012-09-29 ENCOUNTER — Other Ambulatory Visit (HOSPITAL_COMMUNITY): Payer: BC Managed Care – PPO

## 2012-10-02 ENCOUNTER — Other Ambulatory Visit (HOSPITAL_COMMUNITY): Payer: BC Managed Care – PPO

## 2012-10-04 ENCOUNTER — Other Ambulatory Visit (HOSPITAL_COMMUNITY): Payer: BC Managed Care – PPO

## 2012-10-06 ENCOUNTER — Other Ambulatory Visit (HOSPITAL_COMMUNITY): Payer: BC Managed Care – PPO

## 2012-10-09 ENCOUNTER — Other Ambulatory Visit (HOSPITAL_COMMUNITY): Payer: BC Managed Care – PPO

## 2012-10-11 ENCOUNTER — Other Ambulatory Visit (HOSPITAL_COMMUNITY): Payer: BC Managed Care – PPO

## 2012-10-13 ENCOUNTER — Other Ambulatory Visit (HOSPITAL_COMMUNITY): Payer: BC Managed Care – PPO

## 2012-10-18 ENCOUNTER — Other Ambulatory Visit (HOSPITAL_COMMUNITY): Payer: BC Managed Care – PPO

## 2012-10-20 ENCOUNTER — Other Ambulatory Visit (HOSPITAL_COMMUNITY): Payer: BC Managed Care – PPO

## 2012-10-23 ENCOUNTER — Other Ambulatory Visit (HOSPITAL_COMMUNITY): Payer: BC Managed Care – PPO

## 2012-10-25 ENCOUNTER — Other Ambulatory Visit (HOSPITAL_COMMUNITY): Payer: BC Managed Care – PPO

## 2012-10-27 ENCOUNTER — Other Ambulatory Visit (HOSPITAL_COMMUNITY): Payer: BC Managed Care – PPO

## 2012-10-27 ENCOUNTER — Ambulatory Visit: Payer: Self-pay | Admitting: Gynecology

## 2012-10-30 ENCOUNTER — Other Ambulatory Visit (HOSPITAL_COMMUNITY): Payer: BC Managed Care – PPO

## 2012-11-01 ENCOUNTER — Other Ambulatory Visit (HOSPITAL_COMMUNITY): Payer: BC Managed Care – PPO

## 2012-11-03 ENCOUNTER — Other Ambulatory Visit (HOSPITAL_COMMUNITY): Payer: BC Managed Care – PPO

## 2013-08-06 ENCOUNTER — Ambulatory Visit (INDEPENDENT_AMBULATORY_CARE_PROVIDER_SITE_OTHER): Payer: BC Managed Care – PPO | Admitting: Gynecology

## 2013-08-06 ENCOUNTER — Encounter: Payer: Self-pay | Admitting: Gynecology

## 2013-08-06 VITALS — BP 134/84 | Ht 66.0 in | Wt 159.0 lb

## 2013-08-06 DIAGNOSIS — Z01419 Encounter for gynecological examination (general) (routine) without abnormal findings: Secondary | ICD-10-CM

## 2013-08-06 LAB — CBC WITH DIFFERENTIAL/PLATELET
Basophils Absolute: 0 10*3/uL (ref 0.0–0.1)
Basophils Relative: 1 % (ref 0–1)
Eosinophils Absolute: 0 10*3/uL (ref 0.0–0.7)
Eosinophils Relative: 0 % (ref 0–5)
HCT: 35.1 % — ABNORMAL LOW (ref 36.0–46.0)
Hemoglobin: 11.8 g/dL — ABNORMAL LOW (ref 12.0–15.0)
Lymphocytes Relative: 42 % (ref 12–46)
Lymphs Abs: 1.7 10*3/uL (ref 0.7–4.0)
MCH: 31 pg (ref 26.0–34.0)
MCHC: 33.6 g/dL (ref 30.0–36.0)
MCV: 92.1 fL (ref 78.0–100.0)
Monocytes Absolute: 0.5 10*3/uL (ref 0.1–1.0)
Monocytes Relative: 12 % (ref 3–12)
Neutro Abs: 1.8 10*3/uL (ref 1.7–7.7)
Neutrophils Relative %: 45 % (ref 43–77)
Platelets: 302 10*3/uL (ref 150–400)
RBC: 3.81 MIL/uL — ABNORMAL LOW (ref 3.87–5.11)
RDW: 14.4 % (ref 11.5–15.5)
WBC: 4 10*3/uL (ref 4.0–10.5)

## 2013-08-06 NOTE — Patient Instructions (Signed)
Followup in one year for annual exam.  You may obtain a copy of any labs that were done today by logging onto MyChart as outlined in the instructions provided with your AVS (after visit summary). The office will not call with normal lab results but certainly if there are any significant abnormalities then we will contact you.   Health Maintenance, Female A healthy lifestyle and preventative care can promote health and wellness.  Maintain regular health, dental, and eye exams.  Eat a healthy diet. Foods like vegetables, fruits, whole grains, low-fat dairy products, and lean protein foods contain the nutrients you need without too many calories. Decrease your intake of foods high in solid fats, added sugars, and salt. Get information about a proper diet from your caregiver, if necessary.  Regular physical exercise is one of the most important things you can do for your health. Most adults should get at least 150 minutes of moderate-intensity exercise (any activity that increases your heart rate and causes you to sweat) each week. In addition, most adults need muscle-strengthening exercises on 2 or more days a week.   Maintain a healthy weight. The body mass index (BMI) is a screening tool to identify possible weight problems. It provides an estimate of body fat based on height and weight. Your caregiver can help determine your BMI, and can help you achieve or maintain a healthy weight. For adults 20 years and older:  A BMI below 18.5 is considered underweight.  A BMI of 18.5 to 24.9 is normal.  A BMI of 25 to 29.9 is considered overweight.  A BMI of 30 and above is considered obese.  Maintain normal blood lipids and cholesterol by exercising and minimizing your intake of saturated fat. Eat a balanced diet with plenty of fruits and vegetables. Blood tests for lipids and cholesterol should begin at age 10 and be repeated every 5 years. If your lipid or cholesterol levels are high, you are over  50, or you are a high risk for heart disease, you may need your cholesterol levels checked more frequently.Ongoing high lipid and cholesterol levels should be treated with medicines if diet and exercise are not effective.  If you smoke, find out from your caregiver how to quit. If you do not use tobacco, do not start.  Lung cancer screening is recommended for adults aged 61 80 years who are at high risk for developing lung cancer because of a history of smoking. Yearly low-dose computed tomography (CT) is recommended for people who have at least a 30-pack-year history of smoking and are a current smoker or have quit within the past 15 years. A pack year of smoking is smoking an average of 1 pack of cigarettes a day for 1 year (for example: 1 pack a day for 30 years or 2 packs a day for 15 years). Yearly screening should continue until the smoker has stopped smoking for at least 15 years. Yearly screening should also be stopped for people who develop a health problem that would prevent them from having lung cancer treatment.  If you are pregnant, do not drink alcohol. If you are breastfeeding, be very cautious about drinking alcohol. If you are not pregnant and choose to drink alcohol, do not exceed 1 drink per day. One drink is considered to be 12 ounces (355 mL) of beer, 5 ounces (148 mL) of wine, or 1.5 ounces (44 mL) of liquor.  Avoid use of street drugs. Do not share needles with anyone. Ask for help  if you need support or instructions about stopping the use of drugs.  High blood pressure causes heart disease and increases the risk of stroke. Blood pressure should be checked at least every 1 to 2 years. Ongoing high blood pressure should be treated with medicines, if weight loss and exercise are not effective.  If you are 45 to 39 years old, ask your caregiver if you should take aspirin to prevent strokes.  Diabetes screening involves taking a blood sample to check your fasting blood sugar level.  This should be done once every 3 years, after age 58, if you are within normal weight and without risk factors for diabetes. Testing should be considered at a younger age or be carried out more frequently if you are overweight and have at least 1 risk factor for diabetes.  Breast cancer screening is essential preventative care for women. You should practice "breast self-awareness." This means understanding the normal appearance and feel of your breasts and may include breast self-examination. Any changes detected, no matter how small, should be reported to a caregiver. Women in their 45s and 30s should have a clinical breast exam (CBE) by a caregiver as part of a regular health exam every 1 to 3 years. After age 58, women should have a CBE every year. Starting at age 11, women should consider having a mammogram (breast X-ray) every year. Women who have a family history of breast cancer should talk to their caregiver about genetic screening. Women at a high risk of breast cancer should talk to their caregiver about having an MRI and a mammogram every year.  Breast cancer gene (BRCA)-related cancer risk assessment is recommended for women who have family members with BRCA-related cancers. BRCA-related cancers include breast, ovarian, tubal, and peritoneal cancers. Having family members with these cancers may be associated with an increased risk for harmful changes (mutations) in the breast cancer genes BRCA1 and BRCA2. Results of the assessment will determine the need for genetic counseling and BRCA1 and BRCA2 testing.  The Pap test is a screening test for cervical cancer. Women should have a Pap test starting at age 78. Between ages 57 and 22, Pap tests should be repeated every 2 years. Beginning at age 81, you should have a Pap test every 3 years as long as the past 3 Pap tests have been normal. If you had a hysterectomy for a problem that was not cancer or a condition that could lead to cancer, then you no  longer need Pap tests. If you are between ages 18 and 67, and you have had normal Pap tests going back 10 years, you no longer need Pap tests. If you have had past treatment for cervical cancer or a condition that could lead to cancer, you need Pap tests and screening for cancer for at least 20 years after your treatment. If Pap tests have been discontinued, risk factors (such as a new sexual partner) need to be reassessed to determine if screening should be resumed. Some women have medical problems that increase the chance of getting cervical cancer. In these cases, your caregiver may recommend more frequent screening and Pap tests.  The human papillomavirus (HPV) test is an additional test that may be used for cervical cancer screening. The HPV test looks for the virus that can cause the cell changes on the cervix. The cells collected during the Pap test can be tested for HPV. The HPV test could be used to screen women aged 3 years and older, and  should be used in women of any age who have unclear Pap test results. After the age of 43, women should have HPV testing at the same frequency as a Pap test.  Colorectal cancer can be detected and often prevented. Most routine colorectal cancer screening begins at the age of 93 and continues through age 31. However, your caregiver may recommend screening at an earlier age if you have risk factors for colon cancer. On a yearly basis, your caregiver may provide home test kits to check for hidden blood in the stool. Use of a small camera at the end of a tube, to directly examine the colon (sigmoidoscopy or colonoscopy), can detect the earliest forms of colorectal cancer. Talk to your caregiver about this at age 28, when routine screening begins. Direct examination of the colon should be repeated every 5 to 10 years through age 9, unless early forms of pre-cancerous polyps or small growths are found.  Hepatitis C blood testing is recommended for all people born from  [birth year] through 1965 and any individual with known risks for hepatitis C.  Practice safe sex. Use condoms and avoid high-risk sexual practices to reduce the spread of sexually transmitted infections (STIs). Sexually active women aged 66 and younger should be checked for Chlamydia, which is a common sexually transmitted infection. Older women with new or multiple partners should also be tested for Chlamydia. Testing for other STIs is recommended if you are sexually active and at increased risk.  Osteoporosis is a disease in which the bones lose minerals and strength with aging. This can result in serious bone fractures. The risk of osteoporosis can be identified using a bone density scan. Women ages 70 and over and women at risk for fractures or osteoporosis should discuss screening with their caregivers. Ask your caregiver whether you should be taking a calcium supplement or vitamin D to reduce the rate of osteoporosis.  Menopause can be associated with physical symptoms and risks. Hormone replacement therapy is available to decrease symptoms and risks. You should talk to your caregiver about whether hormone replacement therapy is right for you.  Use sunscreen. Apply sunscreen liberally and repeatedly throughout the day. You should seek shade when your shadow is shorter than you. Protect yourself by wearing long sleeves, pants, a wide-brimmed hat, and sunglasses year round, whenever you are outdoors.  Notify your caregiver of new moles or changes in moles, especially if there is a change in shape or color. Also notify your caregiver if a mole is larger than the size of a pencil eraser.  Stay current with your immunizations. Document Released: 11/23/2010 Document Revised: 09/04/2012 Document Reviewed: 11/23/2010 [facility] Patient Information 2014 [place].

## 2013-08-06 NOTE — Progress Notes (Signed)
Ana Rose [birth year] [id number]        37 y.o.  G1P1001 for annual exam.  Doing well without complaints.  Past medical history,surgical history, problem list, medications, allergies, family history and social history were all reviewed and documented in the EPIC chart.  ROS:  Performed and pertinent positives and negatives are included in the history, assessment and plan .  Exam: Kim assistant Filed Vitals:   08/06/13 1513  BP: 134/84  Height: 5\' 6"  (1.676 m)  Weight: 159 lb (72.122 kg)   General appearance  Normal Skin grossly normal Head/Neck normal with no cervical or supraclavicular adenopathy thyroid normal Lungs  clear Cardiac RR, without RMG Abdominal  soft, nontender, without masses, organomegaly or hernia Breasts  examined lying and sitting without masses, retractions, discharge or axillary adenopathy. Pelvic  Ext/BUS/vagina normal  Adnexa  Without masses or tenderness    Anus and perineum  Normal   Rectovaginal  Normal sphincter tone without palpated masses or tenderness.    Assessment/Plan:  38 y.o. G16P1001 female for annual exam.   1. History TLH 2012 with leiomyoma, dysmenorrhea, menorrhagia, endometriosis. Doing well without complaints. No menopausal symptoms. Will continue to monitor. 2. Mammography 07/2012. No strong family history of breast cancer. Plan repeat at age 19. SBE monthly reviewed. 3. Pap smear 2013. No Pap smear done today. History of cryosurgery around age 47 with normal intervening Pap smears. Discussed current screening guidelines an option cystoscopy altogether or less frequent screening intervals reviewed. Patient would prefer screening at least for now all plan repeat Pap smear next year 3 year interval. 4. Health maintenance. Baseline CBC comprehensive metabolic panel lipid profile urinalysis ordered. Mild elevated blood pressure noted to the patient at 134/84. Asked the patient had rechecked in a non- exam situation. If it would continue  elevated then I asked her to followup with her primary physician for further followup. Otherwise followup in one year, sooner as needed.  Note: This document was prepared with digital dictation and possible smart phrase technology. Any transcriptional errors that result from this process are unintentional.   [name] MD, 3:38 PM 08/06/2013

## 2013-08-07 LAB — COMPREHENSIVE METABOLIC PANEL
ALT: 21 U/L (ref 0–35)
AST: 33 U/L (ref 0–37)
Albumin: 4.5 g/dL (ref 3.5–5.2)
Alkaline Phosphatase: 51 U/L (ref 39–117)
BUN: 11 mg/dL (ref 6–23)
CO2: 28 mEq/L (ref 19–32)
Calcium: 9 mg/dL (ref 8.4–10.5)
Chloride: 101 mEq/L (ref 96–112)
Creat: 0.74 mg/dL (ref 0.50–1.10)
Glucose, Bld: 88 mg/dL (ref 70–99)
Potassium: 4.2 mEq/L (ref 3.5–5.3)
Sodium: 138 mEq/L (ref 135–145)
Total Bilirubin: 0.3 mg/dL (ref 0.2–1.2)
Total Protein: 6.7 g/dL (ref 6.0–8.3)

## 2013-08-07 LAB — URINALYSIS W MICROSCOPIC + REFLEX CULTURE
Bacteria, UA: NONE SEEN
Bilirubin Urine: NEGATIVE
Casts: NONE SEEN
Crystals: NONE SEEN
Glucose, UA: NEGATIVE mg/dL
Hgb urine dipstick: NEGATIVE
Ketones, ur: NEGATIVE mg/dL
Leukocytes, UA: NEGATIVE
Nitrite: NEGATIVE
Protein, ur: NEGATIVE mg/dL
Specific Gravity, Urine: 1.021 (ref 1.005–1.030)
Urobilinogen, UA: 0.2 mg/dL (ref 0.0–1.0)
pH: 7.5 (ref 5.0–8.0)

## 2013-08-07 LAB — LIPID PANEL
Cholesterol: 231 mg/dL — ABNORMAL HIGH (ref 0–200)
HDL: 125 mg/dL (ref >39)
LDL Cholesterol: 93 mg/dL (ref 0–99)
Total CHOL/HDL Ratio: 1.8 Ratio
Triglycerides: 65 mg/dL (ref <150)
VLDL: 13 mg/dL (ref 0–40)

## 2013-10-24 ENCOUNTER — Encounter: Payer: Self-pay | Admitting: Gynecology

## 2014-03-25 ENCOUNTER — Encounter: Payer: Self-pay | Admitting: Gynecology

## 2014-08-08 ENCOUNTER — Encounter: Payer: Self-pay | Admitting: Gynecology

## 2014-08-08 ENCOUNTER — Other Ambulatory Visit (HOSPITAL_COMMUNITY)
Admission: RE | Admit: 2014-08-08 | Discharge: 2014-08-08 | Disposition: A | Discharge status: Home / Self Care | Payer: BLUE CROSS/BLUE SHIELD | Source: Ambulatory Visit | Attending: Gynecology | Admitting: Gynecology

## 2014-08-08 ENCOUNTER — Ambulatory Visit (INDEPENDENT_AMBULATORY_CARE_PROVIDER_SITE_OTHER): Payer: BLUE CROSS/BLUE SHIELD | Admitting: Gynecology

## 2014-08-08 VITALS — BP 120/74 | Ht 67.0 in | Wt 165.0 lb

## 2014-08-08 DIAGNOSIS — Z01419 Encounter for gynecological examination (general) (routine) without abnormal findings: Secondary | ICD-10-CM | POA: Diagnosis not present

## 2014-08-08 LAB — CBC WITH DIFFERENTIAL/PLATELET
Basophils Absolute: 0 10*3/uL (ref 0.0–0.1)
Basophils Relative: 1 % (ref 0–1)
Eosinophils Absolute: 0 10*3/uL (ref 0.0–0.7)
Eosinophils Relative: 0 % (ref 0–5)
HCT: 37 % (ref 36.0–46.0)
Hemoglobin: 12.5 g/dL (ref 12.0–15.0)
Lymphocytes Relative: 30 % (ref 12–46)
Lymphs Abs: 1.1 10*3/uL (ref 0.7–4.0)
MCH: 31.6 pg (ref 26.0–34.0)
MCHC: 33.8 g/dL (ref 30.0–36.0)
MCV: 93.7 fL (ref 78.0–100.0)
MPV: 9.5 fL (ref 8.6–12.4)
Monocytes Absolute: 0.5 10*3/uL (ref 0.1–1.0)
Monocytes Relative: 14 % — ABNORMAL HIGH (ref 3–12)
Neutro Abs: 2.1 10*3/uL (ref 1.7–7.7)
Neutrophils Relative %: 55 % (ref 43–77)
Platelets: 324 10*3/uL (ref 150–400)
RBC: 3.95 MIL/uL (ref 3.87–5.11)
RDW: 14.7 % (ref 11.5–15.5)
WBC: 3.8 10*3/uL — ABNORMAL LOW (ref 4.0–10.5)

## 2014-08-08 NOTE — Progress Notes (Signed)
KYNNADI DICENSO [birth year] [id number]        39 y.o.  G1P1001 for annual exam.  Doing well without complaints.  Past medical history,surgical history, problem list, medications, allergies, family history and social history were all reviewed and documented as reviewed in the EPIC chart.  ROS:  Performed with pertinent positives and negatives included in the history, assessment and plan.   Additional significant findings :  none   Exam: Kim Counsellor Vitals:   08/08/14 1603  BP: 120/74  Height: 5\' 7"  (1.702 m)  Weight: 165 lb (74.844 kg)   General appearance:  Normal affect, orientation and appearance. Skin: Grossly normal HEENT: Without gross lesions.  No cervical or supraclavicular adenopathy. Thyroid normal.  Lungs:  Clear without wheezing, rales or rhonchi Cardiac: RR, without RMG Abdominal:  Soft, nontender, without masses, guarding, rebound, organomegaly or hernia Breasts:  Examined lying and sitting without masses, retractions, discharge or axillary adenopathy. Pelvic:  Ext/BUS/vagina normal Pap smear of cuff done  Adnexa  Without masses or tenderness    Anus and perineum  Normal   Rectovaginal  Normal sphincter tone without palpated masses or tenderness.    Assessment/Plan:  39 y.o. G74P1001 female for annual exam.   1. History of [place] 2012 for leiomyoma, dysmenorrhea, menorrhagia, endometriosis. Doing well without complaints. Continue to monitor. 2. Mammography 10/2013. Continue with annual mammography. SBE monthly reviewed. 3. Pap smear 2013. Pap smear of vaginal cuff done today. History of cryosurgery approximately age 8 with normal Pap smears since. Options to stop screening as she is status post hysterectomy for benign indications versus less frequent screening intervals reviewed. Will readdress on annual basis. 4. Health maintenance. Baseline CBC, comprehensive metabolic panel, lipid profile, urinalysis ordered. Follow up in one year, sooner as  needed.     [name] MD, 4:27 PM 08/08/2014

## 2014-08-08 NOTE — Patient Instructions (Signed)
Follow up in one year for annual exam, sooner if any issues.  You may obtain a copy of any labs that were done today by logging onto MyChart as outlined in the instructions provided with your AVS (after visit summary). The office will not call with normal lab results but certainly if there are any significant abnormalities then we will contact you.   Health Maintenance, Female A healthy lifestyle and preventative care can promote health and wellness.  Maintain regular health, dental, and eye exams.  Eat a healthy diet. Foods like vegetables, fruits, whole grains, low-fat dairy products, and lean protein foods contain the nutrients you need without too many calories. Decrease your intake of foods high in solid fats, added sugars, and salt. Get information about a proper diet from your caregiver, if necessary.  Regular physical exercise is one of the most important things you can do for your health. Most adults should get at least 150 minutes of moderate-intensity exercise (any activity that increases your heart rate and causes you to sweat) each week. In addition, most adults need muscle-strengthening exercises on 2 or more days a week.   Maintain a healthy weight. The body mass index (BMI) is a screening tool to identify possible weight problems. It provides an estimate of body fat based on height and weight. Your caregiver can help determine your BMI, and can help you achieve or maintain a healthy weight. For adults 20 years and older:  A BMI below 18.5 is considered underweight.  A BMI of 18.5 to 24.9 is normal.  A BMI of 25 to 29.9 is considered overweight.  A BMI of 30 and above is considered obese.  Maintain normal blood lipids and cholesterol by exercising and minimizing your intake of saturated fat. Eat a balanced diet with plenty of fruits and vegetables. Blood tests for lipids and cholesterol should begin at age 57 and be repeated every 5 years. If your lipid or cholesterol levels  are high, you are over 50, or you are a high risk for heart disease, you may need your cholesterol levels checked more frequently.Ongoing high lipid and cholesterol levels should be treated with medicines if diet and exercise are not effective.  If you smoke, find out from your caregiver how to quit. If you do not use tobacco, do not start.  Lung cancer screening is recommended for adults aged 33 80 years who are at high risk for developing lung cancer because of a history of smoking. Yearly low-dose computed tomography (CT) is recommended for people who have at least a 30-pack-year history of smoking and are a current smoker or have quit within the past 15 years. A pack year of smoking is smoking an average of 1 pack of cigarettes a day for 1 year (for example: 1 pack a day for 30 years or 2 packs a day for 15 years). Yearly screening should continue until the smoker has stopped smoking for at least 15 years. Yearly screening should also be stopped for people who develop a health problem that would prevent them from having lung cancer treatment.  If you are pregnant, do not drink alcohol. If you are breastfeeding, be very cautious about drinking alcohol. If you are not pregnant and choose to drink alcohol, do not exceed 1 drink per day. One drink is considered to be 12 ounces (355 mL) of beer, 5 ounces (148 mL) of wine, or 1.5 ounces (44 mL) of liquor.  Avoid use of street drugs. Do not share needles  with anyone. Ask for help if you need support or instructions about stopping the use of drugs.  High blood pressure causes heart disease and increases the risk of stroke. Blood pressure should be checked at least every 1 to 2 years. Ongoing high blood pressure should be treated with medicines, if weight loss and exercise are not effective.  If you are 4 to 40 years old, ask your caregiver if you should take aspirin to prevent strokes.  Diabetes screening involves taking a blood sample to check your  fasting blood sugar level. This should be done once every 3 years, after age 74, if you are within normal weight and without risk factors for diabetes. Testing should be considered at a younger age or be carried out more frequently if you are overweight and have at least 1 risk factor for diabetes.  Breast cancer screening is essential preventative care for women. You should practice "breast self-awareness." This means understanding the normal appearance and feel of your breasts and may include breast self-examination. Any changes detected, no matter how small, should be reported to a caregiver. Women in their 71s and 30s should have a clinical breast exam (CBE) by a caregiver as part of a regular health exam every 1 to 3 years. After age 53, women should have a CBE every year. Starting at age 59, women should consider having a mammogram (breast X-ray) every year. Women who have a family history of breast cancer should talk to their caregiver about genetic screening. Women at a high risk of breast cancer should talk to their caregiver about having an MRI and a mammogram every year.  Breast cancer gene (BRCA)-related cancer risk assessment is recommended for women who have family members with BRCA-related cancers. BRCA-related cancers include breast, ovarian, tubal, and peritoneal cancers. Having family members with these cancers may be associated with an increased risk for harmful changes (mutations) in the breast cancer genes BRCA1 and BRCA2. Results of the assessment will determine the need for genetic counseling and BRCA1 and BRCA2 testing.  The Pap test is a screening test for cervical cancer. Women should have a Pap test starting at age 51. Between ages 11 and 57, Pap tests should be repeated every 2 years. Beginning at age 1, you should have a Pap test every 3 years as long as the past 3 Pap tests have been normal. If you had a hysterectomy for a problem that was not cancer or a condition that could  lead to cancer, then you no longer need Pap tests. If you are between ages 54 and 56, and you have had normal Pap tests going back 10 years, you no longer need Pap tests. If you have had past treatment for cervical cancer or a condition that could lead to cancer, you need Pap tests and screening for cancer for at least 20 years after your treatment. If Pap tests have been discontinued, risk factors (such as a new sexual partner) need to be reassessed to determine if screening should be resumed. Some women have medical problems that increase the chance of getting cervical cancer. In these cases, your caregiver may recommend more frequent screening and Pap tests.  The human papillomavirus (HPV) test is an additional test that may be used for cervical cancer screening. The HPV test looks for the virus that can cause the cell changes on the cervix. The cells collected during the Pap test can be tested for HPV. The HPV test could be used to screen women aged  30 years and older, and should be used in women of any age who have unclear Pap test results. After the age of 30, women should have HPV testing at the same frequency as a Pap test.  Colorectal cancer can be detected and often prevented. Most routine colorectal cancer screening begins at the age of 50 and continues through age 75. However, your caregiver may recommend screening at an earlier age if you have risk factors for colon cancer. On a yearly basis, your caregiver may provide home test kits to check for hidden blood in the stool. Use of a small camera at the end of a tube, to directly examine the colon (sigmoidoscopy or colonoscopy), can detect the earliest forms of colorectal cancer. Talk to your caregiver about this at age 50, when routine screening begins. Direct examination of the colon should be repeated every 5 to 10 years through age 75, unless early forms of pre-cancerous polyps or small growths are found.  Hepatitis C blood testing is  recommended for all people born from 1945 through 1965 and any individual with known risks for hepatitis C.  Practice safe sex. Use condoms and avoid high-risk sexual practices to reduce the spread of sexually transmitted infections (STIs). Sexually active women aged 25 and younger should be checked for Chlamydia, which is a common sexually transmitted infection. Older women with new or multiple partners should also be tested for Chlamydia. Testing for other STIs is recommended if you are sexually active and at increased risk.  Osteoporosis is a disease in which the bones lose minerals and strength with aging. This can result in serious bone fractures. The risk of osteoporosis can be identified using a bone density scan. Women ages 65 and over and women at risk for fractures or osteoporosis should discuss screening with their caregivers. Ask your caregiver whether you should be taking a calcium supplement or vitamin D to reduce the rate of osteoporosis.  Menopause can be associated with physical symptoms and risks. Hormone replacement therapy is available to decrease symptoms and risks. You should talk to your caregiver about whether hormone replacement therapy is right for you.  Use sunscreen. Apply sunscreen liberally and repeatedly throughout the day. You should seek shade when your shadow is shorter than you. Protect yourself by wearing long sleeves, pants, a wide-brimmed hat, and sunglasses year round, whenever you are outdoors.  Notify your caregiver of new moles or changes in moles, especially if there is a change in shape or color. Also notify your caregiver if a mole is larger than the size of a pencil eraser.  Stay current with your immunizations. Document Released: 11/23/2010 Document Revised: 09/04/2012 Document Reviewed: 11/23/2010 [employer] Patient Information 2014 [employer].   

## 2014-08-08 NOTE — Addendum Note (Signed)
Addended by: Nelva Nay on: 08/08/2014 04:32 PM   Modules accepted: Orders

## 2014-08-09 LAB — LIPID PANEL
Cholesterol: 297 mg/dL — ABNORMAL HIGH (ref 0–200)
HDL: >200 mg/dL (ref >=46)
Triglycerides: 56 mg/dL (ref <150)
VLDL: 11 mg/dL (ref 0–40)

## 2014-08-09 LAB — COMPREHENSIVE METABOLIC PANEL
ALT: 32 U/L (ref 0–35)
AST: 36 U/L (ref 0–37)
Albumin: 4.6 g/dL (ref 3.5–5.2)
Alkaline Phosphatase: 64 U/L (ref 39–117)
BUN: 8 mg/dL (ref 6–23)
CO2: 27 mEq/L (ref 19–32)
Calcium: 9.4 mg/dL (ref 8.4–10.5)
Chloride: 97 mEq/L (ref 96–112)
Creat: 0.69 mg/dL (ref 0.50–1.10)
Glucose, Bld: 104 mg/dL — ABNORMAL HIGH (ref 70–99)
Potassium: 4.2 mEq/L (ref 3.5–5.3)
Sodium: 136 mEq/L (ref 135–145)
Total Bilirubin: 0.4 mg/dL (ref 0.2–1.2)
Total Protein: 7 g/dL (ref 6.0–8.3)

## 2014-08-10 LAB — URINALYSIS W MICROSCOPIC + REFLEX CULTURE
Bacteria, UA: NONE SEEN
Bilirubin Urine: NEGATIVE
Casts: NONE SEEN
Crystals: NONE SEEN
Glucose, UA: NEGATIVE mg/dL
Hgb urine dipstick: NEGATIVE
Leukocytes, UA: NEGATIVE
Nitrite: NEGATIVE
Protein, ur: NEGATIVE mg/dL
Specific Gravity, Urine: 1.012 (ref 1.005–1.030)
Urobilinogen, UA: 0.2 mg/dL (ref 0.0–1.0)
pH: 7.5 (ref 5.0–8.0)

## 2014-08-12 ENCOUNTER — Other Ambulatory Visit: Payer: Self-pay | Admitting: Gynecology

## 2014-08-12 DIAGNOSIS — R7309 Other abnormal glucose: Secondary | ICD-10-CM

## 2014-08-12 DIAGNOSIS — E7800 Pure hypercholesterolemia, unspecified: Secondary | ICD-10-CM

## 2014-08-12 LAB — CYTOLOGY - PAP

## 2014-08-22 ENCOUNTER — Other Ambulatory Visit: Payer: Self-pay

## 2014-09-03 ENCOUNTER — Other Ambulatory Visit: Payer: BLUE CROSS/BLUE SHIELD

## 2014-09-03 DIAGNOSIS — E7800 Pure hypercholesterolemia, unspecified: Secondary | ICD-10-CM

## 2014-09-03 DIAGNOSIS — R7309 Other abnormal glucose: Secondary | ICD-10-CM

## 2014-09-03 LAB — LIPID PANEL
Cholesterol: 280 mg/dL — ABNORMAL HIGH (ref 0–200)
HDL: 192 mg/dL (ref >=46)
LDL Cholesterol: 76 mg/dL (ref 0–99)
Total CHOL/HDL Ratio: 1.5 Ratio
Triglycerides: 60 mg/dL (ref <150)
VLDL: 12 mg/dL (ref 0–40)

## 2014-09-04 LAB — GLUCOSE, FASTING: Glucose, Fasting: 76 mg/dL (ref 70–99)

## 2014-12-18 ENCOUNTER — Encounter: Payer: Self-pay | Admitting: Gynecology

## 2015-02-18 ENCOUNTER — Other Ambulatory Visit: Payer: Self-pay | Admitting: Family Medicine

## 2015-02-18 ENCOUNTER — Ambulatory Visit
Admission: RE | Admit: 2015-02-18 | Discharge: 2015-02-18 | Disposition: A | Discharge status: Home / Self Care | Payer: BLUE CROSS/BLUE SHIELD | Source: Ambulatory Visit | Attending: Family Medicine | Admitting: Family Medicine

## 2015-02-18 DIAGNOSIS — W19XXXA Unspecified fall, initial encounter: Secondary | ICD-10-CM

## 2015-04-02 ENCOUNTER — Ambulatory Visit: Payer: BLUE CROSS/BLUE SHIELD | Admitting: Podiatry

## 2015-08-11 ENCOUNTER — Ambulatory Visit (INDEPENDENT_AMBULATORY_CARE_PROVIDER_SITE_OTHER): Payer: BLUE CROSS/BLUE SHIELD | Admitting: Gynecology

## 2015-08-11 ENCOUNTER — Encounter: Payer: BLUE CROSS/BLUE SHIELD | Admitting: Gynecology

## 2015-08-11 ENCOUNTER — Encounter: Payer: Self-pay | Admitting: Gynecology

## 2015-08-11 VITALS — BP 124/80 | Ht 67.0 in | Wt 160.0 lb

## 2015-08-11 DIAGNOSIS — Z13220 Encounter for screening for lipoid disorders: Secondary | ICD-10-CM

## 2015-08-11 DIAGNOSIS — Z01419 Encounter for gynecological examination (general) (routine) without abnormal findings: Secondary | ICD-10-CM | POA: Diagnosis not present

## 2015-08-11 DIAGNOSIS — R102 Pelvic and perineal pain: Secondary | ICD-10-CM

## 2015-08-11 LAB — CBC WITH DIFFERENTIAL/PLATELET
Basophils Absolute: 0 10*3/uL (ref 0.0–0.1)
Basophils Relative: 1 % (ref 0–1)
Eosinophils Absolute: 0.1 10*3/uL (ref 0.0–0.7)
Eosinophils Relative: 3 % (ref 0–5)
HCT: 35.5 % — ABNORMAL LOW (ref 36.0–46.0)
Hemoglobin: 12.2 g/dL (ref 12.0–15.0)
Lymphocytes Relative: 48 % — ABNORMAL HIGH (ref 12–46)
Lymphs Abs: 1.3 10*3/uL (ref 0.7–4.0)
MCH: 32.4 pg (ref 26.0–34.0)
MCHC: 34.4 g/dL (ref 30.0–36.0)
MCV: 94.4 fL (ref 78.0–100.0)
MPV: 9.4 fL (ref 8.6–12.4)
Monocytes Absolute: 0.3 10*3/uL (ref 0.1–1.0)
Monocytes Relative: 11 % (ref 3–12)
Neutro Abs: 1 10*3/uL — ABNORMAL LOW (ref 1.7–7.7)
Neutrophils Relative %: 37 % — ABNORMAL LOW (ref 43–77)
Platelets: 318 10*3/uL (ref 150–400)
RBC: 3.76 MIL/uL — ABNORMAL LOW (ref 3.87–5.11)
RDW: 13.9 % (ref 11.5–15.5)
WBC: 2.8 10*3/uL — ABNORMAL LOW (ref 4.0–10.5)

## 2015-08-11 LAB — LIPID PANEL
Cholesterol: 266 mg/dL — ABNORMAL HIGH (ref 125–200)
HDL: 140 mg/dL (ref >=46)
LDL Cholesterol: 103 mg/dL (ref <130)
Total CHOL/HDL Ratio: 1.9 Ratio (ref <=5.0)
Triglycerides: 116 mg/dL (ref <150)
VLDL: 23 mg/dL (ref <30)

## 2015-08-11 LAB — COMPREHENSIVE METABOLIC PANEL
ALT: 20 U/L (ref 6–29)
AST: 26 U/L (ref 10–30)
Albumin: 4.2 g/dL (ref 3.6–5.1)
Alkaline Phosphatase: 72 U/L (ref 33–115)
BUN: 7 mg/dL (ref 7–25)
CO2: 26 mmol/L (ref 20–31)
Calcium: 8.8 mg/dL (ref 8.6–10.2)
Chloride: 100 mmol/L (ref 98–110)
Creat: 0.69 mg/dL (ref 0.50–1.10)
Glucose, Bld: 92 mg/dL (ref 65–99)
Potassium: 4.1 mmol/L (ref 3.5–5.3)
Sodium: 138 mmol/L (ref 135–146)
Total Bilirubin: 0.3 mg/dL (ref 0.2–1.2)
Total Protein: 6.8 g/dL (ref 6.1–8.1)

## 2015-08-11 LAB — URINALYSIS W MICROSCOPIC + REFLEX CULTURE
Bacteria, UA: NONE SEEN [HPF]
Bilirubin Urine: NEGATIVE
Casts: NONE SEEN [LPF]
Crystals: NONE SEEN [HPF]
Glucose, UA: NEGATIVE
Hgb urine dipstick: NEGATIVE
Ketones, ur: NEGATIVE
Leukocytes, UA: NEGATIVE
Nitrite: NEGATIVE
Protein, ur: NEGATIVE
RBC / HPF: NONE SEEN RBC/HPF (ref <=2)
Specific Gravity, Urine: 1.008 (ref 1.001–1.035)
Squamous Epithelial / LPF: NONE SEEN [HPF] (ref <=5)
WBC, UA: NONE SEEN WBC/HPF (ref <=5)
Yeast: NONE SEEN [HPF]
pH: 7.5 (ref 5.0–8.0)

## 2015-08-11 NOTE — Patient Instructions (Addendum)
Follow up for ultrasound as scheduled.  You may obtain a copy of any labs that were done today by logging onto MyChart as outlined in the instructions provided with your AVS (after visit summary). The office will not call with normal lab results but certainly if there are any significant abnormalities then we will contact you.   Health Maintenance Adopting a healthy lifestyle and getting preventive care can go a long way to promote health and wellness. Talk with your health care provider about what schedule of regular examinations is right for you. This is a good chance for you to check in with your provider about disease prevention and staying healthy. In between checkups, there are plenty of things you can do on your own. Experts have done a lot of research about which lifestyle changes and preventive measures are most likely to keep you healthy. Ask your health care provider for more information. WEIGHT AND DIET  Eat a healthy diet  Be sure to include plenty of vegetables, fruits, low-fat dairy products, and lean protein.  Do not eat a lot of foods high in solid fats, added sugars, or salt.  Get regular exercise. This is one of the most important things you can do for your health.  Most adults should exercise for at least 150 minutes each week. The exercise should increase your heart rate and make you sweat (moderate-intensity exercise).  Most adults should also do strengthening exercises at least twice a week. This is in addition to the moderate-intensity exercise.  Maintain a healthy weight  Body mass index (BMI) is a measurement that can be used to identify possible weight problems. It estimates body fat based on height and weight. Your health care provider can help determine your BMI and help you achieve or maintain a healthy weight.  For females 20 years of age and older:   A BMI below 18.5 is considered underweight.  A BMI of 18.5 to 24.9 is normal.  A BMI of 25 to 29.9 is  considered overweight.  A BMI of 30 and above is considered obese.  Watch levels of cholesterol and blood lipids  You should start having your blood tested for lipids and cholesterol at 41 years of age, then have this test every 5 years.  You may need to have your cholesterol levels checked more often if:  Your lipid or cholesterol levels are high.  You are older than 41 years of age.  You are at high risk for heart disease.  CANCER SCREENING   Lung Cancer  Lung cancer screening is recommended for adults 55-80 years old who are at high risk for lung cancer because of a history of smoking.  A yearly low-dose CT scan of the lungs is recommended for people who:  Currently smoke.  Have quit within the past 15 years.  Have at least a 30-pack-year history of smoking. A pack year is smoking an average of one pack of cigarettes a day for 1 year.  Yearly screening should continue until it has been 15 years since you quit.  Yearly screening should stop if you develop a health problem that would prevent you from having lung cancer treatment.  Breast Cancer  Practice breast self-awareness. This means understanding how your breasts normally appear and feel.  It also means doing regular breast self-exams. Let your health care provider know about any changes, no matter how small.  If you are in your 20s or 30s, you should have a clinical breast exam (CBE)   by a health care provider every 1-3 years as part of a regular health exam.  If you are 22 or older, have a CBE every year. Also consider having a breast X-ray (mammogram) every year.  If you have a family history of breast cancer, talk to your health care provider about genetic screening.  If you are at high risk for breast cancer, talk to your health care provider about having an MRI and a mammogram every year.  Breast cancer gene (BRCA) assessment is recommended for women who have family members with BRCA-related cancers.  BRCA-related cancers include:  Breast.  Ovarian.  Tubal.  Peritoneal cancers.  Results of the assessment will determine the need for genetic counseling and BRCA1 and BRCA2 testing. Cervical Cancer Routine pelvic examinations to screen for cervical cancer are no longer recommended for nonpregnant women who are considered low risk for cancer of the pelvic organs (ovaries, uterus, and vagina) and who do not have symptoms. A pelvic examination may be necessary if you have symptoms including those associated with pelvic infections. Ask your health care provider if a screening pelvic exam is right for you.   The Pap test is the screening test for cervical cancer for women who are considered at risk.  If you had a hysterectomy for a problem that was not cancer or a condition that could lead to cancer, then you no longer need Pap tests.  If you are older than 65 years, and you have had normal Pap tests for the past 10 years, you no longer need to have Pap tests.  If you have had past treatment for cervical cancer or a condition that could lead to cancer, you need Pap tests and screening for cancer for at least 20 years after your treatment.  If you no longer get a Pap test, assess your risk factors if they change (such as having a new sexual partner). This can affect whether you should start being screened again.  Some women have medical problems that increase their chance of getting cervical cancer. If this is the case for you, your health care provider may recommend more frequent screening and Pap tests.  The human papillomavirus (HPV) test is another test that may be used for cervical cancer screening. The HPV test looks for the virus that can cause cell changes in the cervix. The cells collected during the Pap test can be tested for HPV.  The HPV test can be used to screen women 39 years of age and older. Getting tested for HPV can extend the interval between normal Pap tests from three to  five years.  An HPV test also should be used to screen women of any age who have unclear Pap test results.  After 41 years of age, women should have HPV testing as often as Pap tests.  Colorectal Cancer  This type of cancer can be detected and often prevented.  Routine colorectal cancer screening usually begins at 41 years of age and continues through 41 years of age.  Your health care provider may recommend screening at an earlier age if you have risk factors for colon cancer.  Your health care provider may also recommend using home test kits to check for hidden blood in the stool.  A small camera at the end of a tube can be used to examine your colon directly (sigmoidoscopy or colonoscopy). This is done to check for the earliest forms of colorectal cancer.  Routine screening usually begins at age 58.  Direct  examination of the colon should be repeated every 5-10 years through 42 years of age. However, you may need to be screened more often if early forms of precancerous polyps or small growths are found. Skin Cancer  Check your skin from head to toe regularly.  Tell your health care provider about any new moles or changes in moles, especially if there is a change in a mole's shape or color.  Also tell your health care provider if you have a mole that is larger than the size of a pencil eraser.  Always use sunscreen. Apply sunscreen liberally and repeatedly throughout the day.  Protect yourself by wearing long sleeves, pants, a wide-brimmed hat, and sunglasses whenever you are outside. HEART DISEASE, DIABETES, AND HIGH BLOOD PRESSURE   Have your blood pressure checked at least every 1-2 years. High blood pressure causes heart disease and increases the risk of stroke.  If you are between 26 years and 34 years old, ask your health care provider if you should take aspirin to prevent strokes.  Have regular diabetes screenings. This involves taking a blood sample to check your  fasting blood sugar level.  If you are at a normal weight and have a low risk for diabetes, have this test once every three years after 41 years of age.  If you are overweight and have a high risk for diabetes, consider being tested at a younger age or more often. PREVENTING INFECTION  Hepatitis B  If you have a higher risk for hepatitis B, you should be screened for this virus. You are considered at high risk for hepatitis B if:  You were born in a country where hepatitis B is common. Ask your health care provider which countries are considered high risk.  Your parents were born in a high-risk country, and you have not been immunized against hepatitis B (hepatitis B vaccine).  You have HIV or AIDS.  You use needles to inject street drugs.  You live with someone who has hepatitis B.  You have had sex with someone who has hepatitis B.  You get hemodialysis treatment.  You take certain medicines for conditions, including cancer, organ transplantation, and autoimmune conditions. Hepatitis C  Blood testing is recommended for:  Everyone born from [birth year] through 1966.  Anyone with known risk factors for hepatitis C. Sexually transmitted infections (STIs)  You should be screened for sexually transmitted infections (STIs) including gonorrhea and chlamydia if:  You are sexually active and are younger than 41 years of age.  You are older than 41 years of age and your health care provider tells you that you are at risk for this type of infection.  Your sexual activity has changed since you were last screened and you are at an increased risk for chlamydia or gonorrhea. Ask your health care provider if you are at risk.  If you do not have HIV, but are at risk, it may be recommended that you take a prescription medicine daily to prevent HIV infection. This is called pre-exposure prophylaxis (PrEP). You are considered at risk if:  You are sexually active and do not regularly use condoms or  know the HIV status of your partner(s).  You take drugs by injection.  You are sexually active with a partner who has HIV. Talk with your health care provider about whether you are at high risk of being infected with HIV. If you choose to begin PrEP, you should first be tested for HIV. You should then be tested  every 3 months for as long as you are taking PrEP.  PREGNANCY   If you are premenopausal and you may become pregnant, ask your health care provider about preconception counseling.  If you may become pregnant, take 400 to 800 micrograms (mcg) of folic acid every day.  If you want to prevent pregnancy, talk to your health care provider about birth control (contraception). OSTEOPOROSIS AND MENOPAUSE   Osteoporosis is a disease in which the bones lose minerals and strength with aging. This can result in serious bone fractures. Your risk for osteoporosis can be identified using a bone density scan.  If you are 67 years of age or older, or if you are at risk for osteoporosis and fractures, ask your health care provider if you should be screened.  Ask your health care provider whether you should take a calcium or vitamin D supplement to lower your risk for osteoporosis.  Menopause may have certain physical symptoms and risks.  Hormone replacement therapy may reduce some of these symptoms and risks. Talk to your health care provider about whether hormone replacement therapy is right for you.  HOME CARE INSTRUCTIONS   Schedule regular health, dental, and eye exams.  Stay current with your immunizations.   Do not use any tobacco products including cigarettes, chewing tobacco, or electronic cigarettes.  If you are pregnant, do not drink alcohol.  If you are breastfeeding, limit how much and how often you drink alcohol.  Limit alcohol intake to no more than 1 drink per day for nonpregnant women. One drink equals 12 ounces of beer, 5 ounces of wine, or 1 ounces of hard liquor.  Do  not use street drugs.  Do not share needles.  Ask your health care provider for help if you need support or information about quitting drugs.  Tell your health care provider if you often feel depressed.  Tell your health care provider if you have ever been abused or do not feel safe at home. Document Released: 11/23/2010 Document Revised: 09/24/2013 Document Reviewed: 04/11/2013 [facility] Patient Information 2015 [place], [place]. This information is not intended to replace advice given to you by your health care provider. Make sure you discuss any questions you have with your health care provider.

## 2015-08-11 NOTE — Progress Notes (Signed)
    Ana Rose Jan 05, 1976 [id number]        39 y.o.  G1P1001  for annual exam.  Overall doing well. Several issues noted below.  Past medical history,surgical history, problem list, medications, allergies, family history and social history were all reviewed and documented as reviewed in the EPIC chart.  ROS:  Performed with pertinent positives and negatives included in the history, assessment and plan.   Additional significant findings :  none   Exam: [name] assistant Filed Vitals:   08/11/15 0814  BP: 124/80  Height: 5\' 7"  (1.702 m)  Weight: 160 lb (72.576 kg)   General appearance:  Normal affect, orientation and appearance. Skin: Grossly normal HEENT: Without gross lesions.  No cervical or supraclavicular adenopathy. Thyroid normal.  Lungs:  Clear without wheezing, rales or rhonchi Cardiac: RR, without RMG Abdominal:  Soft, nontender, without masses, guarding, rebound, organomegaly or hernia Breasts:  Examined lying and sitting without masses, retractions, discharge or axillary adenopathy. Pelvic:  Ext/BUS/vagina normal  Adnexa without masses or tenderness    Anus and perineum normal   Rectovaginal normal sphincter tone without palpated masses or tenderness.    Assessment/Plan:  40 y.o. G18P1001 female for annual exam.   1. History of [place] 2012 with endometriosis noted.  Over the last several months or so notes intermittent pelvic cramping.  Feels like menstrual cramps. Doesn't persist. No regularity or timing. No nausea vomiting diarrhea constipation. No frequency dysuria urgency low back pain. Exam is normal.  Will start with ultrasound given history of endometriosis rule out endometriomas. Differential discussed the possible scenarios to include laparoscopy, GI referral for other workups. Follow up after ultrasound and then will triage based upon results. 2. Pap smear 07/2014. No Pap smear done today. History of cryosurgery age 19 with normal Pap smears  afterwards. 3. Mammography 11/2014. Continue with annual mammography when due. SBE monthly reviewed. 4. History hypercholesterolemia.  Cholesterol 297 last year with repeat to 80. Was instructed to follow up with the primary physician but never did so. She is fasting this morning and will go ahead and check a repeat lipid profile. Assuming it's elevated she does know the importance of following up with her primary physician for evaluation and possible treatment. 5. Health maintenance. Will check baseline CBC, CMP, urinalysis along with her lipid profile. Follow up in one year, sooner as needed.   [name] MD, 8:38 AM 08/11/2015

## 2015-08-14 ENCOUNTER — Other Ambulatory Visit: Payer: Self-pay | Admitting: Gynecology

## 2015-08-14 DIAGNOSIS — D72819 Decreased white blood cell count, unspecified: Secondary | ICD-10-CM

## 2015-08-14 NOTE — Progress Notes (Signed)
Left message to call.

## 2015-08-18 ENCOUNTER — Other Ambulatory Visit: Payer: Self-pay

## 2015-08-21 ENCOUNTER — Other Ambulatory Visit: Payer: Self-pay | Admitting: Gynecology

## 2015-08-21 ENCOUNTER — Ambulatory Visit (INDEPENDENT_AMBULATORY_CARE_PROVIDER_SITE_OTHER): Payer: BLUE CROSS/BLUE SHIELD | Admitting: Gynecology

## 2015-08-21 ENCOUNTER — Encounter: Payer: Self-pay | Admitting: Gynecology

## 2015-08-21 ENCOUNTER — Ambulatory Visit (INDEPENDENT_AMBULATORY_CARE_PROVIDER_SITE_OTHER): Payer: BLUE CROSS/BLUE SHIELD

## 2015-08-21 VITALS — BP 118/74

## 2015-08-21 DIAGNOSIS — D72819 Decreased white blood cell count, unspecified: Secondary | ICD-10-CM | POA: Diagnosis not present

## 2015-08-21 DIAGNOSIS — R102 Pelvic and perineal pain: Secondary | ICD-10-CM

## 2015-08-21 DIAGNOSIS — N949 Unspecified condition associated with female genital organs and menstrual cycle: Secondary | ICD-10-CM

## 2015-08-21 DIAGNOSIS — N8300 Follicular cyst of ovary, unspecified side: Secondary | ICD-10-CM | POA: Diagnosis not present

## 2015-08-21 LAB — CBC WITH DIFFERENTIAL/PLATELET
Basophils Absolute: 0 10*3/uL (ref 0.0–0.1)
Basophils Relative: 1 % (ref 0–1)
Eosinophils Absolute: 0 10*3/uL (ref 0.0–0.7)
Eosinophils Relative: 1 % (ref 0–5)
HCT: 37.5 % (ref 36.0–46.0)
Hemoglobin: 13 g/dL (ref 12.0–15.0)
Lymphocytes Relative: 36 % (ref 12–46)
Lymphs Abs: 1.5 10*3/uL (ref 0.7–4.0)
MCH: 33.1 pg (ref 26.0–34.0)
MCHC: 34.7 g/dL (ref 30.0–36.0)
MCV: 95.4 fL (ref 78.0–100.0)
MPV: 9.5 fL (ref 8.6–12.4)
Monocytes Absolute: 0.5 10*3/uL (ref 0.1–1.0)
Monocytes Relative: 12 % (ref 3–12)
Neutro Abs: 2.1 10*3/uL (ref 1.7–7.7)
Neutrophils Relative %: 50 % (ref 43–77)
Platelets: 294 10*3/uL (ref 150–400)
RBC: 3.93 MIL/uL (ref 3.87–5.11)
RDW: 14.2 % (ref 11.5–15.5)
WBC: 4.1 10*3/uL (ref 4.0–10.5)

## 2015-08-21 NOTE — Patient Instructions (Signed)
Try the stool softeners to see if more frequent bowel movements alleviate the cramping pain.  Office will contact you with the repeat blood work results.

## 2015-08-21 NOTE — Progress Notes (Signed)
    NASHARA PIERSOL Mar 18, 1976 [id number]        39 y.o.  G1P1001 Presents for ultrasound due to history of pelvic cramping over the past month or so. Status post TLH in the past with endometriosis. Also on routine blood work her white count was 2.8 with shifts in the neutrophil and lymphocyte percentages.  Past medical history,surgical history, problem list, medications, allergies, family history and social history were all reviewed and documented in the EPIC chart.  Directed ROS with pertinent positives and negatives documented in the history of present illness/assessment and plan.  Exam: Filed Vitals:   08/21/15 1504  BP: 118/74   General appearance:  Normal  Ultrasound status post hysterectomy. Right and left ovaries visualized and normal with small follicular development. No evidence of endometriomas or other pathology. Cul-de-sac negative.  Assessment/Plan:  40 y.o. G1P1001 with normal ultrasound. She relates having bowel movements every 3-4 days and on ultrasound was found to be full of stool. Recommend stool softeners such as FiberCon on a routine basis and see if more frequent stooling doesn't eliminate her pelvic discomfort. She also had a repeat CBC today    [name] MD, 3:18 PM 08/21/2015

## 2015-08-28 ENCOUNTER — Telehealth: Payer: Self-pay | Admitting: *Deleted

## 2015-08-28 DIAGNOSIS — Z1329 Encounter for screening for other suspected endocrine disorder: Secondary | ICD-10-CM

## 2015-08-28 NOTE — Telephone Encounter (Signed)
Pt asked if she could have TSH level drawn need this lab test to complete for her job. Please advise

## 2015-08-28 NOTE — Telephone Encounter (Signed)
Pt informed with the below note, order placed. Pt coming on 09/03/15 for lab

## 2015-08-28 NOTE — Telephone Encounter (Signed)
Okay for TSH

## 2015-09-03 ENCOUNTER — Other Ambulatory Visit: Payer: BLUE CROSS/BLUE SHIELD

## 2015-09-08 ENCOUNTER — Telehealth: Payer: Self-pay

## 2015-09-08 NOTE — Telephone Encounter (Signed)
Patient informed. 

## 2015-09-08 NOTE — Telephone Encounter (Signed)
I had sent patient a message via My Chart e-mail and it was returned unread. I will call her.  Ana Rose,  Dr. Phineas Real wanted you to know that on recheck your Complete Blood Count was all normal.  Have a good day!  Darius Bump., RMA

## 2015-09-11 ENCOUNTER — Other Ambulatory Visit: Payer: BLUE CROSS/BLUE SHIELD

## 2015-09-11 DIAGNOSIS — Z1329 Encounter for screening for other suspected endocrine disorder: Secondary | ICD-10-CM

## 2015-09-11 LAB — TSH: TSH: 2.36 mIU/L

## 2015-11-10 ENCOUNTER — Encounter (HOSPITAL_BASED_OUTPATIENT_CLINIC_OR_DEPARTMENT_OTHER): Payer: Self-pay | Admitting: Emergency Medicine

## 2015-11-10 ENCOUNTER — Emergency Department (HOSPITAL_BASED_OUTPATIENT_CLINIC_OR_DEPARTMENT_OTHER): Payer: BLUE CROSS/BLUE SHIELD

## 2015-11-10 ENCOUNTER — Emergency Department (HOSPITAL_BASED_OUTPATIENT_CLINIC_OR_DEPARTMENT_OTHER)
Admission: EM | Admit: 2015-11-10 | Discharge: 2015-11-10 | Disposition: A | Discharge status: Home / Self Care | Payer: BLUE CROSS/BLUE SHIELD | Attending: Emergency Medicine | Admitting: Emergency Medicine

## 2015-11-10 DIAGNOSIS — Z79899 Other long term (current) drug therapy: Secondary | ICD-10-CM | POA: Insufficient documentation

## 2015-11-10 DIAGNOSIS — R42 Dizziness and giddiness: Secondary | ICD-10-CM | POA: Diagnosis not present

## 2015-11-10 DIAGNOSIS — M542 Cervicalgia: Secondary | ICD-10-CM | POA: Diagnosis not present

## 2015-11-10 DIAGNOSIS — R0602 Shortness of breath: Secondary | ICD-10-CM | POA: Insufficient documentation

## 2015-11-10 DIAGNOSIS — R0789 Other chest pain: Secondary | ICD-10-CM | POA: Insufficient documentation

## 2015-11-10 DIAGNOSIS — I509 Heart failure, unspecified: Secondary | ICD-10-CM | POA: Insufficient documentation

## 2015-11-10 DIAGNOSIS — R110 Nausea: Secondary | ICD-10-CM | POA: Insufficient documentation

## 2015-11-10 DIAGNOSIS — Z87891 Personal history of nicotine dependence: Secondary | ICD-10-CM | POA: Diagnosis not present

## 2015-11-10 DIAGNOSIS — F909 Attention-deficit hyperactivity disorder, unspecified type: Secondary | ICD-10-CM | POA: Insufficient documentation

## 2015-11-10 MED ORDER — ACETAMINOPHEN 500 MG PO TABS
1000.0000 mg | ORAL_TABLET | Freq: Once | ORAL | Status: AC
  Administered 2015-11-10: 1000 mg via ORAL
  Filled 2015-11-10: qty 2

## 2015-11-10 NOTE — ED Provider Notes (Signed)
CSN: [id number]     Arrival date & time 11/10/15  1733 History  By signing my name below, I, Rowan Blase, attest that this documentation has been prepared under the direction and in the presence of Leo Grosser, MD . Electronically Signed: Rowan Blase, Scribe. 11/10/2015. 7:09 PM.   Chief Complaint  Patient presents with  . Chest Pain    The history is provided by the patient. No language interpreter was used.   HPI Comments:  GLENA DEHAY is a 40 y.o. female with PMhx of GERD, CHF, and postpartum cardiomyopathy who presents to the Emergency Department complaining of "squeezing" left-side chest pain, intermittent over the past 2 weeks. Pain occasionally radiates to left arm and currently radiates to neck. Pt originally thought pain was due to anxiety due to increased stress recently. She reports today the pain was constant and she felt sick all day; pt reports feeling like she was going to pass out with associated nausea this afternoon. Pt also notes difficulty taking a deep breath secondary to pain. No alleviating factors noted. Denies h/o smoking.  Past Medical History  Diagnosis Date  . GERD (gastroesophageal reflux disease)   . Hiatal hernia   . CHF (congestive heart failure) ([place]) 2001    post partum cardiomyopathy - resolved  . Cardiomyopathy     POST PARTUM  . Heart murmur     funtional  . Epileptic seizures ([place]) 05/2011  . ADD (attention deficit disorder)    Past Surgical History  Procedure Laterality Date  . Gastric bypass  2010  . Tubal ligation  2001  . Cholecystectomy  2001  . Laparoscopic total hysterectomy  7.16.2012    endometriosis on fallopian tubes  . Gynecologic cryosurgery  Approximately age 7   Family History  Problem Relation Age of Onset  . Hypertension Mother   . Hypertension Father   . Diabetes Paternal Grandmother   . Cancer Paternal Grandmother     OV/UT  . Breast cancer Paternal Grandmother 49  . Breast cancer Cousin 34     MATERNAL  . Alcoholism Paternal Grandfather    Social History  Substance Use Topics  . Smoking status: Former Smoker    Types: Cigarettes    Quit date: 11/30/1998  . Smokeless tobacco: Never Used  . Alcohol Use: No   OB History    Gravida Para Term Preterm AB TAB SAB Ectopic Multiple Living   1 1 1       1      Review of Systems  Respiratory: Positive for shortness of breath.   Cardiovascular: Positive for chest pain.  Gastrointestinal: Positive for nausea.  Musculoskeletal: Positive for neck pain.  Neurological: Positive for light-headedness.  All other systems reviewed and are negative.   Allergies  Hydrocodone; Nsaids; Vicodin; and Xanax  Home Medications   Prior to Admission medications   Medication Sig Start Date End Date Taking? Authorizing Provider  ondansetron (ZOFRAN) 4 MG tablet Take 4 mg by mouth every 8 (eight) hours as needed for nausea or vomiting.   Yes Historical Provider, MD  Carbamazepine (EQUETRO) 300 MG CP12 Take by mouth 2 (two) times daily.    Historical Provider, MD  ferrous sulfate 325 (65 FE) MG tablet Take 1 tablet (325 mg total) by mouth daily with breakfast. 08/28/12   Patrecia Pour, NP  gabapentin (NEURONTIN) 300 MG capsule Take 1 capsule (300 mg total) by mouth daily. 08/28/12   Patrecia Pour, NP  lamoTRIgine (LAMICTAL) 200 MG tablet Take  1 tablet (200 mg total) by mouth every morning. 08/28/12   Patrecia Pour, NP  lisdexamfetamine (VYVANSE) 50 MG capsule Take 1 capsule (50 mg total) by mouth every morning. 08/28/12   Patrecia Pour, NP  Multiple Vitamin (MULTIVITAMIN) tablet Take 1 tablet by mouth daily. 08/28/12   Patrecia Pour, NP  pantoprazole (PROTONIX) 40 MG tablet Take 1 tablet (40 mg total) by mouth at bedtime. 08/28/12   Patrecia Pour, NP  vitamin B-12 (CYANOCOBALAMIN) 1000 MCG tablet Take 5 tablets (5,000 mcg total) by mouth daily. 08/28/12   Patrecia Pour, NP   BP 153/92 mmHg  Pulse 103  Temp(Src) 98 F (36.7 C) (Oral)  Resp 18  Ht 5\' 6"   (1.676 m)  Wt 160 lb (72.576 kg)  BMI 25.84 kg/m2  SpO2 98%  LMP 11/18/2010   Physical Exam  Constitutional: She is oriented to person, place, and time. She appears well-developed and well-nourished. No distress.  HENT:  Head: Normocephalic.  Eyes: Conjunctivae are normal.  Neck: Neck supple. No tracheal deviation present.  Cardiovascular: Normal rate, regular rhythm, S1 normal, S2 normal and normal heart sounds.  Exam reveals no gallop and no friction rub.   No murmur heard. Pulmonary/Chest: Effort normal and breath sounds normal. No respiratory distress. She exhibits tenderness.  Clear to auscultation BL; palpable tenderness to left anterior chest  Abdominal: Soft. She exhibits no distension.  Musculoskeletal:       Left shoulder: Normal.  Neurological: She is alert and oriented to person, place, and time.  Skin: Skin is warm and dry.  Psychiatric: She has a normal mood and affect.    ED Course  Procedures  DIAGNOSTIC STUDIES:  Oxygen Saturation is 98% on RA, normal by my interpretation.    COORDINATION OF CARE:  6:58 PM Will order chest x-ray and administer Tylenol. Discussed treatment plan with pt at bedside and pt agreed to plan.  Labs Review Labs Reviewed - No data to display  Imaging Review Dg Chest 2 View  11/10/2015  CLINICAL DATA:  Left chest pain intermittent for the last several weeks. Shortness of breath. EXAM: CHEST  2 VIEW COMPARISON:  02/18/2015 FINDINGS: Minimal thoracic spondylosis. The lungs appear clear. Cardiac and mediastinal contours normal. No pleural effusion identified. IMPRESSION: 1. No significant thoracic abnormality identified. Electronically Signed   By: Van Clines M.D.   On: 11/10/2015 19:27   I have personally reviewed and evaluated these images and lab results as part of my medical decision-making.   EKG Interpretation   Date/Time:  Monday November 10 2015 17:44:16 EDT Ventricular Rate:  96 PR Interval:  132 QRS Duration: 94 QT  Interval:  366 QTC Calculation: 462 R Axis:   81 Text Interpretation:  Normal sinus rhythm Normal ECG No significant change  since last tracing Confirmed by Josefina Rynders MD, Primo Innis [id number]) on 11/10/2015  6:10:48 PM      MDM   Final diagnoses:  Chest wall pain    40 y.o. female presents with tenderness of left anterior chest wall. Highly atypical for ACS. PERC negative. No EKG changes. Appears MSK in nature. States cannot take NSAIDs because of gastric bypass, recommended tylenol and early mobility.   I personally performed the services described in this documentation, which was scribed in my presence. The recorded information has been reviewed and is accurate.    Leo Grosser, MD 11/11/15 [phone]

## 2015-11-10 NOTE — ED Notes (Signed)
Chest pain with   sob

## 2015-11-10 NOTE — Discharge Instructions (Signed)

## 2015-11-10 NOTE — ED Notes (Signed)
Pt made aware to return if symptoms worsen or if any life threatening symptoms occur.   

## 2015-11-10 NOTE — ED Notes (Signed)
Visitor stated pt was dry heaving, asked doctor for nausea medication

## 2015-12-10 ENCOUNTER — Encounter: Payer: Self-pay | Admitting: Cardiovascular Disease

## 2015-12-29 ENCOUNTER — Ambulatory Visit (INDEPENDENT_AMBULATORY_CARE_PROVIDER_SITE_OTHER): Payer: BLUE CROSS/BLUE SHIELD | Admitting: Cardiovascular Disease

## 2015-12-29 ENCOUNTER — Encounter: Payer: Self-pay | Admitting: Cardiovascular Disease

## 2015-12-29 VITALS — BP 154/102 | HR 101 | Ht 66.0 in | Wt 159.1 lb

## 2015-12-29 DIAGNOSIS — Z7189 Other specified counseling: Secondary | ICD-10-CM | POA: Diagnosis not present

## 2015-12-29 DIAGNOSIS — Z7689 Persons encountering health services in other specified circumstances: Secondary | ICD-10-CM

## 2015-12-29 DIAGNOSIS — I429 Cardiomyopathy, unspecified: Secondary | ICD-10-CM | POA: Diagnosis not present

## 2015-12-29 DIAGNOSIS — R079 Chest pain, unspecified: Secondary | ICD-10-CM | POA: Diagnosis not present

## 2015-12-29 NOTE — Patient Instructions (Signed)
Medication Instructions:  Your physician recommends that you continue on your current medications as directed. Please refer to the Current Medication list given to you today.   Labwork: None ordered  Testing/Procedures: Your physician has requested that you have a stress echocardiogram. For further information please visit [email or web address]. Please follow instruction sheet as given.   Follow-Up: Your physician recommends that you schedule a follow-up appointment as needed   Any Other Special Instructions Will Be Listed Below (If Applicable).     If you need a refill on your cardiac medications before your next appointment, please call your pharmacy.

## 2015-12-29 NOTE — Progress Notes (Signed)
Cardiology Office Note   Date:  12/29/2015   ID:  Ana Rose, DOB Sep 04, 1975, MRN [id number]  PCP:  Aretta Nip, MD  Cardiologist:   Jenkins Rouge, MD   Chief Complaint  Patient presents with  . Establish Care    Chest wall pain, comes & goes, per pt      History of Present Illness: Ana Rose is a 40 y.o. female who presents for evaluation of chest pain.   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 Pain occasionally radiates to left arm and currently radiates to neck. Pt originally thought pain was due to anxiety due to increased stress recently. The pain was constant and she felt sick all day; pt reports feeling like she was going to pass out with associated nausea this afternoon. Pt also notes difficulty taking a deep breath secondary to pain. No alleviating factors noted. Denies h/o smoking.  R/O CXR NAD ECG normal thought to be MSK Rx with Tylenol no NSAI due to gastric Bypass   Since ER has had some recurrent pains. Can last a day worse with inspiration Relief with tylenol. Been on Vyvanse for years. Some stress at work does not Seem to like working at [employer].   Has 40 yo at [employer]  LDL 103 HDL 140  TRG 116  Past Medical History:  Diagnosis Date  . ADD (attention deficit disorder)   . Cardiomyopathy    POST PARTUM  . CHF (congestive heart failure) ([place]) 2001   post partum cardiomyopathy - resolved  . Epileptic seizures ([place]) 05/2011  . GERD (gastroesophageal reflux disease)   . Heart murmur    funtional  . Hiatal hernia     Past Surgical History:  Procedure Laterality  Date  . CHOLECYSTECTOMY  2001  . GASTRIC BYPASS  2010  . GYNECOLOGIC CRYOSURGERY  Approximately age 44  . LAPAROSCOPIC TOTAL HYSTERECTOMY  7.16.2012   endometriosis on fallopian tubes  . TUBAL LIGATION  2001     Current Outpatient Prescriptions  Medication Sig Dispense Refill  . Carbamazepine (EQUETRO) 300 MG CP12 Take by mouth 2 (two) times daily.    . ferrous sulfate 325 (65 FE) MG tablet Take 1 tablet (325 mg total) by mouth daily with breakfast. 30 tablet 0  . gabapentin (NEURONTIN) 300 MG capsule Take 1 capsule (300 mg total) by mouth daily. 30 capsule 0  . lamoTRIgine (LAMICTAL) 200 MG tablet Take 1 tablet (200 mg total) by mouth every morning. 30 tablet 0  . lisdexamfetamine (VYVANSE) 50 MG capsule Take 1 capsule (50 mg total) by mouth every morning. 1 capsule 0  . Multiple Vitamin (MULTIVITAMIN) tablet Take 1 tablet by mouth daily. 30 tablet 0  . ondansetron (ZOFRAN) 4 MG tablet Take 4 mg by mouth every 8 (eight) hours as needed for nausea or vomiting.    . pantoprazole (PROTONIX) 40 MG tablet Take 1 tablet (40 mg total) by mouth at bedtime. 30 tablet 0  . vitamin B-12 (CYANOCOBALAMIN) 1000 MCG tablet Take 5 tablets (5,000 mcg total) by mouth daily. 150 tablet 0   No current facility-administered medications for  this visit.     Allergies:   Hydrocodone; Nsaids; Vicodin [hydrocodone-acetaminophen]; and Xanax [alprazolam]    Social History:  The patient  reports that she quit smoking about 17 years ago. Her smoking use included Cigarettes. She has never used smokeless tobacco. She reports that she does not drink alcohol or use drugs.   Family History:  The patient's family history includes Alcoholism in her paternal grandfather; Breast cancer (age of onset: 50) in her cousin; Breast cancer (age of onset: 98) in her paternal grandmother; Cancer in her paternal grandmother; Diabetes in her paternal grandmother; Hypertension in her father and mother.    ROS:  Please see the  history of present illness.   Otherwise, review of systems are positive for none.   All other systems are reviewed and negative.    PHYSICAL EXAM: VS:  BP (!) 154/102 (BP Location: Right Arm, Cuff Size: Normal)   Pulse (!) 101   Ht 5\' 6"  (1.676 m)   Wt 159 lb 1.9 oz (72.2 kg)   LMP 11/18/2010   SpO2 98%   BMI 25.68 kg/m  , BMI Body mass index is 25.68 kg/m. Affect appropriate Healthy:  appears stated age 39: normal Neck supple with no adenopathy JVP normal no bruits no thyromegaly Lungs clear with no wheezing and good diaphragmatic motion Heart:  S1/S2 no murmur, no rub, gallop or click PMI normal Abdomen: benighn, BS positve, no tenderness, no AAA no bruit.  No HSM or HJR Distal pulses intact with no bruits No edema Neuro non-focal Skin warm and dry No muscular weakness    EKG:   11/12/15 SR rate 91  Normal  12/29/15 SR rate 95 nonspecific ST changes    Recent Labs: 08/11/2015: ALT 20; BUN 7; Creat 0.69; Potassium 4.1; Sodium 138 08/21/2015: Hemoglobin 13.0; Platelets 294 09/11/2015: TSH 2.36    Lipid Panel    Component Value Date/Time   CHOL 266 (H) 08/11/2015 0836   TRIG 116 08/11/2015 0836   HDL 140 08/11/2015 0836   CHOLHDL 1.9 08/11/2015 0836   VLDL 23 08/11/2015 0836   LDLCALC 103 08/11/2015 0836      Wt Readings from Last 3 Encounters:  12/29/15 159 lb 1.9 oz (72.2 kg)  11/10/15 160 lb (72.6 kg)  08/11/15 160 lb (72.6 kg)      Other studies Reviewed: Additional studies/ records that were reviewed today include: ER notes CXR labs and ECG .    ASSESSMENT AND PLAN:  1.  Chest pain: Atypical recurrent requiring ER visit nonspecific ST changes With history of DCM postpartum F/U stress echo 2. HTN: white coat encouraged her to f/u with Dr Zadie Rhine and get BP cuff To check at home 3. ADD: has been on vyvanse for years consider changing to non stimulant Drug 4. Hiatal Hernia post bariatric surgery continue protonix    Current medicines are  reviewed at length with the patient today.  The patient does not have concerns regarding medicines.  The following changes have been made:  no change  Labs/ tests ordered today include: Ex Stress echo   Orders Placed This Encounter  Procedures  . EKG 12-Lead  . ECHOCARDIOGRAM STRESS TEST     Disposition:   FU with me PRN     Signed, Jenkins Rouge, MD  12/29/2015 3:39 PM    [facility] [place], [place], [place]  [address] Phone: [phone]; Fax: [phone]

## 2016-01-05 ENCOUNTER — Encounter: Payer: Self-pay | Admitting: Gynecology

## 2016-01-06 ENCOUNTER — Other Ambulatory Visit: Payer: Self-pay

## 2016-01-06 DIAGNOSIS — R0789 Other chest pain: Secondary | ICD-10-CM

## 2016-01-07 ENCOUNTER — Telehealth: Payer: Self-pay

## 2016-01-07 NOTE — Telephone Encounter (Signed)
Left message for patient to call back. Wanted to make sure she had instructions for her GXT.

## 2016-01-12 ENCOUNTER — Telehealth: Payer: Self-pay | Admitting: Cardiovascular Disease

## 2016-01-12 NOTE — Telephone Encounter (Signed)
Spoke with Ana Rose regarding why insurance denied the Stress Echo and that Dr. Johnsie Cancel has ordered a GXT. She didn't want to schedule the GX,  now due to she is calling the  insurance company to discuss why she was denied the Stress Echo.  She stated that she has several cardiac issues that they don't know about.  Once she speak with them, she will call and let me know what she is going to do next.

## 2016-01-15 ENCOUNTER — Other Ambulatory Visit (HOSPITAL_COMMUNITY): Payer: Self-pay

## 2016-02-26 ENCOUNTER — Ambulatory Visit (INDEPENDENT_AMBULATORY_CARE_PROVIDER_SITE_OTHER): Payer: BLUE CROSS/BLUE SHIELD

## 2016-02-26 DIAGNOSIS — R0789 Other chest pain: Secondary | ICD-10-CM | POA: Diagnosis not present

## 2016-02-26 LAB — EXERCISE TOLERANCE TEST
Estimated workload: 10.1 METS
Exercise duration (min): 9 min
Exercise duration (sec): 0 s
MPHR: 179 {beats}/min
Peak HR: 171 {beats}/min
Percent HR: 95 %
RPE: 16
Rest HR: 103 {beats}/min

## 2016-08-12 ENCOUNTER — Encounter: Payer: BLUE CROSS/BLUE SHIELD | Admitting: Gynecology

## 2016-08-12 DIAGNOSIS — Z0289 Encounter for other administrative examinations: Secondary | ICD-10-CM

## 2016-08-31 ENCOUNTER — Ambulatory Visit (INDEPENDENT_AMBULATORY_CARE_PROVIDER_SITE_OTHER): Payer: BLUE CROSS/BLUE SHIELD | Admitting: Gynecology

## 2016-08-31 ENCOUNTER — Encounter: Payer: Self-pay | Admitting: Gynecology

## 2016-08-31 VITALS — BP 134/84 | Ht 66.0 in | Wt 153.0 lb

## 2016-08-31 DIAGNOSIS — E7800 Pure hypercholesterolemia, unspecified: Secondary | ICD-10-CM

## 2016-08-31 DIAGNOSIS — Z01419 Encounter for gynecological examination (general) (routine) without abnormal findings: Secondary | ICD-10-CM | POA: Diagnosis not present

## 2016-08-31 HISTORY — DX: Pure hypercholesterolemia, unspecified: E78.00

## 2016-08-31 NOTE — Patient Instructions (Signed)
Follow up for your fasting blood work.

## 2016-08-31 NOTE — Progress Notes (Signed)
    Ana Rose [birth year] [id number]        40 y.o.  G1P1001 for annual exam.    Past medical history,surgical history, problem list, medications, allergies, family history and social history were all reviewed and documented as reviewed in the EPIC chart.  ROS:  Performed with pertinent positives and negatives included in the history, assessment and plan.   Additional significant findings :  None   Exam: [name] assistant Vitals:   08/31/16 1450  BP: 134/84  Weight: 153 lb (69.4 kg)  Height: 5\' 6"  (1.676 m)   Body mass index is 24.69 kg/m.  General appearance:  Normal affect, orientation and appearance. Skin: Grossly normal HEENT: Without gross lesions.  No cervical or supraclavicular adenopathy. Thyroid normal.  Lungs:  Clear without wheezing, rales or rhonchi Cardiac: RR, without RMG Abdominal:  Soft, nontender, without masses, guarding, rebound, organomegaly or hernia Breasts:  Examined lying and sitting without masses, retractions, discharge or axillary adenopathy. Pelvic:  Ext, BUS, Vagina: Normal  Adnexa: Without masses or tenderness    Anus and perineum: Normal   Rectovaginal: Normal sphincter tone without palpated masses or tenderness.    Assessment/Plan:  41 y.o. G31P1001 female for annual exam, history of [place] 2012 for endometriosis.Marland Kitchen   1. Pap smear 07/2014. No Pap smear done today. History of cryosurgery age 81. Normal Pap smears since. Options to stop screening altogether per current screening guidelines and history of hysterectomy discussed. Will readdress on annual basis. 2. Mammography 11/2015. Continue with annual mammography when due. SBE monthly reviewed. 3. History of hypercholesterolemia.  Showed her values to her primary physician historically and they were okay with her elevated cholesterol of 266, LDL 103 and HDL of 140. Recheck fasting lipid profile now she is going to return for that. 4. Health maintenance. Baseline CBC, CMP, lipid profile  ordered. TSH normal last year. Follow up for her fasting blood work otherwise follow up in one year for annual exam.   [name] MD, 3:09 PM 08/31/2016

## 2016-09-01 ENCOUNTER — Emergency Department (HOSPITAL_COMMUNITY)
Admission: EM | Admit: 2016-09-01 | Discharge: 2016-09-01 | Disposition: A | Discharge status: Home / Self Care | Payer: BLUE CROSS/BLUE SHIELD | Attending: Emergency Medicine | Admitting: Emergency Medicine

## 2016-09-01 ENCOUNTER — Emergency Department (HOSPITAL_COMMUNITY): Payer: BLUE CROSS/BLUE SHIELD

## 2016-09-01 DIAGNOSIS — Z87891 Personal history of nicotine dependence: Secondary | ICD-10-CM | POA: Insufficient documentation

## 2016-09-01 DIAGNOSIS — Y999 Unspecified external cause status: Secondary | ICD-10-CM | POA: Diagnosis not present

## 2016-09-01 DIAGNOSIS — Z23 Encounter for immunization: Secondary | ICD-10-CM | POA: Insufficient documentation

## 2016-09-01 DIAGNOSIS — S0101XA Laceration without foreign body of scalp, initial encounter: Secondary | ICD-10-CM | POA: Diagnosis not present

## 2016-09-01 DIAGNOSIS — Y92009 Unspecified place in unspecified non-institutional (private) residence as the place of occurrence of the external cause: Secondary | ICD-10-CM | POA: Diagnosis not present

## 2016-09-01 DIAGNOSIS — Y9389 Activity, other specified: Secondary | ICD-10-CM | POA: Diagnosis not present

## 2016-09-01 DIAGNOSIS — F909 Attention-deficit hyperactivity disorder, unspecified type: Secondary | ICD-10-CM | POA: Diagnosis not present

## 2016-09-01 DIAGNOSIS — Z79899 Other long term (current) drug therapy: Secondary | ICD-10-CM | POA: Diagnosis not present

## 2016-09-01 DIAGNOSIS — I509 Heart failure, unspecified: Secondary | ICD-10-CM | POA: Insufficient documentation

## 2016-09-01 DIAGNOSIS — S0083XA Contusion of other part of head, initial encounter: Secondary | ICD-10-CM

## 2016-09-01 DIAGNOSIS — S0181XA Laceration without foreign body of other part of head, initial encounter: Secondary | ICD-10-CM | POA: Diagnosis not present

## 2016-09-01 DIAGNOSIS — S0993XA Unspecified injury of face, initial encounter: Secondary | ICD-10-CM | POA: Diagnosis present

## 2016-09-01 DIAGNOSIS — W0110XA Fall on same level from slipping, tripping and stumbling with subsequent striking against unspecified object, initial encounter: Secondary | ICD-10-CM | POA: Insufficient documentation

## 2016-09-01 DIAGNOSIS — W19XXXA Unspecified fall, initial encounter: Secondary | ICD-10-CM

## 2016-09-01 MED ORDER — OXYCODONE-ACETAMINOPHEN 5-325 MG PO TABS: 1.0000 | ORAL_TABLET | ORAL | 0 refills | Status: DC | PRN

## 2016-09-01 MED ORDER — LIDOCAINE-EPINEPHRINE (PF) 2 %-1:200000 IJ SOLN
10.0000 mL | Freq: Once | INTRAMUSCULAR | Status: AC
  Administered 2016-09-01: 10 mL
  Filled 2016-09-01: qty 20

## 2016-09-01 MED ORDER — SODIUM CHLORIDE 0.9 % IV BOLUS (SEPSIS)
1000.0000 mL | Freq: Once | INTRAVENOUS | Status: AC
  Administered 2016-09-01: 1000 mL via INTRAVENOUS

## 2016-09-01 MED ORDER — TETANUS-DIPHTH-ACELL PERTUSSIS 5-2.5-18.5 LF-MCG/0.5 IM SUSP
0.5000 mL | Freq: Once | INTRAMUSCULAR | Status: AC
  Administered 2016-09-01: 0.5 mL via INTRAMUSCULAR
  Filled 2016-09-01: qty 0.5

## 2016-09-01 MED ORDER — CEPHALEXIN 500 MG PO CAPS: 500.0000 mg | ORAL_CAPSULE | Freq: Four times a day (QID) | ORAL | 0 refills | Status: DC

## 2016-09-01 NOTE — ED Provider Notes (Signed)
[facility] DEPT Provider Note   CSN: [id number] Arrival date & time: 09/01/16  0130     History   Chief Complaint Chief Complaint  Patient presents with  . Fall    HPI Ana Rose is a 41 y.o. female.  Patient presents with facial lacerations and head injury after mechanical fall at home. She tripped over her cat and fell forward hitting her head on the rim of the bath tub. No LOC. She denies nausea and vomiting. No other injury. She denies visual changes. Wife at bedside reports no altered mental status or confusion.   The history is provided by the patient. No language interpreter was used.  Fall  Pertinent negatives include no chest pain, no abdominal pain and no shortness of breath.    Past Medical History:  Diagnosis Date  . ADD (attention deficit disorder)   . Cardiomyopathy    POST PARTUM  . CHF (congestive heart failure) ([place]) 2001   post partum cardiomyopathy - resolved  . Epileptic seizures ([place]) 05/2011  . GERD (gastroesophageal reflux disease)   . Heart murmur    funtional  . Hiatal hernia     Patient Active Problem List   Diagnosis Date Noted  . Bipolar disorder, unspecified 08/25/2012  . Alcohol dependence ([place]) 08/25/2012  . Epileptic seizures ([place])   . ADD (attention deficit disorder)     Past Surgical History:  Procedure Laterality Date  . CHOLECYSTECTOMY  2001  . GASTRIC BYPASS  2010  . GYNECOLOGIC CRYOSURGERY  Approximately age 68  . LAPAROSCOPIC TOTAL HYSTERECTOMY  7.16.2012   endometriosis on fallopian tubes  . TUBAL LIGATION  2001    OB History    Gravida Para Term Preterm AB Living   1 1 1     1    SAB TAB Ectopic Multiple Live Births                   Home Medications    Prior to Admission medications   Medication Sig Start Date End Date Taking? Authorizing Provider  carbamazepine (EQUETRO) 200 MG CP12 12 hr capsule Take 200-600 mg by mouth See admin instructions. Take 1 tablet every morning and take 3 tablets every  evening   Yes Historical Provider, MD  gabapentin (NEURONTIN) 300 MG capsule Take 1 capsule (300 mg total) by mouth daily. Patient taking differently: Take 300-600 mg by mouth every 8 (eight) hours as needed (for anxiety).  08/28/12  Yes Patrecia Pour, NP  LamoTRIgine (LAMICTAL ODT) 100 MG TBDP Take 300 mg by mouth daily.   Yes Historical Provider, MD  lisdexamfetamine (VYVANSE) 50 MG capsule Take 1 capsule (50 mg total) by mouth every morning. 08/28/12  Yes Patrecia Pour, NP  Melatonin 3 MG TABS Take 3 mg by mouth at bedtime.   Yes Historical Provider, MD  omega-3 acid ethyl esters (LOVAZA) 1 g capsule Take 1 g by mouth daily.   Yes Historical Provider, MD  ondansetron (ZOFRAN) 4 MG tablet Take 4 mg by mouth daily.    Yes Historical Provider, MD  Vitamin D, Ergocalciferol, (DRISDOL) 50000 units CAPS capsule Take 50,000 Units by mouth every 7 (seven) days.   Yes Historical Provider, MD  ferrous sulfate 325 (65 FE) MG tablet Take 1 tablet (325 mg total) by mouth daily with breakfast. Patient not taking: Reported on 09/01/2016 08/28/12   Patrecia Pour, NP  lamoTRIgine (LAMICTAL) 200 MG tablet Take 1 tablet (200 mg total) by mouth every morning. Patient not  taking: Reported on 09/01/2016 08/28/12   Patrecia Pour, NP  Multiple Vitamin (MULTIVITAMIN) tablet Take 1 tablet by mouth daily. Patient not taking: Reported on 09/01/2016 08/28/12   Patrecia Pour, NP  pantoprazole (PROTONIX) 40 MG tablet Take 1 tablet (40 mg total) by mouth at bedtime. Patient not taking: Reported on 09/01/2016 08/28/12   Patrecia Pour, NP  vitamin B-12 (CYANOCOBALAMIN) 1000 MCG tablet Take 5 tablets (5,000 mcg total) by mouth daily. Patient not taking: Reported on 09/01/2016 08/28/12   Patrecia Pour, NP    Family History Family History  Problem Relation Age of Onset  . Hypertension Mother   . Hypertension Father   . Diabetes Paternal Grandmother   . Cancer Paternal Grandmother     OV/UT  . Breast cancer Paternal Grandmother 87    . Breast cancer Cousin 34    MATERNAL  . Alcoholism Paternal Grandfather     Social History Social History  Substance Use Topics  . Smoking status: Former Smoker    Types: Cigarettes    Quit date: 11/30/1998  . Smokeless tobacco: Never Used  . Alcohol use Not on file     Comment: Rare     Allergies   Hydrocodone; Nsaids; Vicodin [hydrocodone-acetaminophen]; and Xanax [alprazolam]   Review of Systems Review of Systems  Constitutional: Negative for diaphoresis.  HENT: Negative for nosebleeds.   Respiratory: Negative for shortness of breath.   Cardiovascular: Negative for chest pain.  Gastrointestinal: Negative for abdominal pain and nausea.  Musculoskeletal: Negative for back pain and neck pain.  Skin: Positive for wound.  Neurological: Negative for weakness.     Physical Exam Updated Vital Signs BP 121/76   Pulse 99   Temp 97.4 F (36.3 C) (Axillary)   Resp 15   Ht 5\' 6"  (1.676 m)   LMP 11/18/2010   SpO2 98%   BMI 24.69 kg/m   Physical Exam  Constitutional: She is oriented to person, place, and time. She appears well-developed and well-nourished. No distress.  HENT:  Head: Normocephalic.  Nose: Nose normal.  Eyes: Conjunctivae are normal. Pupils are equal, round, and reactive to light.  Neck: Normal range of motion. Neck supple.  Cardiovascular: Normal rate and regular rhythm.   Pulmonary/Chest: Effort normal and breath sounds normal. She exhibits no tenderness.  Abdominal: Soft. Bowel sounds are normal. There is no tenderness. There is no rebound and no guarding.  Musculoskeletal: Normal range of motion.  No midline cervical tenderness. FROM all extremities.   Neurological: She is alert and oriented to person, place, and time. No sensory deficit. Coordination normal.  Skin: Skin is warm and dry. No rash noted.  There is a 3 cm laceration above right eye inferior to eye brow.There is also a small, pair of lacerations right temporal scalp with arterial  bleed. These lacerations are less than 1 cm in length and are parallel.  Psychiatric: She has a normal mood and affect.     ED Treatments / Results  Labs (all labs ordered are listed, but only abnormal results are displayed) Labs Reviewed - No data to display  EKG  EKG Interpretation None       Radiology No results found.  Procedures Procedures (including critical care time) LACERATION REPAIR Performed by: Charlann Lange A Authorized by: Charlann Lange A Consent: Verbal consent obtained. Risks and benefits: risks, benefits and alternatives were discussed Consent given by: patient Patient identity confirmed: provided demographic data Prepped and Draped in normal sterile fashion Wound explored  Laceration Location: right temporal scalp  Laceration Length: less than 1 cm, x 2  No Foreign Bodies seen or palpated  Anesthesia: local infiltration  Local anesthetic: lidocaine 2% w/epinephrine  Anesthetic total: 1 ml  Irrigation method: syringe Amount of cleaning: standard  Skin closure: 6-0 vicryl  Number of sutures: There were multiple cross sutures placed to tie off arterial bleeding with success.   Technique: cross suture  Patient tolerance: Patient tolerated the procedure well with no immediate complications.   LACERATION REPAIR Performed by: Charlann Lange A Authorized by: Charlann Lange A Consent: Verbal consent obtained. Risks and benefits: risks, benefits and alternatives were discussed Consent given by: patient Patient identity confirmed: provided demographic data Prepped and Draped in normal sterile fashion Wound explored  Laceration Location: right eye brow  Laceration Length: 3cm  No Foreign Bodies seen or palpated  Anesthesia: local infiltration  Local anesthetic: lidocaine 2% w/epinephrine  Anesthetic total: 2 ml  Irrigation method: syringe Amount of cleaning: standard  Skin closure: 6-0 prolene  Number of sutures:  6  Technique: simple interrupted.   Patient tolerance: Patient tolerated the procedure well with no immediate complications.  Medications Ordered in ED Medications  lidocaine-EPINEPHrine (XYLOCAINE W/EPI) 2 %-1:200000 (PF) injection 10 mL (not administered)  Tdap (BOOSTRIX) injection 0.5 mL (not administered)     Initial Impression / Assessment and Plan / ED Course  I have reviewed the triage vital signs and the nursing notes.  Pertinent labs & imaging results that were available during my care of the patient were reviewed by me and considered in my medical decision making (see chart for details).     Patient here for evaluation of facial injury after mechanical fall at home. She admits to drinking alcohol. No. LOC, vomiting, AMS. Lacerations to face repaired as per above note.   She is observed over time. CT head and neck are unremarkable. She is ambulated and becomes significantly dizzy, bringing her to sit on the floor. Blood pressure drops to 90's and IVF's provided.  The patient continues to state she wants to go home. Wife at bedside reports she is comfortable taking her home. Blood pressure improves with fluids. Patient will be discharged home in the care of spouse.   Final Clinical Impressions(s) / ED Diagnoses   Final diagnoses:  None   1. Fall 2. Facial lacerations  New Prescriptions New Prescriptions   No medications on file     Charlann Lange, Hershal Coria 09/10/16 2150    Merryl Hacker, MD 09/27/16 [phone]

## 2016-09-01 NOTE — ED Notes (Addendum)
After discharge materials reviewed pt was ambulated to bathroom. Pt reported dizziness that was improving at that time. Pt went to bathroom and got up to wash her hands and states that she started to feel dizzy and weak so she leaned against the wall and sat down on the ground. Pt denies falling, LOC or any injury at this time. Pt escorted back to room, vital signs taken WNL, Information relayed to [facility] who ordered 1L NS bolus and observation for any further symptoms. Will continue to monitor.

## 2016-09-01 NOTE — ED Notes (Signed)
ED Provider at bedside. 

## 2016-09-01 NOTE — ED Notes (Signed)
Patient transported to CT 

## 2016-09-01 NOTE — ED Triage Notes (Signed)
Per EMS, pt from home after tripping over her cat and hitting her head on the edge of the bathtub. Denies LOC due to the fall. Pt has one laceration above the right eyebrow and one laceration below the right eyebrow. Bleeding controlled at this time. EMS vitals: BP-144/104, P-86, RR-16, CBG-95

## 2016-12-09 ENCOUNTER — Encounter: Payer: Self-pay | Admitting: Gynecology

## 2017-05-27 HISTORY — PX: COLONOSCOPY: SHX174

## 2017-09-01 ENCOUNTER — Encounter: Payer: BLUE CROSS/BLUE SHIELD | Admitting: Gynecology

## 2018-03-09 ENCOUNTER — Other Ambulatory Visit: Payer: Self-pay | Admitting: Family Medicine

## 2018-03-09 ENCOUNTER — Encounter: Payer: Self-pay | Admitting: Radiology

## 2018-03-09 ENCOUNTER — Ambulatory Visit
Admission: RE | Admit: 2018-03-09 | Discharge: 2018-03-09 | Disposition: A | Discharge status: Home / Self Care | Payer: BLUE CROSS/BLUE SHIELD | Source: Ambulatory Visit | Attending: Family Medicine | Admitting: Family Medicine

## 2018-03-09 DIAGNOSIS — R1032 Left lower quadrant pain: Secondary | ICD-10-CM

## 2018-03-09 MED ORDER — IOPAMIDOL (ISOVUE-300) INJECTION 61%
100.0000 mL | Freq: Once | INTRAVENOUS | Status: AC | PRN
  Administered 2018-03-09: 100 mL via INTRAVENOUS

## 2018-03-27 ENCOUNTER — Ambulatory Visit: Payer: BLUE CROSS/BLUE SHIELD | Admitting: Gynecology

## 2018-03-27 ENCOUNTER — Encounter: Payer: Self-pay | Admitting: Gynecology

## 2018-03-27 VITALS — BP 124/80

## 2018-03-27 DIAGNOSIS — N951 Menopausal and female climacteric states: Secondary | ICD-10-CM | POA: Diagnosis not present

## 2018-03-27 DIAGNOSIS — R1032 Left lower quadrant pain: Secondary | ICD-10-CM | POA: Diagnosis not present

## 2018-03-27 NOTE — Patient Instructions (Signed)
Follow up for ultrasound as scheduled 

## 2018-03-27 NOTE — Progress Notes (Signed)
    Ana Rose [birth year] [id number]        42 y.o.  G1P1001 presents with left lower quadrant cramping pain since May.  Comes and goes sometimes radiating to the right lower quadrant.  Feels like uterine cramping.  History of TLH in the past with findings of endometriosis on the fallopian tubes, fulgurated.  Recent CT scan for evaluation of this pain showed right ovary not identified.  Left ovary somewhat enlarged measuring 6 x 3.6 x 3.8 cm containing 3 cm cyst.  No pelvic adenopathy or ascites.  No nausea vomiting diarrhea constipation.  No UTI symptoms such as frequency dysuria urgency.  Past medical history,surgical history, problem list, medications, allergies, family history and social history were all reviewed and documented in the EPIC chart.  Directed ROS with pertinent positives and negatives documented in the history of present illness/assessment and plan.  Exam: [name] assistant Vitals:   03/27/18 1149  BP: 124/80   General appearance:  Normal Spine straight without CVA tenderness Abdomen soft nontender without masses guarding rebound Pelvic external BUS vagina normal.  Bimanual without gross masses or tenderness.  Rectal exam is normal  Assessment/Plan:  42 y.o. G1P1001 with persistent left lower quadrant pain with some radiation to the right.  History of endometriosis at hysterectomy specimen in the past.  CT scan shows 3 cm left ovarian cyst.  Recommend following up for ultrasound now for better definition of the cyst as well as identification of the right ovary.  Check baseline urine analysis also.  Various scenarios reviewed to include observation, laparoscopic surgery with cystectomy up to and including BSO.  We will also check baseline FSH TSH now as patient is having symptoms to suggest perimenopausal changes such as weight loss hair changes and skin changes.    [name] MD, 1:00 PM 03/27/2018

## 2018-03-28 ENCOUNTER — Encounter: Payer: Self-pay | Admitting: Gynecology

## 2018-03-28 LAB — URINALYSIS, COMPLETE W/RFL CULTURE
Bacteria, UA: NONE SEEN /HPF
Bilirubin Urine: NEGATIVE
Glucose, UA: NEGATIVE
Hgb urine dipstick: NEGATIVE
Hyaline Cast: NONE SEEN /LPF
Ketones, ur: NEGATIVE
Leukocyte Esterase: NEGATIVE
Nitrites, Initial: NEGATIVE
Protein, ur: NEGATIVE
RBC / HPF: NONE SEEN /HPF (ref 0–2)
Specific Gravity, Urine: 1.004 (ref 1.001–1.03)
WBC, UA: NONE SEEN /HPF (ref 0–5)
pH: 6.5 (ref 5.0–8.0)

## 2018-03-28 LAB — NO CULTURE INDICATED

## 2018-03-28 LAB — TSH: TSH: 2.42 mIU/L

## 2018-03-28 LAB — FOLLICLE STIMULATING HORMONE: FSH: 9.3 m[IU]/mL

## 2018-04-19 ENCOUNTER — Encounter: Payer: Self-pay | Admitting: Gynecology

## 2018-04-19 ENCOUNTER — Ambulatory Visit: Payer: BLUE CROSS/BLUE SHIELD | Admitting: Gynecology

## 2018-04-19 ENCOUNTER — Ambulatory Visit (INDEPENDENT_AMBULATORY_CARE_PROVIDER_SITE_OTHER): Payer: BLUE CROSS/BLUE SHIELD

## 2018-04-19 VITALS — BP 122/78

## 2018-04-19 DIAGNOSIS — R102 Pelvic and perineal pain: Secondary | ICD-10-CM | POA: Diagnosis not present

## 2018-04-19 DIAGNOSIS — R1032 Left lower quadrant pain: Secondary | ICD-10-CM

## 2018-04-19 DIAGNOSIS — N83202 Unspecified ovarian cyst, left side: Secondary | ICD-10-CM

## 2018-04-19 NOTE — Patient Instructions (Signed)
Office will call you to arrange surgery. 

## 2018-04-19 NOTE — Progress Notes (Signed)
    Ana Rose [birth year] [id number]        42 y.o.  G1P1001 presents for ultrasound due to left-sided pelvic pain.  Patient notes onset earlier this year and has persisted since then.  Comes and goes cramping to sharp stabbing.  Feels like her period is about ready to start.  Also has consistent left-sided pain with penetration.  History of TLH in the past with findings of endometriosis on the fallopian tubes which were fulgurated.  Ovaries and pelvis otherwise appeared normal.  Recent CT scan showed right ovary not identified and left ovary somewhat enlarged measuring 6 x 3.6 x 3.8 cm containing a 3 cm cyst.  No adenopathy or ascites.  She has no GI symptoms such as nausea vomiting diarrhea constipation.  No UTI symptoms with recent urine analysis negative.  Past medical history,surgical history, problem list, medications, allergies, family history and social history were all reviewed and documented in the EPIC chart.  Directed ROS with pertinent positives and negatives documented in the history of present illness/assessment and plan.  Exam: Vitals:   04/19/18 0937  BP: 122/78   General appearance:  Normal  Ultrasound transvaginal status post hysterectomy shows vaginal cuff normal.  Right ovary normal.  Left ovary with 2 thin-walled cystic areas with internal low-level echoes and negative color flow Doppler.  Cyst 1 measures 15 x 16 mm.  Cyst 2 measures 14 x 12 mm.  Cul-de-sac negative.  Assessment/Plan:  42 y.o. G1P1001 with persistent left sided pelvic pain feeling like her menses is ready to start and persistent dyspareunia on the left.  History of TLH with findings of endometriosis on her fallopian tubes.  Ultrasound with 2 small cystic areas.  We discussed differential between physiologic changes of the ovary versus small endometriomas.  Options for management reviewed to include expectant with follow-up ultrasound in several months versus proceeding with laparoscopy now.  Diagnostic  laparoscopy with addressing endometriosis as encountered and left-sided ovarian cystectomy versus proceeding with LSO regardless of findings up to and including BSO were discussed.  At age 42 the issues of BSO with a set of hypoestrogenic symptoms as well as accelerated cardiovascular and bone risk reviewed.  The issue of final pathology on LSO showing no endometriosis was also discussed.  The patient has thought about this already and she wants to proceed with laparoscopic LSO.  She would like to retain her right ovary.  She understands she may persist with her pelvic pain or have recurrence in the future ultimately requiring RSO.  We will go ahead and move towards scheduling the surgery and she will follow-up for preoperative visit.    [name] MD, 10:27 AM 04/19/2018

## 2018-04-24 ENCOUNTER — Telehealth: Payer: Self-pay

## 2018-04-24 NOTE — Telephone Encounter (Signed)
Patient called me back and would like to schedule for Jan 13th 7:30am at [facility].  We discussed her ins benefits and her estimated surgery prepymt due by one week prior to surgery.  I will mail her a financial letter as well as a pamphlet from [facility].  She will expect a call back from Butch Penny to schedule her pre op appt with Dr. Loetta Rough.

## 2018-04-24 NOTE — Telephone Encounter (Signed)
I called and left message for patient to call me about scheduling surgery.

## 2018-05-09 ENCOUNTER — Telehealth: Payer: Self-pay

## 2018-05-09 NOTE — Telephone Encounter (Addendum)
I called patient and left message for her to call me "about your insurance". I called her [employer] ins. plan to see if PA required for her upcoming surgery and I was told her plan terminated on 04/22/18.

## 2018-05-10 NOTE — Telephone Encounter (Signed)
Patient called. She is aware her insurance term'd on 04/22/18 but forgot to notify us.  She said she has been added to her wife's plan and gave me the ID # for the [facility] plan. However, when I went online to check her name is not on that plan.  I called her back to let her know. She said that her ins on that plan is not effective until 05/24/18.  I will call as soon as back in the office to check on benefits and her revised surgery prepymt amount.

## 2018-05-18 ENCOUNTER — Encounter (HOSPITAL_BASED_OUTPATIENT_CLINIC_OR_DEPARTMENT_OTHER): Payer: Self-pay

## 2018-05-19 ENCOUNTER — Encounter (HOSPITAL_BASED_OUTPATIENT_CLINIC_OR_DEPARTMENT_OTHER): Payer: Self-pay

## 2018-05-19 ENCOUNTER — Other Ambulatory Visit: Payer: Self-pay

## 2018-05-19 NOTE — Progress Notes (Signed)
Spoke with:  Leeanna NPO:  After Midnight, no gum, candy, or mints   Arrival time:  0530AM Labs: (CBC 05/30/2017 in epic) AM medications: Carbamazepine, Lamotrigine, Levothyroxine, Ondanestron, Gabapentin if needed Pre op orders: YES Ride home:  Juliann Pulse (wife) [phone]

## 2018-05-25 ENCOUNTER — Telehealth: Payer: Self-pay

## 2018-05-25 NOTE — Telephone Encounter (Signed)
I called patient and per DPR access note left detailed message and let her know I confirmed benefits for her new [employer] plan that became effective 05/24/18.  I advised her regarding surgery prepymt amount due and that she could pay that at her 05/30/18 pre op appt if she wanted.  Financial letter to be mailed by regular mail today.

## 2018-05-30 ENCOUNTER — Encounter: Payer: Self-pay | Admitting: Gynecology

## 2018-05-30 ENCOUNTER — Encounter (HOSPITAL_COMMUNITY)
Admission: RE | Admit: 2018-05-30 | Discharge: 2018-05-30 | Disposition: A | Discharge status: Home / Self Care | Payer: BLUE CROSS/BLUE SHIELD | Source: Ambulatory Visit | Attending: Gynecology | Admitting: Gynecology

## 2018-05-30 ENCOUNTER — Encounter (HOSPITAL_COMMUNITY): Payer: Self-pay | Admitting: Physician Assistant

## 2018-05-30 ENCOUNTER — Ambulatory Visit: Payer: BLUE CROSS/BLUE SHIELD | Admitting: Gynecology

## 2018-05-30 VITALS — BP 138/88

## 2018-05-30 DIAGNOSIS — Z01812 Encounter for preprocedural laboratory examination: Secondary | ICD-10-CM | POA: Diagnosis present

## 2018-05-30 DIAGNOSIS — R1032 Left lower quadrant pain: Secondary | ICD-10-CM | POA: Diagnosis not present

## 2018-05-30 DIAGNOSIS — N83202 Unspecified ovarian cyst, left side: Secondary | ICD-10-CM

## 2018-05-30 LAB — CBC
HCT: 33.2 % — ABNORMAL LOW (ref 36.0–46.0)
Hemoglobin: 10.2 g/dL — ABNORMAL LOW (ref 12.0–15.0)
MCH: 32.5 pg (ref 26.0–34.0)
MCHC: 30.7 g/dL (ref 30.0–36.0)
MCV: 105.7 fL — ABNORMAL HIGH (ref 80.0–100.0)
Platelets: 273 10*3/uL (ref 150–400)
RBC: 3.14 MIL/uL — ABNORMAL LOW (ref 3.87–5.11)
RDW: 14 % (ref 11.5–15.5)
WBC: 4.4 10*3/uL (ref 4.0–10.5)
nRBC: 0 % (ref 0.0–0.2)

## 2018-05-30 NOTE — Patient Instructions (Signed)
Followup for surgery as scheduled. 

## 2018-05-30 NOTE — Progress Notes (Signed)
    Ana Rose [birth year] [id number]        43 y.o.  G1P1001 presents for her preoperative consult for upcoming laparoscopic LSO right salpingectomy.  She has a history of persistent left lower quadrant cramping pain feeling like uterine cramps.  History of TLH in the past with findings of endometriosis on the fallopian tubes which were fulgurated.  Recent CT scan for pain showed right ovary not identified.  Left ovary somewhat enlarged measuring 6 x 3.6 x 3.8 cm with 3 cm cyst.  No pelvic adenopathy or ascites.  Follow-up ultrasound showed right ovary normal.  Left ovary with 2 thin-walled cystic areas with internal low-level echoes and negative color flow Doppler.  Cyst 1 measures 15 x 16 mm.  Cyst 2 measures 14 x 12 mm.  Cul-de-sac negative.  Options for management reviewed to include expectant versus diagnostic laparoscopy versus USO versus BSO all reviewed.  Patient elects for LSO.  We will plan on removing right fallopian tube for risk reductive surgery.  Past medical history,surgical history, problem list, medications, allergies, family history and social history were all reviewed and documented in the EPIC chart.  Directed ROS with pertinent positives and negatives documented in the history of present illness/assessment and plan.  Exam: [name] assistant Vitals:   05/30/18 1615  BP: 138/88   General appearance:  Normal HEENT normal Lungs clear Cardiac regular rate no rubs murmurs or gallops Abdomen soft nontender without masses guarding rebound Pelvic external BUS vagina normal.  Bimanual without masses or tenderness  Assessment/Plan:  43 y.o. G1P1001 with history of persistent left sided pain.  Ultrasound suggestive of 2 small endometriomas.  Discussed various possibilities to include normal ovary with no pathology.  Given the persistence of the left-sided pain and a history of endometriosis on prior surgery she prefers to proceed with LSO.  Risks of persistent pain following  the procedure was discussed.  Risk of leaving her right ovary and developing recurrent pain/problems with the right ovary in the future requiring surgery also discussed up to and including ovarian cancer.  Risks of removing both ovaries and hypoestrogenism requiring hormone replacement was also discussed.  The patient strongly wants to keep her right ovary but she does give me permission to remove her right ovary if it is my best intraoperative recommendation and she would consider hormone replacement following this.  The expected intraoperative and postoperative courses as well as the recovery period were reviewed. The risks of infection, prolonged antibiotics, reoperation for abscess or hematoma formation was discussed. The risks of hemorrhage necessitating transfusion and the risks of transfusion reaction, hepatitis, HIV, mad cow disease and other unknown entities was also discussed. Incisional complications to include opening and draining of incisions and closure by secondary intention, dehiscence and long-term issues of keloid/cosmetics and hernia formation were reviewed. The risk of inadvertent injury to internal organs including bowel, bladder, ureters, vessels, nerves either immediately recognized or delay recognized necessitating major exploratory reparative surgeries and future reparative surgeries including bowel resection, ostomy formation, bladder repair, ureteral damage repair was discussed with her.  She understands the risk of inadvertent injury is increased in patients with prior surgeries and potential for scarring and deviating normal anatomy.  The patient's questions were answered to her satisfaction and she is ready to proceed with surgery.    [name] MD, 4:50 PM 05/30/2018

## 2018-05-30 NOTE — H&P (Signed)
Ana Rose 23-Nov-1975 [id number]   History and Physical  Chief complaint: Left lower quadrant pain, left ovarian cysts, history of endometriosis  History of present illness: 43 y.o. G1P1001 with history of persistent left lower quadrant cramping pain feeling like uterine cramps.  History of TLH in the past with findings of endometriosis on the fallopian tubes which were fulgurated.  Recent CT scan for pain showed right ovary not identified.  Left ovary somewhat enlarged measuring 6 x 3.6 x 3.8 cm with 3 cm cyst.  No pelvic adenopathy or ascites.  Follow-up ultrasound showed right ovary normal.  Left ovary with 2 thin-walled cystic areas with internal low-level echoes and negative color flow Doppler.  Cyst 1 measures 15 x 16 mm.  Cyst 2 measures 14 x 12 mm.  Cul-de-sac negative.  Options for management reviewed to include expectant versus diagnostic laparoscopy versus USO versus BSO all reviewed.  Patient elects for LSO.  We will plan on removing right fallopian tube for risk reductive surgery.   Past Medical History:  Diagnosis Date  . ADD (attention deficit disorder)   . Alcohol dependence ([place])   . Anemia   . Anxiety   . Bipolar disorder ([place])   . Cardiomyopathy    POST PARTUM-resolved  . CHF (congestive heart failure) ([place]) 2001   post partum cardiomyopathy - resolved  . CIN I (cervical intraepithelial neoplasia I)   . Depression   . Epileptic seizures ([place]) 05/2011  . Fatty liver   . GERD (gastroesophageal reflux disease)   . Heart murmur    funtional  . Hiatal hernia   . History of dysfunctional uterine bleeding   . History of endometriosis   . History of gallstones   . History of rib fracture    Anterior left 7th  . History of stomach ulcers   . Hypercholesteremia 08/31/2016   Noted in note  . Hypothyroidism   . Irritable bowel syndrome (IBS)   . Ovarian cyst    Left  . Pneumonia    post partum  . PONV (postoperative nausea and vomiting)   . Pregnancy induced  hypertension   . Sebaceous cyst 06/03/2015   Back  . Suicide attempt ([employer]) 08/24/2012  . Urine incontinence   . Vitamin B 12 deficiency   . Wears glasses     Past Surgical History:  Procedure Laterality Date  . CHOLECYSTECTOMY  2001  . COLONOSCOPY  05/27/2017  . ESOPHAGOGASTRODUODENOSCOPY  05/2012  . GASTRIC BYPASS  2010  . GYNECOLOGIC CRYOSURGERY  Approximately age 89  . LAPAROSCOPIC TOTAL HYSTERECTOMY  7.16.2012   endometriosis on fallopian tubes  . TUBAL LIGATION  2001  . WISDOM TOOTH EXTRACTION      Family History  Problem Relation Age of Onset  . Hypertension Mother   . Hypertension Father   . Diabetes Paternal Grandmother   . Cancer Paternal Grandmother        OV/UT  . Breast cancer Paternal Grandmother 16  . Breast cancer Cousin 34       MATERNAL  . Alcoholism Paternal Grandfather     Social History:  reports that she quit smoking about 19 years ago. Her smoking use included cigarettes. She has never used smokeless tobacco. She reports current alcohol use. She reports that she does not use drugs.  Allergies:  Allergies  Allergen Reactions  . Hydrocodone Nausea And Vomiting  . Lactose Intolerance ([facility]) Nausea And Vomiting  . Nsaids Other (See Comments)    Oral NSAIDS contraindicated due  to Gastric Bypass  . Vicodin [Hydrocodone-Acetaminophen] Nausea And Vomiting  . Xanax [Alprazolam] Other (See Comments)    Over sedation    Medications: See Epic for most current list  ROS:  Was performed and pertinent positives and negatives are included in the history of present illness.  Exam:  [name] assistant Vitals:   05/30/18 1615  BP: 138/88   General appearance:  Normal HEENT normal Lungs clear Cardiac regular rate no rubs murmurs or gallops Abdomen soft nontender without masses guarding rebound Pelvic external BUS vagina normal.  Bimanual without masses or tenderness    Assessment/Plan:  43 y.o. G1P1001 with history of persistent left sided  pain.  Ultrasound suggestive of 2 small endometriomas.  Discussed various possibilities to include normal ovary with no pathology.  Given the persistence of the left-sided pain and a history of endometriosis on prior surgery she prefers to proceed with LSO.  Risks of persistent pain following the procedure was discussed.  Risk of leaving her right ovary and developing recurrent pain/problems with the right ovary in the future requiring surgery also discussed up to and including ovarian cancer.  Risks of removing both ovaries and hypoestrogenism requiring hormone replacement was also discussed.  The patient strongly wants to keep her right ovary but she does give me permission to remove her right ovary if it is my best intraoperative recommendation and she would consider hormone replacement following this.  The expected intraoperative and postoperative courses as well as the recovery period were reviewed. The risks of infection, prolonged antibiotics, reoperation for abscess or hematoma formation was discussed. The risks of hemorrhage necessitating transfusion and the risks of transfusion reaction, hepatitis, HIV, mad cow disease and other unknown entities was also discussed. Incisional complications to include opening and draining of incisions and closure by secondary intention, dehiscence and long-term issues of keloid/cosmetics and hernia formation were reviewed. The risk of inadvertent injury to internal organs including bowel, bladder, ureters, vessels, nerves either immediately recognized or delay recognized necessitating major exploratory reparative surgeries and future reparative surgeries including bowel resection, ostomy formation, bladder repair, ureteral damage repair was discussed with her.  She understands the risk of inadvertent injury is increased in patients with prior surgeries and potential for scarring and deviating normal anatomy.  The patient's questions were answered to her satisfaction and she  is ready to proceed with surgery.    [name] MD, 4:58 PM 05/30/2018

## 2018-05-31 ENCOUNTER — Telehealth: Payer: Self-pay

## 2018-05-31 NOTE — Telephone Encounter (Signed)
Called in voice mail stating she needed note for wife to be out of work for a week with her surgery.  I left message for her to call me back. Asked her to make sure no FMLA form needed and that letter will suffice. Also, to clarify dates.

## 2018-05-31 NOTE — Telephone Encounter (Signed)
I spoke with patient's wife, per DPR and stated that the surgery is to be rescheduled due to their financial situation with change of their insurance.  Dr Phineas Real stated the surgery is not an emergent case as there is no known etiology for the case to be performed.  We are aware of the patient's pain and history of endometriosis although diagnostic testing doesn't show any presence of etiology.  Juliann Pulse is in understanding of this. We will contact the patient to reschedule for a future date. KW CMA

## 2018-05-31 NOTE — Telephone Encounter (Signed)
Case was cancelled due to financial issues.  I will contact patient in the next few weeks to see if she will be ready to re-schedule for March.

## 2018-06-01 ENCOUNTER — Telehealth: Payer: Self-pay

## 2018-06-01 NOTE — Telephone Encounter (Signed)
Patient called in voice mail acknowledging that her wife cancelled her surgery yesterday due to financial reasons.  She said they are still working on it and trying to figure it out and asked if we could hold on until tomorrow cancelling.  I called her back and got her voice mail. Per DPR access note on file I left detailed message and told her that per my manager, after discussion with her wife, that the surgery already cancelled. The reason we ask for surgery prepymt to be pd by week before surgery is to be able to free up the OR time to be used by someone else and the doctors schedules if patient cannot have surgery.  I explained that the other cases were moved up and the office schedules have been filled. I told her I was very sorry that she was not able to have surgery Monday but as soon as they see that they are ready to schedule to give me a call.  I told her that I will be getting the March calendar in the next few weeks.

## 2018-06-05 ENCOUNTER — Ambulatory Visit (HOSPITAL_BASED_OUTPATIENT_CLINIC_OR_DEPARTMENT_OTHER): Admission: RE | Admit: 2018-06-05 | Payer: BLUE CROSS/BLUE SHIELD | Source: Home / Self Care | Admitting: Gynecology

## 2018-06-05 HISTORY — DX: Personal history of other diseases of the digestive system: Z87.19

## 2018-06-05 HISTORY — DX: Unspecified urinary incontinence: R32

## 2018-06-05 HISTORY — DX: Fatty (change of) liver, not elsewhere classified: K76.0

## 2018-06-05 HISTORY — DX: Presence of spectacles and contact lenses: Z97.3

## 2018-06-05 HISTORY — DX: Anxiety disorder, unspecified: F41.9

## 2018-06-05 HISTORY — DX: Deficiency of other specified B group vitamins: E53.8

## 2018-06-05 HISTORY — DX: Alcohol dependence, uncomplicated: F10.20

## 2018-06-05 HISTORY — DX: Hypothyroidism, unspecified: E03.9

## 2018-06-05 HISTORY — DX: Pure hypercholesterolemia, unspecified: E78.00

## 2018-06-05 HISTORY — DX: Anemia, unspecified: D64.9

## 2018-06-05 HISTORY — DX: Gestational (pregnancy-induced) hypertension without significant proteinuria, unspecified trimester: O13.9

## 2018-06-05 HISTORY — DX: Personal history of (healed) traumatic fracture: Z87.81

## 2018-06-05 HISTORY — DX: Bipolar disorder, unspecified: F31.9

## 2018-06-05 HISTORY — DX: Pneumonia, unspecified organism: J18.9

## 2018-06-05 HISTORY — DX: Sebaceous cyst: L72.3

## 2018-06-05 HISTORY — DX: Depression, unspecified: F32.A

## 2018-06-05 HISTORY — DX: Unspecified ovarian cyst, unspecified side: N83.209

## 2018-06-05 HISTORY — DX: Major depressive disorder, single episode, unspecified: F32.9

## 2018-06-05 HISTORY — DX: Irritable bowel syndrome, unspecified: K58.9

## 2018-06-05 HISTORY — DX: Mild cervical dysplasia: N87.0

## 2018-06-05 HISTORY — DX: Suicide attempt, initial encounter: T14.91XA

## 2018-06-05 HISTORY — DX: Personal history of other diseases of the female genital tract: Z87.42

## 2018-06-05 SURGERY — SALPINGO-OOPHORECTOMY, UNILATERAL, LAPAROSCOPIC
Anesthesia: General | Laterality: Right

## 2018-07-11 ENCOUNTER — Telehealth: Payer: Self-pay

## 2018-07-11 NOTE — Telephone Encounter (Signed)
Patient called. Confirmed she would like the available 08/15/18 date. I will mail her a [facility] pamphlet and a financial letter.  Surgery scheduled.

## 2018-07-11 NOTE — Telephone Encounter (Signed)
Patient called stating she is ready to schedule surgery and has now met deductible. I called her and per DPR access left a message and let her know I checked ins benefits and confirmed deductible is met.  I left her the estimated surgery prepaymt amount and the available date I have in March. I asked her to consider and call me when she can.

## 2018-07-12 ENCOUNTER — Telehealth: Payer: Self-pay

## 2018-07-12 NOTE — Telephone Encounter (Signed)
I called patient and per DPR access note on file I left a message that Dr. Loetta Rough does what to see her again for a pre op visit since it will have been >4 weeks since he last saw her.  I asked her to call me to schedule.

## 2018-07-13 ENCOUNTER — Encounter: Payer: Self-pay | Admitting: Gynecology

## 2018-07-13 NOTE — Telephone Encounter (Signed)
Pre op appt scheduled for 08/08/18 at 11:00am.

## 2018-08-01 ENCOUNTER — Ambulatory Visit (INDEPENDENT_AMBULATORY_CARE_PROVIDER_SITE_OTHER): Payer: BLUE CROSS/BLUE SHIELD | Admitting: Plastic Surgery

## 2018-08-01 ENCOUNTER — Encounter: Payer: Self-pay | Admitting: Plastic Surgery

## 2018-08-01 DIAGNOSIS — L723 Sebaceous cyst: Secondary | ICD-10-CM

## 2018-08-01 MED ORDER — SCOPOLAMINE 1 MG/3DAYS TD PT72: 1.0000 | MEDICATED_PATCH | TRANSDERMAL | 0 refills | Status: AC

## 2018-08-01 NOTE — Progress Notes (Signed)
   Subjective:    Patient ID: Ana Rose, female    DOB: July 27, 1975, 43 y.o.   MRN: [id number]  The patient is a 43 year old female here for evaluation of a lesion on her right inner thigh.  Patient states that it swells up, is very tender and then drains.  Right now it is tender and has a bruised appearance to it.  She is not aware of any trauma to the area.  She does also mention that she bruises more easily now than previously.  She has always bruised easily so far she can remember.  The bruised area is 3 cm in size.  It has the appearance of a sebaceous cyst.  She seems to have a little bit of a tremor which may be related to her medications.  She is also going on a trip next month and has requested a scopolamine patch.   Review of Systems  Constitutional: Negative.   HENT: Negative.   Eyes: Negative.   Respiratory: Negative.   Cardiovascular: Negative.   Gastrointestinal: Negative.  Negative for abdominal pain.  Endocrine: Negative.   Genitourinary: Negative.   Musculoskeletal: Negative.   Hematological: Negative.   Psychiatric/Behavioral: Negative.        Objective:   Physical Exam Vitals signs and nursing note reviewed.  HENT:     Head: Normocephalic.     Nose: Nose normal.     Mouth/Throat:     Mouth: Mucous membranes are moist.  Eyes:     Extraocular Movements: Extraocular movements intact.  Cardiovascular:     Rate and Rhythm: Normal rate.  Pulmonary:     Effort: Pulmonary effort is normal.  Abdominal:     General: Abdomen is flat. There is no distension.     Tenderness: There is no abdominal tenderness.  Musculoskeletal:       Legs:  Skin:    General: Skin is warm.  Neurological:     Mental Status: She is alert.  Psychiatric:        Mood and Affect: Mood normal.        Thought Content: Thought content normal.        Judgment: Judgment normal.        Assessment & Plan:  Sebaceous cyst Recommend excision of right inner thigh sebaceous cyst.  Due to  the size we can do it in the outpatient surgery center.  If the patient feels strongly about doing in the clinic that is an option.  I think she will be more comfortable in the OR. Scopolamine patch prescribed.

## 2018-08-08 ENCOUNTER — Ambulatory Visit: Payer: BLUE CROSS/BLUE SHIELD | Admitting: Gynecology

## 2018-08-08 ENCOUNTER — Telehealth: Payer: Self-pay | Admitting: *Deleted

## 2018-08-08 NOTE — Telephone Encounter (Signed)
Pt cancelled her preop apt this morning as her flight was delayed. The patient was traveling from [place] that has had higher numbers of COVID + tests.  With potential exposure and no symptoms, Dr. Phineas Real does feel that it is best to postpone her surgery as it is not emergent and not place her under anesthesia in the event that symptoms could present up to 14 days after potential exposure.  The patient does work from home, and discussed with her reducing risk of infection. Pt understood and agreed that we would reschedule her surgery for a later date.  I will contact the patient in 2 weeks to reschedule. KW CMA

## 2018-08-08 NOTE — Telephone Encounter (Signed)
I did also go over Symptoms and to call her PCP if symptoms present or if receives a letter of being exposed to a positive COVID from [place].  Pt understood that as well. KW CMA

## 2018-08-15 ENCOUNTER — Encounter (HOSPITAL_BASED_OUTPATIENT_CLINIC_OR_DEPARTMENT_OTHER): Admission: RE | Payer: Self-pay | Source: Home / Self Care

## 2018-08-15 ENCOUNTER — Ambulatory Visit (HOSPITAL_BASED_OUTPATIENT_CLINIC_OR_DEPARTMENT_OTHER): Admission: RE | Admit: 2018-08-15 | Payer: BLUE CROSS/BLUE SHIELD | Source: Home / Self Care | Admitting: Gynecology

## 2018-08-15 SURGERY — SALPINGO-OOPHORECTOMY, UNILATERAL, LAPAROSCOPIC
Anesthesia: General | Laterality: Right

## 2018-08-30 ENCOUNTER — Encounter: Payer: Self-pay | Admitting: *Deleted

## 2018-08-31 ENCOUNTER — Telehealth: Payer: Self-pay | Admitting: *Deleted

## 2018-08-31 NOTE — Telephone Encounter (Signed)
The patient called inquiring about rescheduling her surgery.  I left a message for patient stating that we have a date of rescheduling patients after June 1st.  And that we will contact her back in a few weeks to get this set up. I did advise I sent her a letter through [place]. And to call back if any other questions. KW CMA

## 2018-09-13 ENCOUNTER — Encounter: Payer: Self-pay | Admitting: Plastic Surgery

## 2018-09-13 ENCOUNTER — Ambulatory Visit (INDEPENDENT_AMBULATORY_CARE_PROVIDER_SITE_OTHER): Payer: BLUE CROSS/BLUE SHIELD | Admitting: Plastic Surgery

## 2018-09-13 ENCOUNTER — Other Ambulatory Visit: Payer: Self-pay

## 2018-09-13 DIAGNOSIS — S0992XA Unspecified injury of nose, initial encounter: Secondary | ICD-10-CM | POA: Diagnosis not present

## 2018-09-13 NOTE — Progress Notes (Signed)
   Subjective:    Patient ID: Ana Rose, female    DOB: 1976-02-12, 43 y.o.   MRN: [id number]  The patient is a 43 yrs old wf here for evaluation of her nose.  Several days ago she fell while chasing her cat outside.  She may have passed out.  She is not sure.  She was evaluated by her PCP via telehealth.  She had pain, swelling and a bloody nose.  She now notices that she is getting stuffy on the right side of her nose.  There have not been any further nose bleeds.  There are no bone step offs.  There does not appear to be any lacerations in the nasal airway.  The swelling has imp(roved.  There are not external lacerations.  No external asymmetry noted.   Review of Systems  Constitutional: Negative for activity change and appetite change.  HENT: Positive for facial swelling. Negative for dental problem, nosebleeds and voice change.   Eyes: Negative.   Respiratory: Negative for shortness of breath.   Cardiovascular: Negative.   Gastrointestinal: Negative.   Genitourinary: Negative.   Musculoskeletal: Negative.        Objective:   Physical Exam Vitals signs and nursing note reviewed.  Constitutional:      Appearance: Normal appearance.  HENT:     Head: Normocephalic.     Nose: Nose normal.     Mouth/Throat:     Mouth: Mucous membranes are moist.  Eyes:     Extraocular Movements: Extraocular movements intact.     Pupils: Pupils are equal, round, and reactive to light.  Cardiovascular:     Rate and Rhythm: Normal rate.  Pulmonary:     Effort: Pulmonary effort is normal.  Neurological:     General: No focal deficit present.     Mental Status: She is alert. Mental status is at baseline.  Psychiatric:        Mood and Affect: Mood normal.        Behavior: Behavior normal.       Assessment & Plan:  Nasal trauma, initial encounter  Recommend nasonex and saline wash as needed. The nose does not appear to be fractured.  If the airway compromise continues then ENT visit would  be helpful.  Patient agrees.

## 2018-09-19 ENCOUNTER — Ambulatory Visit: Payer: Self-pay | Admitting: Plastic Surgery

## 2018-10-02 ENCOUNTER — Telehealth: Payer: Self-pay

## 2018-10-02 NOTE — Telephone Encounter (Signed)
I called patient to let her know we have resumed scheduling surgery. I offered her 10/30/2018 and she would like surgery that day. I scheduled her for 7:30am that morning at [facility].  We reviewed her ins and she has now met her ded and coins and has NO surgery prepymt due as ins is paying claims at 100%. I mailed her a [facility] pamphlet and CLaudia will call her to schedule a short pre op appt since it has been several mos since.

## 2018-10-13 ENCOUNTER — Telehealth: Payer: Self-pay | Admitting: Plastic Surgery

## 2018-10-13 NOTE — Telephone Encounter (Signed)
Called patient to confirm appointment scheduled for Tuesday 10/17/2018. Patient answered the following questions: 1. To the best of your knowledge, have you been in close contact with any one with a confirmed diagnosis of COVID-19? No 2. Have you had any one or more of the following; fever, chills, cough, shortness of breath, or any flu-like symptoms? No 3. Have you been diagnosed with or have a previous diagnosis of COVID 19? No 4. I am going to go over a few other symptoms with you. Please let me know if you are experiencing any of the following: None of the below a. Ear, nose, or throat discomfort b. A sore throat c. Headache d. Muscle pain e. Diarrhea f. Loss of taste or smell

## 2018-10-17 ENCOUNTER — Other Ambulatory Visit: Payer: Self-pay

## 2018-10-17 ENCOUNTER — Ambulatory Visit (INDEPENDENT_AMBULATORY_CARE_PROVIDER_SITE_OTHER): Payer: BLUE CROSS/BLUE SHIELD | Admitting: Plastic Surgery

## 2018-10-17 ENCOUNTER — Encounter: Payer: Self-pay | Admitting: Plastic Surgery

## 2018-10-17 VITALS — BP 116/82 | HR 83 | Temp 98.5°F | Ht 66.0 in | Wt 147.0 lb

## 2018-10-17 DIAGNOSIS — L723 Sebaceous cyst: Secondary | ICD-10-CM

## 2018-10-17 MED ORDER — CEPHALEXIN 500 MG PO CAPS: 500.0000 mg | ORAL_CAPSULE | Freq: Four times a day (QID) | ORAL | 0 refills | Status: AC

## 2018-10-17 NOTE — Progress Notes (Signed)
Patient ID: Ana Rose, female    DOB: 07/21/1975, 43 y.o.   MRN: [id number]   Chief Complaint  Patient presents with  . Pre-op Exam    excision of (R) inner thigh sebaceous cyst    The patient is a 43 year old female here for history and physical for excision of a lesion on her right inner thigh.  She states that the area comes and goes.  It is acting like a sebaceous cyst.  There is quite a bit of bruising around the area.  She noted in the past week she was on doxycycline for a different reason and things seemed to improve.  Other than that it is constant increasing in size.  It is tender and sometimes painful.  She otherwise in good health.  She is not had any recent illnesses.   Review of Systems  Constitutional: Negative.  Negative for activity change and appetite change.  HENT: Negative.   Eyes: Negative.   Respiratory: Negative.  Negative for chest tightness and shortness of breath.   Cardiovascular: Negative.   Gastrointestinal: Negative.   Genitourinary: Negative.   Musculoskeletal: Negative.   Skin: Positive for color change.  Psychiatric/Behavioral: Negative.     Past Medical History:  Diagnosis Date  . ADD (attention deficit disorder)   . Alcohol dependence ([place])   . Anemia   . Anxiety   . Bipolar disorder ([place])   . Cardiomyopathy    POST PARTUM-resolved  . CHF (congestive heart failure) ([place]) 2001   post partum cardiomyopathy - resolved  . CIN I (cervical intraepithelial neoplasia I)   . Depression   . Epileptic seizures ([place]) 05/2011  . Fatty liver   . GERD (gastroesophageal reflux disease)   . Heart murmur    funtional  . Hiatal hernia   . History of dysfunctional uterine bleeding   . History of endometriosis   . History of gallstones   . History of rib fracture    Anterior left 7th  . History of stomach ulcers   . Hypercholesteremia 08/31/2016   Noted in note  . Hypothyroidism   . Irritable bowel syndrome (IBS)   . Ovarian cyst    Left   . Pneumonia    post partum  . PONV (postoperative nausea and vomiting)   . Pregnancy induced hypertension   . Sebaceous cyst 06/03/2015   Back  . Suicide attempt ([place]) 08/24/2012  . Urine incontinence   . Vitamin B 12 deficiency   . Wears glasses     Past Surgical History:  Procedure Laterality Date  . CHOLECYSTECTOMY  2001  . COLONOSCOPY  05/27/2017  . ESOPHAGOGASTRODUODENOSCOPY  05/2012  . GASTRIC BYPASS  2010  . GYNECOLOGIC CRYOSURGERY  Approximately age 54  . LAPAROSCOPIC TOTAL HYSTERECTOMY  7.16.2012   endometriosis on fallopian tubes  . TUBAL LIGATION  2001  . WISDOM TOOTH EXTRACTION        Current Outpatient Medications:  .  EQUETRO 100 MG CP12 12 hr capsule, TAKE ONE CAPSULE BY MOUTH EVERY MORNING AND TAKE 2 CAPSULES BY MOUTH AT BEDTIME, Disp: , Rfl:  .  gabapentin (NEURONTIN) 300 MG capsule, Take 1 capsule (300 mg total) by mouth daily. (Patient taking differently: Take 300-600 mg by mouth every 8 (eight) hours as needed (for anxiety). ), Disp: 30 capsule, Rfl: 0 .  lamoTRIgine (LAMICTAL) 200 MG tablet, Take 1 tablet (200 mg total) by mouth every morning. (Patient taking differently: Take 200 mg by mouth every morning. ),  Disp: 30 tablet, Rfl: 0 .  liothyronine (CYTOMEL) 50 MCG tablet, TAKE 1 TABLET BY MOUTH IN THE MORNING ON EMPTY STOMACH, Disp: , Rfl:  .  lisdexamfetamine (VYVANSE) 50 MG capsule, Take 1 capsule (50 mg total) by mouth every morning., Disp: 1 capsule, Rfl: 0 .  lithium carbonate (ESKALITH) 450 MG CR tablet, Take 450 mg by mouth at bedtime., Disp: , Rfl:  .  lithium carbonate (LITHOBID) 300 MG CR tablet, TAKE 1 TABLET BY MOUTH EVERYDAY AT BEDTIME, Disp: , Rfl:  .  Melatonin 3 MG TABS, Take 3 mg by mouth at bedtime., Disp: , Rfl:  .  Multiple Vitamin (MULTIVITAMIN) tablet, Take 1 tablet by mouth daily., Disp: 30 tablet, Rfl: 0 .  omega-3 acid ethyl esters (LOVAZA) 1 g capsule, Take 1 g by mouth daily., Disp: , Rfl:  .  ondansetron (ZOFRAN) 4 MG tablet,  Take 4 mg by mouth daily. , Disp: , Rfl:  .  pantoprazole (PROTONIX) 40 MG tablet, Take 1 tablet (40 mg total) by mouth at bedtime., Disp: 30 tablet, Rfl: 0 .  SAPHRIS 10 MG SUBL, TAKE 1 SUBLINGUAL TABLET BY MOUTH AT BEDTIME AS NEEDED FOR MANIA, Disp: , Rfl:  .  topiramate (TOPAMAX) 50 MG tablet, Take 100 mg by mouth at bedtime., Disp: , Rfl:  .  vitamin B-12 (CYANOCOBALAMIN) 1000 MCG tablet, Take 5 tablets (5,000 mcg total) by mouth daily., Disp: 150 tablet, Rfl: 0 .  Vitamin D, Ergocalciferol, (DRISDOL) 50000 units CAPS capsule, Take 50,000 Units by mouth every 7 (seven) days., Disp: , Rfl:    Objective:   Vitals:   10/17/18 0921  BP: 116/82  Pulse: 83  Temp: 98.5 F (36.9 C)  SpO2: 100%    Physical Exam Vitals signs and nursing note reviewed.  Constitutional:      Appearance: Normal appearance.  HENT:     Head: Normocephalic and atraumatic.     Nose: Nose normal.  Neck:     Musculoskeletal: Normal range of motion.  Cardiovascular:     Rate and Rhythm: Normal rate.  Pulmonary:     Effort: Pulmonary effort is normal.  Abdominal:     General: Abdomen is flat.  Musculoskeletal:       Legs:  Neurological:     General: No focal deficit present.     Mental Status: She is alert and oriented to person, place, and time.  Psychiatric:        Mood and Affect: Mood normal.        Behavior: Behavior normal.     Assessment & Plan:  Sebaceous cyst Antibiotic called into the pharmacy for after surgery.  Plan for excision of right inner thigh sebaceous cyst. The risks that can be encountered with and after excision of a skin lesion were discussed and include the following but not limited to these: bleeding, infection, delayed healing, anesthesia risks, skin sensation changes, injury to structures including nerves, blood vessels, and muscles which may be temporary or permanent, allergies to tape, suture materials and glues, blood products, topical preparations or injected agents,  skin contour irregularities, skin discoloration and swelling, deep vein thrombosis, cardiac and pulmonary complications, pain, which may persist, persistent pain, recurrence of the lesion, poor healing of the incision, possible need for revisional surgery or staged procedures.    [place], DO

## 2018-10-17 NOTE — H&P (View-Only) (Signed)
Patient ID: Ana Rose, female    DOB: [birth year], 43 y.o.   MRN: [id number]   Chief Complaint  Patient presents with  . Pre-op Exam    excision of (R) inner thigh sebaceous cyst    The patient is a 43 year old female here for history and physical for excision of a lesion on her right inner thigh.  She states that the area comes and goes.  It is acting like a sebaceous cyst.  There is quite a bit of bruising around the area.  She noted in the past week she was on doxycycline for a different reason and things seemed to improve.  Other than that it is constant increasing in size.  It is tender and sometimes painful.  She otherwise in good health.  She is not had any recent illnesses.   Review of Systems  Constitutional: Negative.  Negative for activity change and appetite change.  HENT: Negative.   Eyes: Negative.   Respiratory: Negative.  Negative for chest tightness and shortness of breath.   Cardiovascular: Negative.   Gastrointestinal: Negative.   Genitourinary: Negative.   Musculoskeletal: Negative.   Skin: Positive for color change.  Psychiatric/Behavioral: Negative.     Past Medical History:  Diagnosis Date  . ADD (attention deficit disorder)   . Alcohol dependence ([place])   . Anemia   . Anxiety   . Bipolar disorder ([place])   . Cardiomyopathy    POST PARTUM-resolved  . CHF (congestive heart failure) ([place]) 2001   post partum cardiomyopathy - resolved  . CIN I (cervical intraepithelial neoplasia I)   . Depression   . Epileptic seizures ([place]) 05/2011  . Fatty liver   . GERD (gastroesophageal reflux disease)   . Heart murmur    funtional  . Hiatal hernia   . History of dysfunctional uterine bleeding   . History of endometriosis   . History of gallstones   . History of rib fracture    Anterior left 7th  . History of stomach ulcers   . Hypercholesteremia 08/31/2016   Noted in note  . Hypothyroidism   . Irritable bowel syndrome (IBS)   . Ovarian cyst    Left   . Pneumonia    post partum  . PONV (postoperative nausea and vomiting)   . Pregnancy induced hypertension   . Sebaceous cyst 06/03/2015   Back  . Suicide attempt ([place]) 08/24/2012  . Urine incontinence   . Vitamin B 12 deficiency   . Wears glasses     Past Surgical History:  Procedure Laterality Date  . CHOLECYSTECTOMY  2001  . COLONOSCOPY  05/27/2017  . ESOPHAGOGASTRODUODENOSCOPY  05/2012  . GASTRIC BYPASS  2010  . GYNECOLOGIC CRYOSURGERY  Approximately age 20  . LAPAROSCOPIC TOTAL HYSTERECTOMY  7.16.2012   endometriosis on fallopian tubes  . TUBAL LIGATION  2001  . WISDOM TOOTH EXTRACTION        Current Outpatient Medications:  .  EQUETRO 100 MG CP12 12 hr capsule, TAKE ONE CAPSULE BY MOUTH EVERY MORNING AND TAKE 2 CAPSULES BY MOUTH AT BEDTIME, Disp: , Rfl:  .  gabapentin (NEURONTIN) 300 MG capsule, Take 1 capsule (300 mg total) by mouth daily. (Patient taking differently: Take 300-600 mg by mouth every 8 (eight) hours as needed (for anxiety). ), Disp: 30 capsule, Rfl: 0 .  lamoTRIgine (LAMICTAL) 200 MG tablet, Take 1 tablet (200 mg total) by mouth every morning. (Patient taking differently: Take 200 mg by mouth every morning. ),  Disp: 30 tablet, Rfl: 0 .  liothyronine (CYTOMEL) 50 MCG tablet, TAKE 1 TABLET BY MOUTH IN THE MORNING ON EMPTY STOMACH, Disp: , Rfl:  .  lisdexamfetamine (VYVANSE) 50 MG capsule, Take 1 capsule (50 mg total) by mouth every morning., Disp: 1 capsule, Rfl: 0 .  lithium carbonate (ESKALITH) 450 MG CR tablet, Take 450 mg by mouth at bedtime., Disp: , Rfl:  .  lithium carbonate (LITHOBID) 300 MG CR tablet, TAKE 1 TABLET BY MOUTH EVERYDAY AT BEDTIME, Disp: , Rfl:  .  Melatonin 3 MG TABS, Take 3 mg by mouth at bedtime., Disp: , Rfl:  .  Multiple Vitamin (MULTIVITAMIN) tablet, Take 1 tablet by mouth daily., Disp: 30 tablet, Rfl: 0 .  omega-3 acid ethyl esters (LOVAZA) 1 g capsule, Take 1 g by mouth daily., Disp: , Rfl:  .  ondansetron (ZOFRAN) 4 MG tablet,  Take 4 mg by mouth daily. , Disp: , Rfl:  .  pantoprazole (PROTONIX) 40 MG tablet, Take 1 tablet (40 mg total) by mouth at bedtime., Disp: 30 tablet, Rfl: 0 .  SAPHRIS 10 MG SUBL, TAKE 1 SUBLINGUAL TABLET BY MOUTH AT BEDTIME AS NEEDED FOR MANIA, Disp: , Rfl:  .  topiramate (TOPAMAX) 50 MG tablet, Take 100 mg by mouth at bedtime., Disp: , Rfl:  .  vitamin B-12 (CYANOCOBALAMIN) 1000 MCG tablet, Take 5 tablets (5,000 mcg total) by mouth daily., Disp: 150 tablet, Rfl: 0 .  Vitamin D, Ergocalciferol, (DRISDOL) 50000 units CAPS capsule, Take 50,000 Units by mouth every 7 (seven) days., Disp: , Rfl:    Objective:   Vitals:   10/17/18 0921  BP: 116/82  Pulse: 83  Temp: 98.5 F (36.9 C)  SpO2: 100%    Physical Exam Vitals signs and nursing note reviewed.  Constitutional:      Appearance: Normal appearance.  HENT:     Head: Normocephalic and atraumatic.     Nose: Nose normal.  Neck:     Musculoskeletal: Normal range of motion.  Cardiovascular:     Rate and Rhythm: Normal rate.  Pulmonary:     Effort: Pulmonary effort is normal.  Abdominal:     General: Abdomen is flat.  Musculoskeletal:       Legs:  Neurological:     General: No focal deficit present.     Mental Status: She is alert and oriented to person, place, and time.  Psychiatric:        Mood and Affect: Mood normal.        Behavior: Behavior normal.     Assessment & Plan:  Sebaceous cyst Antibiotic called into the pharmacy for after surgery.  Plan for excision of right inner thigh sebaceous cyst. The risks that can be encountered with and after excision of a skin lesion were discussed and include the following but not limited to these: bleeding, infection, delayed healing, anesthesia risks, skin sensation changes, injury to structures including nerves, blood vessels, and muscles which may be temporary or permanent, allergies to tape, suture materials and glues, blood products, topical preparations or injected agents,  skin contour irregularities, skin discoloration and swelling, deep vein thrombosis, cardiac and pulmonary complications, pain, which may persist, persistent pain, recurrence of the lesion, poor healing of the incision, possible need for revisional surgery or staged procedures.    [place], DO

## 2018-10-18 ENCOUNTER — Encounter (HOSPITAL_BASED_OUTPATIENT_CLINIC_OR_DEPARTMENT_OTHER): Payer: Self-pay | Admitting: *Deleted

## 2018-10-18 ENCOUNTER — Other Ambulatory Visit: Payer: Self-pay

## 2018-10-20 ENCOUNTER — Other Ambulatory Visit (HOSPITAL_COMMUNITY)
Admission: RE | Admit: 2018-10-20 | Discharge: 2018-10-20 | Disposition: A | Discharge status: Home / Self Care | Payer: BLUE CROSS/BLUE SHIELD | Source: Ambulatory Visit | Attending: Plastic Surgery | Admitting: Plastic Surgery

## 2018-10-20 DIAGNOSIS — Z1159 Encounter for screening for other viral diseases: Secondary | ICD-10-CM | POA: Insufficient documentation

## 2018-10-21 LAB — NOVEL CORONAVIRUS, NAA (HOSP ORDER, SEND-OUT TO REF LAB; TAT 18-24 HRS): SARS-CoV-2, NAA: NOT DETECTED

## 2018-10-23 ENCOUNTER — Other Ambulatory Visit: Payer: Self-pay

## 2018-10-24 ENCOUNTER — Encounter: Payer: Self-pay | Admitting: Gynecology

## 2018-10-24 ENCOUNTER — Other Ambulatory Visit: Payer: Self-pay

## 2018-10-24 ENCOUNTER — Encounter (HOSPITAL_BASED_OUTPATIENT_CLINIC_OR_DEPARTMENT_OTHER): Payer: Self-pay | Admitting: Plastic Surgery

## 2018-10-24 ENCOUNTER — Ambulatory Visit (HOSPITAL_BASED_OUTPATIENT_CLINIC_OR_DEPARTMENT_OTHER): Payer: BC Managed Care – PPO | Admitting: Anesthesiology

## 2018-10-24 ENCOUNTER — Encounter (HOSPITAL_BASED_OUTPATIENT_CLINIC_OR_DEPARTMENT_OTHER)
Admission: RE | Disposition: A | Discharge status: Home / Self Care | Payer: Self-pay | Source: Home / Self Care | Attending: Plastic Surgery

## 2018-10-24 ENCOUNTER — Ambulatory Visit (INDEPENDENT_AMBULATORY_CARE_PROVIDER_SITE_OTHER): Payer: BC Managed Care – PPO | Admitting: Gynecology

## 2018-10-24 ENCOUNTER — Ambulatory Visit (HOSPITAL_BASED_OUTPATIENT_CLINIC_OR_DEPARTMENT_OTHER)
Admission: RE | Admit: 2018-10-24 | Discharge: 2018-10-24 | Disposition: A | Discharge status: Home / Self Care | Payer: BC Managed Care – PPO | Attending: Plastic Surgery | Admitting: Plastic Surgery

## 2018-10-24 VITALS — BP 120/78

## 2018-10-24 DIAGNOSIS — L720 Epidermal cyst: Secondary | ICD-10-CM | POA: Diagnosis present

## 2018-10-24 DIAGNOSIS — G40909 Epilepsy, unspecified, not intractable, without status epilepticus: Secondary | ICD-10-CM | POA: Insufficient documentation

## 2018-10-24 DIAGNOSIS — Z79899 Other long term (current) drug therapy: Secondary | ICD-10-CM | POA: Diagnosis not present

## 2018-10-24 DIAGNOSIS — D649 Anemia, unspecified: Secondary | ICD-10-CM | POA: Diagnosis not present

## 2018-10-24 DIAGNOSIS — K760 Fatty (change of) liver, not elsewhere classified: Secondary | ICD-10-CM | POA: Diagnosis not present

## 2018-10-24 DIAGNOSIS — F988 Other specified behavioral and emotional disorders with onset usually occurring in childhood and adolescence: Secondary | ICD-10-CM | POA: Insufficient documentation

## 2018-10-24 DIAGNOSIS — E039 Hypothyroidism, unspecified: Secondary | ICD-10-CM | POA: Insufficient documentation

## 2018-10-24 DIAGNOSIS — R011 Cardiac murmur, unspecified: Secondary | ICD-10-CM | POA: Diagnosis not present

## 2018-10-24 DIAGNOSIS — N83209 Unspecified ovarian cyst, unspecified side: Secondary | ICD-10-CM | POA: Diagnosis not present

## 2018-10-24 DIAGNOSIS — K589 Irritable bowel syndrome without diarrhea: Secondary | ICD-10-CM | POA: Insufficient documentation

## 2018-10-24 DIAGNOSIS — E538 Deficiency of other specified B group vitamins: Secondary | ICD-10-CM | POA: Diagnosis not present

## 2018-10-24 DIAGNOSIS — F319 Bipolar disorder, unspecified: Secondary | ICD-10-CM | POA: Insufficient documentation

## 2018-10-24 DIAGNOSIS — N83202 Unspecified ovarian cyst, left side: Secondary | ICD-10-CM

## 2018-10-24 DIAGNOSIS — Z9049 Acquired absence of other specified parts of digestive tract: Secondary | ICD-10-CM | POA: Insufficient documentation

## 2018-10-24 DIAGNOSIS — I429 Cardiomyopathy, unspecified: Secondary | ICD-10-CM | POA: Diagnosis not present

## 2018-10-24 DIAGNOSIS — K449 Diaphragmatic hernia without obstruction or gangrene: Secondary | ICD-10-CM | POA: Diagnosis not present

## 2018-10-24 DIAGNOSIS — K219 Gastro-esophageal reflux disease without esophagitis: Secondary | ICD-10-CM | POA: Insufficient documentation

## 2018-10-24 DIAGNOSIS — F419 Anxiety disorder, unspecified: Secondary | ICD-10-CM | POA: Insufficient documentation

## 2018-10-24 DIAGNOSIS — Z8711 Personal history of peptic ulcer disease: Secondary | ICD-10-CM | POA: Diagnosis not present

## 2018-10-24 DIAGNOSIS — R102 Pelvic and perineal pain: Secondary | ICD-10-CM | POA: Diagnosis not present

## 2018-10-24 DIAGNOSIS — Z87891 Personal history of nicotine dependence: Secondary | ICD-10-CM | POA: Insufficient documentation

## 2018-10-24 DIAGNOSIS — Z90710 Acquired absence of both cervix and uterus: Secondary | ICD-10-CM | POA: Diagnosis not present

## 2018-10-24 DIAGNOSIS — Z9884 Bariatric surgery status: Secondary | ICD-10-CM | POA: Insufficient documentation

## 2018-10-24 HISTORY — PX: CYST EXCISION: SHX5701

## 2018-10-24 SURGERY — CYST REMOVAL
Anesthesia: General | Site: Thigh | Laterality: Right

## 2018-10-24 MED ORDER — LIDOCAINE 2% (20 MG/ML) 5 ML SYRINGE
INTRAMUSCULAR | Status: DC | PRN
  Administered 2018-10-24: 50 mg via INTRAVENOUS

## 2018-10-24 MED ORDER — ONDANSETRON HCL 4 MG/2ML IJ SOLN
INTRAMUSCULAR | Status: DC | PRN
  Administered 2018-10-24: 4 mg via INTRAVENOUS

## 2018-10-24 MED ORDER — LACTATED RINGERS IV SOLN
INTRAVENOUS | Status: DC
  Administered 2018-10-24: 08:00:00 via INTRAVENOUS

## 2018-10-24 MED ORDER — SCOPOLAMINE 1 MG/3DAYS TD PT72: 1.0000 | MEDICATED_PATCH | Freq: Once | TRANSDERMAL | Status: DC | PRN

## 2018-10-24 MED ORDER — LIDOCAINE-EPINEPHRINE 1 %-1:100000 IJ SOLN
INTRAMUSCULAR | Status: DC | PRN
  Administered 2018-10-24: 4 mL

## 2018-10-24 MED ORDER — FENTANYL CITRATE (PF) 100 MCG/2ML IJ SOLN: 25.0000 ug | INTRAMUSCULAR | Status: DC | PRN

## 2018-10-24 MED ORDER — SODIUM CHLORIDE 0.9% FLUSH: 3.0000 mL | INTRAVENOUS | Status: DC | PRN

## 2018-10-24 MED ORDER — MIDAZOLAM HCL 2 MG/2ML IJ SOLN
1.0000 mg | INTRAMUSCULAR | Status: DC | PRN
  Administered 2018-10-24: 2 mg via INTRAVENOUS

## 2018-10-24 MED ORDER — CEFAZOLIN SODIUM-DEXTROSE 2-4 GM/100ML-% IV SOLN
INTRAVENOUS | Status: AC
  Filled 2018-10-24: qty 100

## 2018-10-24 MED ORDER — FENTANYL CITRATE (PF) 100 MCG/2ML IJ SOLN
50.0000 ug | INTRAMUSCULAR | Status: DC | PRN
  Administered 2018-10-24: 100 ug via INTRAVENOUS

## 2018-10-24 MED ORDER — CHLORHEXIDINE GLUCONATE 4 % EX LIQD: 1.0000 "application " | Freq: Once | CUTANEOUS | Status: DC

## 2018-10-24 MED ORDER — SODIUM CHLORIDE 0.9 % IV SOLN: 250.0000 mL | INTRAVENOUS | Status: DC | PRN

## 2018-10-24 MED ORDER — SODIUM CHLORIDE 0.9% FLUSH: 3.0000 mL | Freq: Two times a day (BID) | INTRAVENOUS | Status: DC

## 2018-10-24 MED ORDER — FENTANYL CITRATE (PF) 100 MCG/2ML IJ SOLN
INTRAMUSCULAR | Status: AC
  Filled 2018-10-24: qty 2

## 2018-10-24 MED ORDER — MEPERIDINE HCL 25 MG/ML IJ SOLN: 6.2500 mg | INTRAMUSCULAR | Status: DC | PRN

## 2018-10-24 MED ORDER — DEXAMETHASONE SODIUM PHOSPHATE 4 MG/ML IJ SOLN
INTRAMUSCULAR | Status: DC | PRN
  Administered 2018-10-24: 10 mg via INTRAVENOUS

## 2018-10-24 MED ORDER — PROPOFOL 10 MG/ML IV BOLUS
INTRAVENOUS | Status: DC | PRN
  Administered 2018-10-24: 200 mg via INTRAVENOUS

## 2018-10-24 MED ORDER — LACTATED RINGERS IV SOLN
INTRAVENOUS | Status: DC
  Administered 2018-10-24: 10:00:00 via INTRAVENOUS

## 2018-10-24 MED ORDER — CEFAZOLIN SODIUM-DEXTROSE 2-4 GM/100ML-% IV SOLN
2.0000 g | INTRAVENOUS | Status: AC
  Administered 2018-10-24: 2 g via INTRAVENOUS

## 2018-10-24 MED ORDER — LIDOCAINE-EPINEPHRINE 1 %-1:100000 IJ SOLN
INTRAMUSCULAR | Status: AC
  Filled 2018-10-24: qty 1

## 2018-10-24 MED ORDER — METOCLOPRAMIDE HCL 5 MG/ML IJ SOLN: 10.0000 mg | Freq: Once | INTRAMUSCULAR | Status: DC | PRN

## 2018-10-24 MED ORDER — MIDAZOLAM HCL 2 MG/2ML IJ SOLN
INTRAMUSCULAR | Status: AC
  Filled 2018-10-24: qty 2

## 2018-10-24 SURGICAL SUPPLY — 53 items
BLADE CLIPPER SURG (BLADE) IMPLANT
BLADE SURG 15 STRL LF DISP TIS (BLADE) ×1 IMPLANT
BLADE SURG 15 STRL SS (BLADE) ×1
CANISTER SUCT 1200ML W/VALVE (MISCELLANEOUS) IMPLANT
CHLORAPREP W/TINT 26 (MISCELLANEOUS) IMPLANT
CORD BIPOLAR FORCEPS 12FT (ELECTRODE) IMPLANT
COVER BACK TABLE REUSABLE LG (DRAPES) ×2 IMPLANT
COVER MAYO STAND REUSABLE (DRAPES) ×2 IMPLANT
COVER WAND RF STERILE (DRAPES) IMPLANT
DERMABOND ADVANCED (GAUZE/BANDAGES/DRESSINGS) ×1
DERMABOND ADVANCED .7 DNX12 (GAUZE/BANDAGES/DRESSINGS) ×1 IMPLANT
DRAPE U-SHAPE 76X120 STRL (DRAPES) ×2 IMPLANT
DRSG TEGADERM 2-3/8X2-3/4 SM (GAUZE/BANDAGES/DRESSINGS) IMPLANT
ELECT COATED BLADE 2.86 ST (ELECTRODE) IMPLANT
ELECT NEEDLE BLADE 2-5/6 (NEEDLE) ×2 IMPLANT
ELECT REM PT RETURN 9FT ADLT (ELECTROSURGICAL)
ELECT REM PT RETURN 9FT PED (ELECTROSURGICAL)
ELECTRODE REM PT RETRN 9FT PED (ELECTROSURGICAL) IMPLANT
ELECTRODE REM PT RTRN 9FT ADLT (ELECTROSURGICAL) IMPLANT
GAUZE SPONGE 4X4 12PLY STRL LF (GAUZE/BANDAGES/DRESSINGS) IMPLANT
GLOVE BIO SURGEON STRL SZ 6.5 (GLOVE) ×6 IMPLANT
GLOVE BIOGEL PI IND STRL 6.5 (GLOVE) ×1 IMPLANT
GLOVE BIOGEL PI INDICATOR 6.5 (GLOVE) ×1
GOWN STRL REUS W/ TWL LRG LVL3 (GOWN DISPOSABLE) ×2 IMPLANT
GOWN STRL REUS W/TWL LRG LVL3 (GOWN DISPOSABLE) ×2
NEEDLE HYPO 30GX1 BEV (NEEDLE) ×2 IMPLANT
NEEDLE PRECISIONGLIDE 27X1.5 (NEEDLE) IMPLANT
NS IRRIG 1000ML POUR BTL (IV SOLUTION) IMPLANT
PACK BASIN DAY SURGERY FS (CUSTOM PROCEDURE TRAY) ×2 IMPLANT
PENCIL BUTTON HOLSTER BLD 10FT (ELECTRODE) ×2 IMPLANT
RUBBERBAND STERILE (MISCELLANEOUS) IMPLANT
SHEET MEDIUM DRAPE 40X70 STRL (DRAPES) IMPLANT
SPONGE GAUZE 2X2 8PLY STRL LF (GAUZE/BANDAGES/DRESSINGS) ×2 IMPLANT
STRIP CLOSURE SKIN 1/2X4 (GAUZE/BANDAGES/DRESSINGS) IMPLANT
STRIP SUTURE WOUND CLOSURE 1/2 (SUTURE) IMPLANT
SUCTION FRAZIER HANDLE 10FR (MISCELLANEOUS)
SUCTION TUBE FRAZIER 10FR DISP (MISCELLANEOUS) IMPLANT
SUT ETHILON 5 0 P 3 18 (SUTURE)
SUT MNCRL 6-0 UNDY P1 1X18 (SUTURE) ×1 IMPLANT
SUT MNCRL AB 4-0 PS2 18 (SUTURE) ×2 IMPLANT
SUT MON AB 5-0 P3 18 (SUTURE) ×2 IMPLANT
SUT MONOCRYL 6-0 P1 1X18 (SUTURE) ×1
SUT NYLON ETHILON 5-0 P-3 1X18 (SUTURE) IMPLANT
SUT PLAIN 5 0 P 3 18 (SUTURE) IMPLANT
SUT VIC AB 5-0 P-3 18X BRD (SUTURE) IMPLANT
SUT VIC AB 5-0 P3 18 (SUTURE)
SUT VICRYL 4-0 PS2 18IN ABS (SUTURE) IMPLANT
SYR BULB 3OZ (MISCELLANEOUS) IMPLANT
SYR CONTROL 10ML LL (SYRINGE) ×2 IMPLANT
TAPE CLOTH SURG 4X10 WHT LF (GAUZE/BANDAGES/DRESSINGS) ×2 IMPLANT
TOWEL GREEN STERILE FF (TOWEL DISPOSABLE) ×2 IMPLANT
TRAY DSU PREP LF (CUSTOM PROCEDURE TRAY) ×2 IMPLANT
TUBE CONNECTING 20X1/4 (TUBING) IMPLANT

## 2018-10-24 NOTE — Transfer of Care (Signed)
Immediate Anesthesia Transfer of Care Note  Patient: EZZIE SENAT  Procedure(s) Performed: Excision of right inner thigh sebaceous cyst (Right Thigh)  Patient Location: PACU  Anesthesia Type:General  Level of Consciousness: awake, alert  and oriented  Airway & Oxygen Therapy: Patient Spontanous Breathing and Patient connected to face mask oxygen  Post-op Assessment: Report given to RN and Post -op Vital signs reviewed and stable  Post vital signs: Reviewed and stable  Last Vitals:  Vitals Value Taken Time  BP    Temp    Pulse 83 10/24/2018 10:02 AM  Resp 14 10/24/2018 10:02 AM  SpO2 100 % 10/24/2018 10:02 AM  Vitals shown include unvalidated device data.  Last Pain:  Vitals:   10/24/18 0905  TempSrc: Oral  PainSc: 0-No pain         Complications: No apparent anesthesia complications

## 2018-10-24 NOTE — Discharge Instructions (Signed)
INSTRUCTIONS FOR AFTER SURGERY   You are having surgery.  You will likely have some questions about what to expect following your operation.  The following information will help you and your family understand what to expect when you are discharged from the hospital.  Following these guidelines will help ensure a smooth recovery and reduce risks of complications.   Postoperative instructions include information on: diet, wound care, medications and physical activity.  AFTER SURGERY Expect to go home after the procedure.  In some cases, you may need to spend one night in the hospital for observation.  DIET This surgery does not require a specific diet.  However, I have to mention that the healthier you eat the better your body can start healing. It is important to increasing your protein intake.  This means limiting the foods with sugar and carbohydrates.  Focus on vegetables and some meat.  If you have any liposuction during your procedure be sure to drink water.  If your urine is bright yellow, then it is concentrated, and you need to drink more water.  As a general rule after surgery, you should have 8 ounces of water every hour while awake.  If you find you are persistently nauseated or unable to take in liquids let us know.  NO TOBACCO USE or EXPOSURE.  This will slow your healing process and increase the risk of a wound.  WOUND CARE You can shower the day after surgery.  Use fragrance free soap.  Dial, [place] and [place] are usually mild on the skin. If you have steri-strips / tape directly attached to your skin leave them in place. It is OK to get these wet.  No baths, pools or hot tubs for two weeks. You have derma bond so you can remove the outer tape tomorrow.. We close your incision to leave the smallest and best-looking scar. No ointment or creams on your incisions until given the go ahead.  Especially not Neosporin (Too many skin reactions with this one).  A few weeks after surgery you can use  Mederma and start massaging the scar. We ask you to wear your binder or sports bra for the first 6 weeks around the clock, including while sleeping. This provides added comfort and helps reduce the fluid accumulation at the surgery site.  ACTIVITY No heavy lifting until cleared by the doctor.  It is OK to walk and climb stairs. In fact, moving your legs is very important to decrease your risk of a blood clot.  It will also help keep you from getting deconditioned.  Every 1 to 2 hours get up and walk for 5 minutes. This will help with a quicker recovery back to normal.  Let pain be your guide so you don't do too much.  NO, you cannot do the spring cleaning and don't plan on taking care of anyone else.  This is your time for TLC.  You will be more comfortable if you sleep and rest with your head elevated either with a few pillows under you or in a recliner.  No stomach sleeping for a few months.  WORK Everyone returns to work at different times. As a rough guide, most people take at least 1 - 2 weeks off prior to returning to work. If you need documentation for your job, bring the forms to your postoperative follow up visit.  DRIVING Arrange for someone to bring you home from the hospital.  You may be able to drive a few days after  surgery but not while taking any narcotics or valium.  BOWEL MOVEMENTS Constipation can occur after anesthesia and while taking pain medication.  It is important to stay ahead for your comfort.  We recommend taking Milk of Magnesia (2 tablespoons; twice a day) while taking the pain pills.  SEROMA This is fluid your body tried to put in the surgical site.  This is normal but if it creates tight skinny skin let us know.  It usually decreases in a few weeks.  WHEN TO CALL Call your surgeon's office if any of the following occur:  Fever 101 degrees F or greater  Excessive bleeding or fluid from the incision site.  Pain that increases over time without aid from the  medications  Redness, warmth, or pus draining from incision sites  Persistent nausea or inability to take in liquids  Severe misshapen area that underwent the operation.    Post Anesthesia Home Care Instructions  Activity: Get plenty of rest for the remainder of the day. A responsible individual must stay with you for 24 hours following the procedure.  For the next 24 hours, DO NOT: -Drive a car -Paediatric nurse -Drink alcoholic beverages -Take any medication unless instructed by your physician -Make any legal decisions or sign important papers.  Meals: Start with liquid foods such as gelatin or soup. Progress to regular foods as tolerated. Avoid greasy, spicy, heavy foods. If nausea and/or vomiting occur, drink only clear liquids until the nausea and/or vomiting subsides. Call your physician if vomiting continues.  Special Instructions/Symptoms: Your throat may feel dry or sore from the anesthesia or the breathing tube placed in your throat during surgery. If this causes discomfort, gargle with warm salt water. The discomfort should disappear within 24 hours.  If you had a scopolamine patch placed behind your ear for the management of post- operative nausea and/or vomiting:  1. The medication in the patch is effective for 72 hours, after which it should be removed.  Wrap patch in a tissue and discard in the trash. Wash hands thoroughly with soap and water. 2. You may remove the patch earlier than 72 hours if you experience unpleasant side effects which may include dry mouth, dizziness or visual disturbances. 3. Avoid touching the patch. Wash your hands with soap and water after contact with the patch.

## 2018-10-24 NOTE — Patient Instructions (Signed)
Followup for surgery as scheduled. 

## 2018-10-24 NOTE — Anesthesia Preprocedure Evaluation (Signed)
Anesthesia Evaluation  Patient identified by MRN, date of birth, ID band Patient awake    Reviewed: Allergy & Precautions, NPO status , Patient's Chart, lab work & pertinent test results  History of Anesthesia Complications (+) PONV  Airway Mallampati: II  TM Distance: >3 FB Neck ROM: Full    Dental no notable dental hx.    Pulmonary neg pulmonary ROS, former smoker,    Pulmonary exam normal breath sounds clear to auscultation       Cardiovascular negative cardio ROS Normal cardiovascular exam Rhythm:Regular Rate:Normal     Neuro/Psych negative neurological ROS  negative psych ROS   GI/Hepatic Neg liver ROS, GERD  Medicated and Controlled,  Endo/Other  negative endocrine ROS  Renal/GU negative Renal ROS  negative genitourinary   Musculoskeletal negative musculoskeletal ROS (+)   Abdominal   Peds negative pediatric ROS (+)  Hematology negative hematology ROS (+)   Anesthesia Other Findings   Reproductive/Obstetrics negative OB ROS                             Anesthesia Physical Anesthesia Plan  ASA: II  Anesthesia Plan: General   Post-op Pain Management:    Induction: Intravenous  PONV Risk Score and Plan: 4 or greater and Ondansetron, Dexamethasone, Midazolam and Scopolamine patch - Pre-op  Airway Management Planned: LMA  Additional Equipment:   Intra-op Plan:   Post-operative Plan: Extubation in OR  Informed Consent: I have reviewed the patients History and Physical, chart, labs and discussed the procedure including the risks, benefits and alternatives for the proposed anesthesia with the patient or authorized representative who has indicated his/her understanding and acceptance.     Dental advisory given  Plan Discussed with: CRNA  Anesthesia Plan Comments:         Anesthesia Quick Evaluation

## 2018-10-24 NOTE — Anesthesia Postprocedure Evaluation (Signed)
Anesthesia Post Note  Patient: Ana Rose  Procedure(s) Performed: Excision of right inner thigh sebaceous cyst (Right Thigh)     Patient location during evaluation: PACU Anesthesia Type: General Level of consciousness: awake and alert Pain management: pain level controlled Vital Signs Assessment: post-procedure vital signs reviewed and stable Respiratory status: spontaneous breathing, nonlabored ventilation, respiratory function stable and patient connected to nasal cannula oxygen Cardiovascular status: blood pressure returned to baseline and stable Postop Assessment: no apparent nausea or vomiting Anesthetic complications: no    Last Vitals:  Vitals:   10/24/18 1015 10/24/18 1050  BP: (!) 99/52 122/75  Pulse: 76 76  Resp: 13 18  Temp:  (!) 36.2 C  SpO2: 100% 100%    Last Pain:  Vitals:   10/24/18 1050  TempSrc:   PainSc: 3                  Montez Hageman

## 2018-10-24 NOTE — Interval H&P Note (Signed)
History and Physical Interval Note:  10/24/2018 8:57 AM  Ana Rose  has presented today for surgery, with the diagnosis of sebaceous cyst of thigh.  The various methods of treatment have been discussed with the patient and family. After consideration of risks, benefits and other options for treatment, the patient has consented to  Procedure(s): Excision of right inner thigh sebaceous cyst (Right) as a surgical intervention.  The patient's history has been reviewed, patient examined, no change in status, stable for surgery.  I have reviewed the patient's chart and labs.  Questions were answered to the patient's satisfaction.     Loel Lofty Jasmeet Manton

## 2018-10-24 NOTE — Anesthesia Procedure Notes (Signed)
Procedure Name: LMA Insertion Date/Time: 10/24/2018 9:30 AM Performed by: Willa Frater, CRNA Pre-anesthesia Checklist: Patient identified, Emergency Drugs available, Suction available and Patient being monitored Patient Re-evaluated:Patient Re-evaluated prior to induction Oxygen Delivery Method: Circle system utilized Preoxygenation: Pre-oxygenation with 100% oxygen Induction Type: IV induction Ventilation: Mask ventilation without difficulty LMA: LMA inserted LMA Size: 4.0 Number of attempts: 1 Airway Equipment and Method: Bite block Placement Confirmation: positive ETCO2 Tube secured with: Tape Dental Injury: Teeth and Oropharynx as per pre-operative assessment

## 2018-10-24 NOTE — H&P (Signed)
ZYKIRIA BRUENING 11-Mar-1976 [id number]   History and Physical  Chief complaint: Persistent left pelvic pain, left ovarian cyst, history of endometriosis  History of present illness: 43 y.o. G1P1001 with a history of over the past 2 years she has had intermittent left lower quadrant cramping feeling like menstrual cramps and that her period is about ready to start.  Occurs every 2 weeks or so leading to bed rest and pain medication.  She has a history of TLH with findings of endometriosis on the fallopian tubes that were fulgurated.  She had a CT scan which showed an enlarged left ovary measuring 6 x 3.6 x 3.8 cm with a 3 cm cyst.  Follow-up ultrasound of the ovary showed 2 thin-walled cystic areas with low-level internal echoes and negative color flow Doppler measuring 15 x 16 mm and 14 x 12 mm.  Cul-de-sac was negative.    Right ovary was normal on ultrasound.  We have discussed various options for management and ultimately she elects for laparoscopic LSO with right salpingectomy for risk reductive surgery.  Past Medical History:  Diagnosis Date  . ADD (attention deficit disorder)   . Anemia   . Anxiety   . Bipolar disorder ([place])   . CIN I (cervical intraepithelial neoplasia I)   . Depression   . Epileptic seizures ([place])    Last seizure at 43 years of age  . GERD (gastroesophageal reflux disease)   . Heart murmur    funtional  . Hiatal hernia   . History of dysfunctional uterine bleeding   . Hypercholesteremia 08/31/2016   Noted in note  . Hypothyroidism   . Irritable bowel syndrome (IBS)   . PONV (postoperative nausea and vomiting)   . Suicide attempt ([place]) 08/24/2012    Past Surgical History:  Procedure Laterality Date  . CHOLECYSTECTOMY  2001  . COLONOSCOPY  05/27/2017  . ESOPHAGOGASTRODUODENOSCOPY  05/2012  . GASTRIC BYPASS  2010  . GYNECOLOGIC CRYOSURGERY  Approximately age 58  . LAPAROSCOPIC TOTAL HYSTERECTOMY  7.16.2012   endometriosis on fallopian tubes  . TUBAL  LIGATION  2001  . WISDOM TOOTH EXTRACTION      Family History  Problem Relation Age of Onset  . Hypertension Mother   . Hypertension Father   . Diabetes Paternal Grandmother   . Cancer Paternal Grandmother        OV/UT  . Breast cancer Paternal Grandmother 39  . Breast cancer Cousin 34       MATERNAL  . Alcoholism Paternal Grandfather     Social History:  reports that she quit smoking about 19 years ago. Her smoking use included cigarettes. She has never used smokeless tobacco. She reports current alcohol use. She reports that she does not use drugs.  Allergies:  Allergies  Allergen Reactions  . Hydrocodone Nausea And Vomiting  . Lactose Intolerance (Gi) Nausea And Vomiting  . Nsaids Other (See Comments)    Oral NSAIDS contraindicated due to Gastric Bypass  . Vicodin [Hydrocodone-Acetaminophen] Nausea And Vomiting  . Xanax [Alprazolam] Other (See Comments)    Over sedation    Medications: See Epic for the most current list of medications.  ROS:  Was performed and pertinent positives and negatives are included in the history of present illness.  Exam:  [name] assistant Vitals:   10/24/18 1426  BP: 120/78   General appearance:  Normal HEENT normal Lungs clear Cardiac regular rate no rubs murmurs or gallops Abdomen soft nontender without masses guarding rebound Pelvic external BUS  vagina normal.  Bimanual without masses or tenderness.    Assessment/Plan:  43 y.o. G1P1001 with history as above.  Ultrasound suggest 2 small endometriomas on the left ovary.  We discussed the differential to include adhesions, endometriosis, other ovarian pathology, non-gynecologic etiology and no pathology found.  She also understands there is a risk that she will have persistent pain following the procedure.  Risk of leaving her right ovary with development of right-sided pain or recurrent pain also discussed.  The risks of removing both ovaries and lack of estrogen with long-term  issues and HRT discussed.  At this point the patient clearly wants me to leave her right ovary but she does give me permission to remove the right ovary along with the left ovary if it is my best intraoperative decision to do so and she would consider HRT afterwards.  She also understands that if there is scarring or other issues that would require a larger incision to remove the left side that she wants me to proceed at the time of surgery to make a larger incision and she would accept the larger incision and a longer recovery time.  The expected intraoperative and postoperative courses as well as the recovery period were reviewed. The risks of infection, prolonged antibiotics, reoperation for abscess or hematoma formation was discussed. The risks of hemorrhage necessitating transfusion and the risks of transfusion reaction, hepatitis, HIV, mad cow disease and other unknown entities was also discussed. Incisional complications to include opening and draining of incisions and closure by secondary intention, dehiscence and long-term issues of keloid/cosmetics and hernia formation were reviewed. The risk of inadvertent injury to internal organs including bowel, bladder, ureters, vessels, nerves either immediately recognized or delay recognized necessitating major exploratory reparative surgeries and future reparative surgeries including bowel resection, ostomy formation, bladder repair, ureteral damage repair was discussed with her.  She understands due to her prior surgery there may be distortion of anatomy and increased risk of inadvertent injury.  The patient's questions were answered to her satisfaction and she is ready to proceed with surgery.    [name] MD, 3:10 PM 10/24/2018

## 2018-10-24 NOTE — Progress Notes (Signed)
Ana Rose [birth year] [id number]        43 y.o.  G1P1001 presents for her preoperative consult for upcoming laparoscopic left salpingo-oophorectomy right salpingectomy.  Over the past 2 years she has had intermittent left lower quadrant cramping feeling like menstrual cramps and that her period is about ready to start.  Occurs every 2 weeks or so leading to bed rest and pain medication.  She has a history of TLH with findings of endometriosis on the fallopian tubes that were fulgurated.  She had a CT scan which showed an enlarged left ovary measuring 6 x 3.6 x 3.8 cm with a 3 cm cyst.  Follow-up ultrasound showed 2 thin-walled cystic areas with low-level internal echoes and negative color flow Doppler measuring 15 x 16 mm and 14 x 12 mm.  Cul-de-sac was negative.  We have discussed various options for management and ultimately she elects for laparoscopic LSO with right salpingectomy for risk reductive surgery.  Past medical history,surgical history, problem list, medications, allergies, family history and social history were all reviewed and documented in the EPIC chart.  Directed ROS with pertinent positives and negatives documented in the history of present illness/assessment and plan.  Exam: [name] assistant Vitals:   10/24/18 1426  BP: 120/78   General appearance:  Normal HEENT normal Lungs clear Cardiac regular rate no rubs murmurs or gallops Abdomen soft nontender without masses guarding rebound Pelvic external BUS vagina normal.  Bimanual without masses or tenderness.  Assessment/Plan:  43 y.o. G1P1001 with history as above.  Ultrasound suggest 2 small endometriomas on the left ovary.  We discussed the differential to include adhesions, endometriosis, other ovarian pathology, non-gynecologic etiology and no pathology found.  She also understands there is a risk that she will have persistent pain following the procedure.  Risk of leaving her right ovary with development of  right-sided pain or recurrent pain also discussed.  The risks of removing both ovaries and lack of estrogen with long-term issues and HRT discussed.  At this point the patient clearly wants me to leave her right ovary but she does give me permission to remove the right ovary along with the left ovary if it is my best intraoperative decision to do so and she would consider HRT afterwards.  She also understands that if there is scarring or other issues that would require a larger incision to remove the left side that she wants me to proceed at the time of surgery to make a larger incision and she would accept the larger incision and a longer recovery time.  The expected intraoperative and postoperative courses as well as the recovery period were reviewed. The risks of infection, prolonged antibiotics, reoperation for abscess or hematoma formation was discussed. The risks of hemorrhage necessitating transfusion and the risks of transfusion reaction, hepatitis, HIV, mad cow disease and other unknown entities was also discussed. Incisional complications to include opening and draining of incisions and closure by secondary intention, dehiscence and long-term issues of keloid/cosmetics and hernia formation were reviewed. The risk of inadvertent injury to internal organs including bowel, bladder, ureters, vessels, nerves either immediately recognized or delay recognized necessitating major exploratory reparative surgeries and future reparative surgeries including bowel resection, ostomy formation, bladder repair, ureteral damage repair was discussed with her.  She understands due to her prior surgery there may be distortion of anatomy and increased risk of inadvertent injury.  The patient's questions were answered to her satisfaction and she is ready to proceed with surgery.  [name] MD, 3:00 PM 10/24/2018

## 2018-10-24 NOTE — Op Note (Signed)
DATE OF OPERATION: 10/24/2018  LOCATION: [facility]  PREOPERATIVE DIAGNOSIS: right inner thigh changing skin lesion / sebaceous cyst  POSTOPERATIVE DIAGNOSIS: Same  PROCEDURE: Excision of right inner thigh changing skin lesion / sebaceous cyst 1.5 x 2 cm   SURGEON: Claire Sanger Dillingham, DO  ASSISTANT: Bonita Cox, RNFA  EBL: none  CONDITION: Stable  COMPLICATIONS: None  INDICATION: The patient, Ana Rose, is a 43 y.o. female born on 10/05/1975, is here for treatment of a right inner thigh changing skin lesion.   PROCEDURE DETAILS:  The patient was seen prior to surgery and marked.  The IV antibiotics were given. The patient was taken to the operating room and given a general anesthetic. A standard time out was performed and all information was confirmed by those in the room.  The inner right thigh was prepped and draped.  Local was injected for intraoperative hemostasis and postop pain control.   The #15 blade was used to make an incision around the lesion and excise the 1.5 x 2 cm lesion completely.  The 4-0 and 5-0 Monocryl was used to close the incision.  Derma bond was applied.  The patient was allowed to wake up and taken to recovery room in stable condition at the end of the case. The family was notified at the end of the case.   The RNFA assisted throughout the case.  The RNFA was essential in retraction and counter traction when needed to make the case progress smoothly.  This retraction and assistance made it possible to see the tissue plans for the procedure.  The assistance was needed for blood control, tissue re-approximation and assisted with closure of the incision site.

## 2018-10-25 ENCOUNTER — Other Ambulatory Visit: Payer: Self-pay

## 2018-10-25 ENCOUNTER — Encounter (HOSPITAL_BASED_OUTPATIENT_CLINIC_OR_DEPARTMENT_OTHER): Payer: Self-pay | Admitting: Plastic Surgery

## 2018-10-25 NOTE — Progress Notes (Signed)
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

## 2018-10-26 ENCOUNTER — Encounter (HOSPITAL_COMMUNITY)
Admission: RE | Admit: 2018-10-26 | Discharge: 2018-10-26 | Disposition: A | Discharge status: Home / Self Care | Payer: BC Managed Care – PPO | Source: Ambulatory Visit | Attending: Gynecology | Admitting: Gynecology

## 2018-10-26 ENCOUNTER — Other Ambulatory Visit (HOSPITAL_COMMUNITY)
Admission: RE | Admit: 2018-10-26 | Discharge: 2018-10-26 | Disposition: A | Discharge status: Home / Self Care | Payer: BC Managed Care – PPO | Source: Ambulatory Visit | Attending: Gynecology | Admitting: Gynecology

## 2018-10-26 DIAGNOSIS — Z01812 Encounter for preprocedural laboratory examination: Secondary | ICD-10-CM | POA: Insufficient documentation

## 2018-10-26 DIAGNOSIS — Z1159 Encounter for screening for other viral diseases: Secondary | ICD-10-CM | POA: Insufficient documentation

## 2018-10-26 LAB — CBC
HCT: 32.1 % — ABNORMAL LOW (ref 36.0–46.0)
Hemoglobin: 9.9 g/dL — ABNORMAL LOW (ref 12.0–15.0)
MCH: 31.6 pg (ref 26.0–34.0)
MCHC: 30.8 g/dL (ref 30.0–36.0)
MCV: 102.6 fL — ABNORMAL HIGH (ref 80.0–100.0)
Platelets: 360 10*3/uL (ref 150–400)
RBC: 3.13 MIL/uL — ABNORMAL LOW (ref 3.87–5.11)
RDW: 15 % (ref 11.5–15.5)
WBC: 6.8 10*3/uL (ref 4.0–10.5)
nRBC: 0 % (ref 0.0–0.2)

## 2018-10-26 LAB — ABO/RH: ABO/RH(D): O POS

## 2018-10-27 LAB — NOVEL CORONAVIRUS, NAA (HOSP ORDER, SEND-OUT TO REF LAB; TAT 18-24 HRS): SARS-CoV-2, NAA: NOT DETECTED

## 2018-10-27 NOTE — Progress Notes (Signed)
Attempted to make covid screening call. Person who answered phone states she is not home and went to pick up her daughter.

## 2018-10-27 NOTE — Progress Notes (Signed)
SPOKE W/  _pt     SCREENING SYMPTOMS OF COVID 19:   COUGH--no  RUNNY NOSE---no   SORE THROAT---no  NASAL CONGESTION---no-  SNEEZINGno----  SHORTNESS OF BREATH---no  DIFFICULTY BREATHING---no  TEMP >100.0 ----no-  UNEXPLAINED BODY ACHES------no  CHILLS ------no--   HEADACHES ----no-----  LOSS OF SMELL/ TASTE -------- no   HAVE YOU OR ANY FAMILY MEMBER TRAVELLED PAST 14 DAYS OUT OF THE no  COUNTY-no-- STATE---no- COUNTRY---no-  HAVE YOU OR ANY FAMILY MEMBER BEEN EXPOSED TO ANYONE WITH COVID 19?  no

## 2018-10-30 ENCOUNTER — Ambulatory Visit (HOSPITAL_BASED_OUTPATIENT_CLINIC_OR_DEPARTMENT_OTHER)
Admission: RE | Admit: 2018-10-30 | Discharge: 2018-10-30 | Disposition: A | Discharge status: Home / Self Care | Payer: BC Managed Care – PPO | Attending: Gynecology | Admitting: Gynecology

## 2018-10-30 ENCOUNTER — Ambulatory Visit (HOSPITAL_BASED_OUTPATIENT_CLINIC_OR_DEPARTMENT_OTHER): Payer: BC Managed Care – PPO | Admitting: Anesthesiology

## 2018-10-30 ENCOUNTER — Encounter (HOSPITAL_BASED_OUTPATIENT_CLINIC_OR_DEPARTMENT_OTHER)
Admission: RE | Disposition: A | Discharge status: Home / Self Care | Payer: Self-pay | Source: Home / Self Care | Attending: Gynecology

## 2018-10-30 ENCOUNTER — Encounter (HOSPITAL_BASED_OUTPATIENT_CLINIC_OR_DEPARTMENT_OTHER): Payer: Self-pay

## 2018-10-30 DIAGNOSIS — K449 Diaphragmatic hernia without obstruction or gangrene: Secondary | ICD-10-CM | POA: Insufficient documentation

## 2018-10-30 DIAGNOSIS — Z885 Allergy status to narcotic agent status: Secondary | ICD-10-CM | POA: Diagnosis not present

## 2018-10-30 DIAGNOSIS — F319 Bipolar disorder, unspecified: Secondary | ICD-10-CM | POA: Insufficient documentation

## 2018-10-30 DIAGNOSIS — N809 Endometriosis, unspecified: Secondary | ICD-10-CM

## 2018-10-30 DIAGNOSIS — N838 Other noninflammatory disorders of ovary, fallopian tube and broad ligament: Secondary | ICD-10-CM | POA: Insufficient documentation

## 2018-10-30 DIAGNOSIS — Z886 Allergy status to analgesic agent status: Secondary | ICD-10-CM | POA: Diagnosis not present

## 2018-10-30 DIAGNOSIS — Z87891 Personal history of nicotine dependence: Secondary | ICD-10-CM | POA: Insufficient documentation

## 2018-10-30 DIAGNOSIS — G40909 Epilepsy, unspecified, not intractable, without status epilepticus: Secondary | ICD-10-CM | POA: Insufficient documentation

## 2018-10-30 DIAGNOSIS — N83202 Unspecified ovarian cyst, left side: Secondary | ICD-10-CM | POA: Insufficient documentation

## 2018-10-30 DIAGNOSIS — Z8742 Personal history of other diseases of the female genital tract: Secondary | ICD-10-CM

## 2018-10-30 DIAGNOSIS — Z9884 Bariatric surgery status: Secondary | ICD-10-CM | POA: Diagnosis not present

## 2018-10-30 DIAGNOSIS — R102 Pelvic and perineal pain: Secondary | ICD-10-CM | POA: Diagnosis present

## 2018-10-30 DIAGNOSIS — N839 Noninflammatory disorder of ovary, fallopian tube and broad ligament, unspecified: Secondary | ICD-10-CM

## 2018-10-30 DIAGNOSIS — N803 Endometriosis of pelvic peritoneum: Secondary | ICD-10-CM | POA: Diagnosis not present

## 2018-10-30 HISTORY — PX: LAPAROSCOPIC UNILATERAL SALPINGO OOPHERECTOMY: SHX5935

## 2018-10-30 HISTORY — PX: LAPAROSCOPIC UNILATERAL SALPINGECTOMY: SHX5934

## 2018-10-30 LAB — TYPE AND SCREEN
ABO/RH(D): O POS
Antibody Screen: NEGATIVE

## 2018-10-30 SURGERY — SALPINGO-OOPHORECTOMY, UNILATERAL, LAPAROSCOPIC
Anesthesia: General | Laterality: Right

## 2018-10-30 MED ORDER — MIDAZOLAM HCL 2 MG/2ML IJ SOLN
INTRAMUSCULAR | Status: AC
  Filled 2018-10-30: qty 2

## 2018-10-30 MED ORDER — FENTANYL CITRATE (PF) 100 MCG/2ML IJ SOLN
INTRAMUSCULAR | Status: AC
  Filled 2018-10-30: qty 2

## 2018-10-30 MED ORDER — KETOROLAC TROMETHAMINE 30 MG/ML IJ SOLN
INTRAMUSCULAR | Status: AC
  Filled 2018-10-30: qty 1

## 2018-10-30 MED ORDER — FENTANYL CITRATE (PF) 250 MCG/5ML IJ SOLN
INTRAMUSCULAR | Status: AC
  Filled 2018-10-30: qty 5

## 2018-10-30 MED ORDER — BUPIVACAINE HCL 0.25 % IJ SOLN
INTRAMUSCULAR | Status: DC | PRN
  Administered 2018-10-30: 10 mL

## 2018-10-30 MED ORDER — SUGAMMADEX SODIUM 200 MG/2ML IV SOLN
INTRAVENOUS | Status: DC | PRN
  Administered 2018-10-30: 200 mg via INTRAVENOUS

## 2018-10-30 MED ORDER — ACETAMINOPHEN 10 MG/ML IV SOLN
1000.0000 mg | Freq: Once | INTRAVENOUS | Status: DC | PRN
  Filled 2018-10-30: qty 100

## 2018-10-30 MED ORDER — OXYCODONE HCL 5 MG/5ML PO SOLN
5.0000 mg | Freq: Once | ORAL | Status: DC | PRN
  Filled 2018-10-30: qty 5

## 2018-10-30 MED ORDER — SODIUM CHLORIDE 0.9 % IV SOLN
2.0000 g | INTRAVENOUS | Status: AC
  Administered 2018-10-30: 2 g via INTRAVENOUS
  Filled 2018-10-30: qty 2

## 2018-10-30 MED ORDER — ACETAMINOPHEN 325 MG PO TABS
325.0000 mg | ORAL_TABLET | ORAL | Status: DC | PRN
  Filled 2018-10-30: qty 2

## 2018-10-30 MED ORDER — LIDOCAINE 2% (20 MG/ML) 5 ML SYRINGE
INTRAMUSCULAR | Status: DC | PRN
  Administered 2018-10-30: 100 mg via INTRAVENOUS

## 2018-10-30 MED ORDER — ROCURONIUM BROMIDE 10 MG/ML (PF) SYRINGE
PREFILLED_SYRINGE | INTRAVENOUS | Status: AC
  Filled 2018-10-30: qty 10

## 2018-10-30 MED ORDER — LACTATED RINGERS IV SOLN
INTRAVENOUS | Status: DC
  Administered 2018-10-30: 07:00:00 via INTRAVENOUS
  Filled 2018-10-30: qty 1000

## 2018-10-30 MED ORDER — ACETAMINOPHEN 10 MG/ML IV SOLN
INTRAVENOUS | Status: DC | PRN
  Administered 2018-10-30: 1000 mg via INTRAVENOUS

## 2018-10-30 MED ORDER — OXYCODONE HCL 5 MG PO TABS
5.0000 mg | ORAL_TABLET | Freq: Once | ORAL | Status: DC | PRN
  Filled 2018-10-30: qty 1

## 2018-10-30 MED ORDER — DEXAMETHASONE SODIUM PHOSPHATE 10 MG/ML IJ SOLN
INTRAMUSCULAR | Status: DC | PRN
  Administered 2018-10-30: 10 mg via INTRAVENOUS

## 2018-10-30 MED ORDER — MIDAZOLAM HCL 2 MG/2ML IJ SOLN
INTRAMUSCULAR | Status: DC | PRN
  Administered 2018-10-30: 2 mg via INTRAVENOUS

## 2018-10-30 MED ORDER — KETOROLAC TROMETHAMINE 30 MG/ML IJ SOLN
INTRAMUSCULAR | Status: DC | PRN
  Administered 2018-10-30: 30 mg via INTRAVENOUS

## 2018-10-30 MED ORDER — PROMETHAZINE HCL 25 MG/ML IJ SOLN
6.2500 mg | INTRAMUSCULAR | Status: DC | PRN
  Filled 2018-10-30: qty 1

## 2018-10-30 MED ORDER — ONDANSETRON HCL 4 MG/2ML IJ SOLN
INTRAMUSCULAR | Status: AC
  Filled 2018-10-30: qty 2

## 2018-10-30 MED ORDER — ARTIFICIAL TEARS OPHTHALMIC OINT
TOPICAL_OINTMENT | OPHTHALMIC | Status: AC
  Filled 2018-10-30: qty 3.5

## 2018-10-30 MED ORDER — ACETAMINOPHEN 160 MG/5ML PO SOLN
325.0000 mg | ORAL | Status: DC | PRN
  Filled 2018-10-30: qty 20.3

## 2018-10-30 MED ORDER — PROPOFOL 10 MG/ML IV BOLUS
INTRAVENOUS | Status: AC
  Filled 2018-10-30: qty 40

## 2018-10-30 MED ORDER — FENTANYL CITRATE (PF) 100 MCG/2ML IJ SOLN
INTRAMUSCULAR | Status: DC | PRN
  Administered 2018-10-30 (×2): 50 ug via INTRAVENOUS

## 2018-10-30 MED ORDER — MEPERIDINE HCL 25 MG/ML IJ SOLN
6.2500 mg | INTRAMUSCULAR | Status: DC | PRN
  Filled 2018-10-30: qty 1

## 2018-10-30 MED ORDER — DEXAMETHASONE SODIUM PHOSPHATE 10 MG/ML IJ SOLN
INTRAMUSCULAR | Status: AC
  Filled 2018-10-30: qty 1

## 2018-10-30 MED ORDER — OXYCODONE-ACETAMINOPHEN 5-325 MG PO TABS: 1.0000 | ORAL_TABLET | ORAL | 0 refills | Status: DC | PRN

## 2018-10-30 MED ORDER — ROCURONIUM BROMIDE 10 MG/ML (PF) SYRINGE
PREFILLED_SYRINGE | INTRAVENOUS | Status: DC | PRN
  Administered 2018-10-30: 50 mg via INTRAVENOUS

## 2018-10-30 MED ORDER — LIDOCAINE 2% (20 MG/ML) 5 ML SYRINGE
INTRAMUSCULAR | Status: AC
  Filled 2018-10-30: qty 10

## 2018-10-30 MED ORDER — PROPOFOL 10 MG/ML IV BOLUS
INTRAVENOUS | Status: DC | PRN
  Administered 2018-10-30: 150 mg via INTRAVENOUS

## 2018-10-30 MED ORDER — SCOPOLAMINE 1 MG/3DAYS TD PT72
MEDICATED_PATCH | TRANSDERMAL | Status: AC
  Filled 2018-10-30: qty 1

## 2018-10-30 MED ORDER — SUGAMMADEX SODIUM 200 MG/2ML IV SOLN
INTRAVENOUS | Status: AC
  Filled 2018-10-30: qty 2

## 2018-10-30 MED ORDER — ONDANSETRON HCL 4 MG/2ML IJ SOLN
INTRAMUSCULAR | Status: DC | PRN
  Administered 2018-10-30: 4 mg via INTRAVENOUS

## 2018-10-30 MED ORDER — SCOPOLAMINE 1 MG/3DAYS TD PT72
MEDICATED_PATCH | TRANSDERMAL | Status: DC | PRN
  Administered 2018-10-30: 1 via TRANSDERMAL

## 2018-10-30 MED ORDER — FENTANYL CITRATE (PF) 100 MCG/2ML IJ SOLN
25.0000 ug | INTRAMUSCULAR | Status: DC | PRN
  Administered 2018-10-30: 09:00:00 25 ug via INTRAVENOUS
  Filled 2018-10-30: qty 1

## 2018-10-30 MED ORDER — SODIUM CHLORIDE 0.9 % IV SOLN
INTRAVENOUS | Status: AC
  Filled 2018-10-30: qty 2

## 2018-10-30 SURGICAL SUPPLY — 50 items
APPLICATOR ARISTA FLEXITIP XL (MISCELLANEOUS) IMPLANT
APPLICATOR COTTON TIP 6IN STRL (MISCELLANEOUS) ×2 IMPLANT
BAG LAPAROSCOPIC 12 15 PORT 16 (BASKET) IMPLANT
BAG RETRIEVAL 12/15 (BASKET)
BARRIER ADHS 3X4 INTERCEED (GAUZE/BANDAGES/DRESSINGS) IMPLANT
CANISTER SUCT 3000ML PPV (MISCELLANEOUS) IMPLANT
CATH ROBINSON RED A/P 16FR (CATHETERS) IMPLANT
CONT SPEC 4OZ CLIKSEAL STRL BL (MISCELLANEOUS) IMPLANT
COVER MAYO STAND STRL (DRAPES) ×3 IMPLANT
COVER WAND RF STERILE (DRAPES) ×2 IMPLANT
DERMABOND ADVANCED (GAUZE/BANDAGES/DRESSINGS) ×1
DERMABOND ADVANCED .7 DNX12 (GAUZE/BANDAGES/DRESSINGS) ×2 IMPLANT
DRSG COVADERM PLUS 2X2 (GAUZE/BANDAGES/DRESSINGS) IMPLANT
DRSG OPSITE POSTOP 3X4 (GAUZE/BANDAGES/DRESSINGS) IMPLANT
DURAPREP 26ML APPLICATOR (WOUND CARE) ×3 IMPLANT
FILTER SMOKE EVAC LAPAROSHD (FILTER) IMPLANT
GAUZE 4X4 16PLY RFD (DISPOSABLE) ×3 IMPLANT
GLOVE BIO SURGEON STRL SZ 6.5 (GLOVE) ×1 IMPLANT
GLOVE BIO SURGEON STRL SZ7.5 (GLOVE) ×6 IMPLANT
GLOVE BIOGEL PI IND STRL 7.0 (GLOVE) IMPLANT
GLOVE BIOGEL PI INDICATOR 7.0 (GLOVE)
GOWN STRL REUS W/TWL LRG LVL3 (GOWN DISPOSABLE) ×2 IMPLANT
GOWN STRL REUS W/TWL XL LVL3 (GOWN DISPOSABLE) ×3 IMPLANT
HEMOSTAT ARISTA ABSORB 3G PWDR (HEMOSTASIS) IMPLANT
IV NS IRRIG 3000ML ARTHROMATIC (IV SOLUTION) ×1 IMPLANT
KIT TURNOVER CYSTO (KITS) ×3 IMPLANT
NDL SAFETY ECLIPSE 18X1.5 (NEEDLE) IMPLANT
NEEDLE HYPO 18GX1.5 SHARP (NEEDLE)
NS IRRIG 500ML POUR BTL (IV SOLUTION) ×3 IMPLANT
PACK LAPAROSCOPY BASIN (CUSTOM PROCEDURE TRAY) ×3 IMPLANT
PAD OB MATERNITY 4.3X12.25 (PERSONAL CARE ITEMS) ×3 IMPLANT
PAD PREP 24X48 CUFFED NSTRL (MISCELLANEOUS) ×3 IMPLANT
SCISSORS LAP 5X35 DISP (ENDOMECHANICALS) IMPLANT
SET IRRIG TUBING LAPAROSCOPIC (IRRIGATION / IRRIGATOR) ×3 IMPLANT
SHEARS HARMONIC ACE PLUS 36CM (ENDOMECHANICALS) ×3 IMPLANT
SOLUTION ELECTROLUBE (MISCELLANEOUS) ×2 IMPLANT
SUT MNCRL AB 4-0 PS2 18 (SUTURE) ×6 IMPLANT
SUT VICRYL 0 UR6 27IN ABS (SUTURE) ×3 IMPLANT
SYR 3ML 23GX1 SAFETY (SYRINGE) IMPLANT
SYR 50ML LL SCALE MARK (SYRINGE) IMPLANT
SYS BAG RETRIEVAL 10MM (BASKET) ×3
SYSTEM BAG RETRIEVAL 10MM (BASKET) IMPLANT
TOWEL OR 17X26 10 PK STRL BLUE (TOWEL DISPOSABLE) ×6 IMPLANT
TRAY FOLEY W/BAG SLVR 14FR (SET/KITS/TRAYS/PACK) ×1 IMPLANT
TROCAR BLADELESS OPT 12M 100M (ENDOMECHANICALS) IMPLANT
TROCAR BLADELESS OPT 5 100 (ENDOMECHANICALS) ×6 IMPLANT
TROCAR XCEL NON-BLD 11X100MML (ENDOMECHANICALS) ×3 IMPLANT
TUBING EVAC SMOKE HEATED PNEUM (TUBING) ×3 IMPLANT
WARMER LAPAROSCOPE (MISCELLANEOUS) ×3 IMPLANT
WATER STERILE IRR 500ML POUR (IV SOLUTION) IMPLANT

## 2018-10-30 NOTE — Anesthesia Procedure Notes (Signed)
Performed by: Lyn Hollingshead, MD

## 2018-10-30 NOTE — Addendum Note (Signed)
Addendum  created 10/30/18 1007 by Mechele Claude, CRNA   Intraprocedure Meds edited

## 2018-10-30 NOTE — Op Note (Addendum)
Ana Rose [birth year] [id number]   Post Operative Note   Date of surgery:  10/30/2018  Pre Op Dx: Pelvic pain, left ovarian cyst, history of endometriosis  Post Op Dx: Pelvic pain, left ovarian cyst, endometriosis  Procedure: Laparoscopic left salpingo-oophorectomy, right salpingectomy, fulguration of endometriosis  Surgeon:  [name]  Assistant:  Princess Bruins  Anesthesia:  General  EBL: 5 cc  Complications:  None  Specimen: Left ovary with fallopian tube, right fallopian tube to pathology  Findings: EUA: External BUS vagina normal.  Bimanual without masses   Operative: Anterior cul-de-sac normal.  Posterior cul-de-sac normal.  Left ovary with cystic changes.  Left fallopian tube grossly normal.  Right ovary normal.  Right fallopian tube with classic powder burn endometriotic lesion.  Right pelvic sidewall superior to the ureter with small endometriotic implant, fulgurated.  Upper abdominal exam shows liver smooth without abnormalities.  No significant upper abdominal adhesions or other pathology.  Appendix and gallbladder not visualized.  Procedure: Prior to surgery the patient was examined and we rediscussed the proposed surgery to include left salpingo-oophorectomy and right salpingectomy.  The patient marked her left side as we were both in agreement.  The patient was then taken to the operating room, underwent general anesthesia, received an abdominal and vaginal preparation per nursing personnel and the bladder emptied with an indwelling Foley catheter.  The timeout was performed by the surgical team .  The patient was draped in usual fashion.  EUA was performed and a sponge stick was placed within the vagina for manipulation during the procedure.  The patient's stomach was emptied with an orogastric tube.  A left upper quadrant entry was planned due to her prior laparoscopic surgery.  At the midclavicular line 2 to 3 fingerbreadths below the costal margin a small  incision was made and under direct visualization the 5 mm laparoscopic trocar was placed without difficulty and the abdomen was subsequently insufflated.  Inspection of the abdomen showed no significant adhesions.  A transverse infraumbilical incision through her prior scar was made and and the 10 mm trocar was placed without difficulty.  A right 5 mm suprapubic port was then placed after transillumination for the vessels under direct visualization without difficulty.  Examination the pelvic organs and upper abdominal exam was carried out with findings noted above.  The left adnexa was elevated, the ureter identified away from the surgical site and the infundibular ligament and vessels was transected using the harmonic scalpel without difficulty.  The remaining peritoneal attachments were transected with the harmonic scalpel and the specimen was placed in the posterior cul-de-sac.  The right fallopian tube was elevated, the ureter identified away from the surgical site and the mesosalpinx was transected with the harmonic scalpel with care to avoid the infundibulopelvic vasculature.  A 10 mm bag was placed through the infraumbilical port and using the 5 mm laparoscope through the left suprapubic port the specimens were placed within the pouch and removed through the infraumbilical site.  The 10 mm laparoscope was then replaced the pelvis reinspected noting adequate hemostasis at all incision sites.  A small right pelvic sidewall endometriotic implant was elevated and fulgurated without difficulty.  The 5 mm ports were then removed under direct visualization showing adequate hemostasis and no evidence of hernia formation.  The 10 mm port was then backed out under direct visualization showing adequate hemostasis and no evidence of hernia formation.  The skin incisions were all injected using 0.25% Marcaine and the infraumbilical fascia was  reapproximated using 0 Vicryl suture.  All skin incisions were closed using 4-0  Monocryl in interrupted cutaneous stitch for the 5 mm ports and in a running subcuticular stitch for the infraumbilical port.  Dermabond skin adhesive was applied to all skin incisions.  The patient received intraoperative Toradol.  The sponge stick was removed from the vagina.  The sponge, needle and instrument count were verified correct and the patient was wanded per protocol.  Patient was awakened without difficulty and taken to recovery room in good condition having tolerated procedure well.     [name] MD, 8:36 AM 10/30/2018

## 2018-10-30 NOTE — Anesthesia Preprocedure Evaluation (Signed)
Anesthesia Evaluation  Patient identified by MRN, date of birth, ID band Patient awake    Reviewed: Allergy & Precautions, NPO status , Patient's Chart, lab work & pertinent test results  History of Anesthesia Complications (+) PONV and history of anesthetic complications  Airway Mallampati: I       Dental no notable dental hx. (+) Teeth Intact   Pulmonary former smoker,    Pulmonary exam normal breath sounds clear to auscultation       Cardiovascular Normal cardiovascular exam Rhythm:Regular Rate:Normal     Neuro/Psych Seizures -, Well Controlled,  PSYCHIATRIC DISORDERS Anxiety Depression Bipolar Disorder    GI/Hepatic Neg liver ROS,   Endo/Other  Hypothyroidism   Renal/GU negative Renal ROS  negative genitourinary   Musculoskeletal negative musculoskeletal ROS (+)   Abdominal Normal abdominal exam  (+)   Peds  Hematology  (+) Blood dyscrasia, anemia ,   Anesthesia Other Findings   Reproductive/Obstetrics                             Anesthesia Physical Anesthesia Plan  ASA: II  Anesthesia Plan: General   Post-op Pain Management:    Induction: Intravenous  PONV Risk Score and Plan: 4 or greater and Ondansetron, Dexamethasone, Scopolamine patch - Pre-op and Midazolam  Airway Management Planned: Oral ETT  Additional Equipment:   Intra-op Plan:   Post-operative Plan: Extubation in OR  Informed Consent: I have reviewed the patients History and Physical, chart, labs and discussed the procedure including the risks, benefits and alternatives for the proposed anesthesia with the patient or authorized representative who has indicated his/her understanding and acceptance.     Dental advisory given  Plan Discussed with: CRNA  Anesthesia Plan Comments:         Anesthesia Quick Evaluation

## 2018-10-30 NOTE — Discharge Instructions (Signed)
Postoperative Instructions Laparoscopy  Dr. Phineas Real and the nursing staff have discussed postoperative instructions with you.  If you have any questions please ask them before you leave the hospital, or call Dr Elisabeth Most office at [phone].    We would like to emphasize the following instructions:   ? Call the office to make your follow-up appointment as recommended by Dr Phineas Real (usually 1-2 weeks).  ? You were given a prescription, or one was ordered for you at the pharmacy you designated.  Get that prescription filled and take the medication according to instructions.  ? You may eat a regular diet, but slowly until you start having bowel movements.  ? Drink plenty of water daily.  ? Nothing in the vagina (intercourse, douching, objects of any kind) for 2 weeks.  When reinitiating intercourse, if it is uncomfortable, stop and make an appointment with Dr Phineas Real to be evaluated.  ? No driving for several days until the anesthesia has worn off and you are not having significant pain.  Car rides (short) are ok, as long as you are not having significant pain, but no traveling out of town until your postoperative appointment.  ? You may shower, but no baths for two weeks.  Walking up and down stairs is ok.  No heavy lifting, prolonged standing, repeated bending or any working out until your first  postoperative appointment.  ? Rest frequently, listen to your body and do not push yourself and overdo it.  ? Call if:  o Your pain medication does not seem strong enough. o Worsening pain or abdominal bloating o Persistent nausea or vomiting o Difficulty with urination or bowel movements. o Temperature of 101 degrees or higher. o Heavy vaginal bleeding.  If your period is due, you may use tampons.   o Incisions become red, tender or begin to drain. o You have any questions or concerns   Post Anesthesia Home Care Instructions  Activity: Get plenty of rest for the remainder of  the day. A responsible individual must stay with you for 24 hours following the procedure.  For the next 24 hours, DO NOT: -Drive a car -Paediatric nurse -Drink alcoholic beverages -Take any medication unless instructed by your physician -Make any legal decisions or sign important papers.  Meals: Start with liquid foods such as gelatin or soup. Progress to regular foods as tolerated. Avoid greasy, spicy, heavy foods. If nausea and/or vomiting occur, drink only clear liquids until the nausea and/or vomiting subsides. Call your physician if vomiting continues.  Special Instructions/Symptoms: Your throat may feel dry or sore from the anesthesia or the breathing tube placed in your throat during surgery. If this causes discomfort, gargle with warm salt water. The discomfort should disappear within 24 hours.  If you had a scopolamine patch placed behind your ear for the management of post- operative nausea and/or vomiting:  1. The medication in the patch is effective for 72 hours, after which it should be removed.  Wrap patch in a tissue and discard in the trash. Wash hands thoroughly with soap and water. 2. You may remove the patch earlier than 72 hours if you experience unpleasant side effects which may include dry mouth, dizziness or visual disturbances. 3. Avoid touching the patch. Wash your hands with soap and water after contact with the patch.

## 2018-10-30 NOTE — Transfer of Care (Signed)
Last Vitals:  Vitals Value Taken Time  BP 119/77 10/30/2018  8:31 AM  Temp 36.5 C 10/30/2018  8:30 AM  Pulse 79 10/30/2018  8:34 AM  Resp 19 10/30/2018  8:34 AM  SpO2 100 % 10/30/2018  8:34 AM  Vitals shown include unvalidated device data.  Last Pain:  Vitals:   10/30/18 0601  TempSrc: Oral  PainSc: 0-No pain      Patients Stated Pain Goal: 3 (10/30/18 0601) Immediate Anesthesia Transfer of Care Note  Patient: Ana Rose  Procedure(s) Performed: Procedure(s) (LRB): LAPAROSCOPIC UNILATERAL SALPINGO OOPHORECTOMY (Left) LAPAROSCOPIC UNILATERAL SALPINGECTOMY (Right)  Patient Location: PACU  Anesthesia Type: General  Level of Consciousness: awake, alert  and oriented  Airway & Oxygen Therapy: Patient Spontanous Breathing and Patient connected to nasal cannula oxygen  Post-op Assessment: Report given to PACU RN and Post -op Vital signs reviewed and stable  Post vital signs: Reviewed and stable  Complications: No apparent anesthesia complications

## 2018-10-30 NOTE — H&P (Signed)
The patient was examined.  I reviewed the proposed surgery and consent form with the patient.  The dictated history and physical is current and accurate and all questions were answered. The patient is ready to proceed with surgery and has a realistic understanding and expectation for the outcome.   [name] MD, 7:09 AM 10/30/2018

## 2018-10-30 NOTE — Anesthesia Postprocedure Evaluation (Signed)
Anesthesia Post Note  Patient: Ana Rose  Procedure(s) Performed: LAPAROSCOPIC UNILATERAL SALPINGO OOPHORECTOMY (Left ) LAPAROSCOPIC UNILATERAL SALPINGECTOMY (Right )     Patient location during evaluation: PACU Anesthesia Type: General Level of consciousness: sedated Pain management: pain level controlled Vital Signs Assessment: post-procedure vital signs reviewed and stable Respiratory status: spontaneous breathing Cardiovascular status: stable Postop Assessment: no apparent nausea or vomiting Anesthetic complications: no    Last Vitals:  Vitals:   10/30/18 0900 10/30/18 0915  BP: 115/78 117/74  Pulse: 76 73  Resp: 10 10  Temp:    SpO2: 99% 100%    Last Pain:  Vitals:   10/30/18 0915  TempSrc:   PainSc: Asleep   Pain Goal: Patients Stated Pain Goal: 3 (10/30/18 0601)                 Huston Foley

## 2018-10-30 NOTE — Anesthesia Procedure Notes (Addendum)
Procedure Name: Intubation Date/Time: 10/30/2018 7:30 AM Performed by: Lyn Hollingshead, MD Pre-anesthesia Checklist: Patient identified, Emergency Drugs available, Suction available and Patient being monitored Patient Re-evaluated:Patient Re-evaluated prior to induction Oxygen Delivery Method: Circle system utilized Preoxygenation: Pre-oxygenation with 100% oxygen Induction Type: IV induction Ventilation: Mask ventilation without difficulty Laryngoscope Size: Mac and 3 Grade View: Grade I Tube type: Oral Tube size: 7.0 mm Number of attempts: 1 Airway Equipment and Method: Stylet Placement Confirmation: ETT inserted through vocal cords under direct vision,  positive ETCO2 and breath sounds checked- equal and bilateral Secured at: 21 cm Tube secured with: Tape Dental Injury: Teeth and Oropharynx as per pre-operative assessment

## 2018-10-31 ENCOUNTER — Encounter (HOSPITAL_BASED_OUTPATIENT_CLINIC_OR_DEPARTMENT_OTHER): Payer: Self-pay | Admitting: Gynecology

## 2018-10-31 ENCOUNTER — Encounter: Payer: Self-pay | Admitting: Plastic Surgery

## 2018-11-07 ENCOUNTER — Encounter: Payer: BLUE CROSS/BLUE SHIELD | Admitting: Plastic Surgery

## 2018-11-14 ENCOUNTER — Encounter: Payer: BC Managed Care – PPO | Admitting: Plastic Surgery

## 2018-11-16 ENCOUNTER — Other Ambulatory Visit: Payer: Self-pay

## 2018-11-17 ENCOUNTER — Ambulatory Visit (INDEPENDENT_AMBULATORY_CARE_PROVIDER_SITE_OTHER): Payer: BC Managed Care – PPO | Admitting: Gynecology

## 2018-11-17 ENCOUNTER — Encounter: Payer: Self-pay | Admitting: Gynecology

## 2018-11-17 VITALS — BP 140/84

## 2018-11-17 DIAGNOSIS — D649 Anemia, unspecified: Secondary | ICD-10-CM

## 2018-11-17 DIAGNOSIS — Z4889 Encounter for other specified surgical aftercare: Secondary | ICD-10-CM

## 2018-11-17 NOTE — Patient Instructions (Signed)
Office will call with blood test results

## 2018-11-17 NOTE — Progress Notes (Signed)
    Ana Rose [birth year] [id number]        43 y.o.  G1P1001 tenths for her postop visit status post laparoscopic left salpingo-oophorectomy, right salpingectomy, fulguration of endometriosis.  Has done well since surgery.  Also has had significant anemia with hemoglobin of 9 preop for unexplained reason noting she is status post hysterectomy in the past  Past medical history,surgical history, problem list, medications, allergies, family history and social history were all reviewed and documented in the EPIC chart.  Directed ROS with pertinent positives and negatives documented in the history of present illness/assessment and plan.  Exam: Ana Rose assistant Vitals:   11/17/18 1027  BP: 140/84   General appearance:  Normal Abdomen soft nontender without masses guarding rebound.  Abdominal incisions healed nicely.  2 small sutures sticking out from the umbilical and left suprapubic ports.  Both were removed without difficulty.  Assessment/Plan:  43 y.o. G1P1001 results of surgery reviewed with the patient.  She is doing well postoperative.  Will initiate anemia work-up with iron TIBC and ferritin panel as well as B12 and folate.  Will consider referral to hematology if significant anemia persists unexplained.  Otherwise patient will follow-up when she is due for her annual exam.    [name] MD, 11:05 AM 11/17/2018

## 2018-11-18 LAB — CBC WITH DIFFERENTIAL/PLATELET
Absolute Monocytes: 577 cells/uL (ref 200–950)
Basophils Absolute: 37 cells/uL (ref 0–200)
Basophils Relative: 0.5 %
Eosinophils Absolute: 117 cells/uL (ref 15–500)
Eosinophils Relative: 1.6 %
HCT: 30.8 % — ABNORMAL LOW (ref 35.0–45.0)
Hemoglobin: 10.2 g/dL — ABNORMAL LOW (ref 11.7–15.5)
Lymphs Abs: 1942 cells/uL (ref 850–3900)
MCH: 30.3 pg (ref 27.0–33.0)
MCHC: 33.1 g/dL (ref 32.0–36.0)
MCV: 91.4 fL (ref 80.0–100.0)
MPV: 10.4 fL (ref 7.5–12.5)
Monocytes Relative: 7.9 %
Neutro Abs: 4628 cells/uL (ref 1500–7800)
Neutrophils Relative %: 63.4 %
Platelets: 351 10*3/uL (ref 140–400)
RBC: 3.37 10*6/uL — ABNORMAL LOW (ref 3.80–5.10)
RDW: 14.9 % (ref 11.0–15.0)
Total Lymphocyte: 26.6 %
WBC: 7.3 10*3/uL (ref 3.8–10.8)

## 2018-11-18 LAB — B12 AND FOLATE PANEL
Folate: 7.5 ng/mL
Vitamin B-12: 310 pg/mL (ref 200–1100)

## 2018-11-18 LAB — IRON,TIBC AND FERRITIN PANEL
%SAT: 10 % (calc) — ABNORMAL LOW (ref 16–45)
Ferritin: 8 ng/mL — ABNORMAL LOW (ref 16–232)
Iron: 39 ug/dL — ABNORMAL LOW (ref 40–190)
TIBC: 408 mcg/dL (calc) (ref 250–450)

## 2018-11-20 ENCOUNTER — Other Ambulatory Visit: Payer: Self-pay | Admitting: Gynecology

## 2018-11-20 ENCOUNTER — Encounter: Payer: Self-pay | Admitting: Gynecology

## 2018-11-20 DIAGNOSIS — D508 Other iron deficiency anemias: Secondary | ICD-10-CM

## 2018-11-21 ENCOUNTER — Telehealth: Payer: Self-pay | Admitting: Plastic Surgery

## 2018-11-21 NOTE — Telephone Encounter (Signed)

## 2018-11-22 ENCOUNTER — Encounter: Payer: Self-pay | Admitting: Plastic Surgery

## 2018-11-22 ENCOUNTER — Ambulatory Visit (INDEPENDENT_AMBULATORY_CARE_PROVIDER_SITE_OTHER): Payer: BC Managed Care – PPO | Admitting: Plastic Surgery

## 2018-11-22 ENCOUNTER — Other Ambulatory Visit: Payer: Self-pay

## 2018-11-22 VITALS — BP 124/81 | HR 123 | Temp 98.2°F | Ht 66.0 in | Wt 145.0 lb

## 2018-11-22 DIAGNOSIS — L723 Sebaceous cyst: Secondary | ICD-10-CM

## 2018-11-22 NOTE — Progress Notes (Signed)
Patient is a 43 year old female here for follow-up after undergoing excision of a right medial thigh sebaceous cyst.  The path was consistent with that diagnosis.  She has completely healed.  There is no sign of infection redness or seroma.  I removed the stitches today.  And placed a Band-Aid.  Continue normal activity as able.  Follow-up as needed.

## 2018-12-11 ENCOUNTER — Encounter: Payer: Self-pay | Admitting: Gynecology

## 2019-02-13 ENCOUNTER — Encounter: Payer: Self-pay | Admitting: Gynecology

## 2019-08-17 ENCOUNTER — Ambulatory Visit: Payer: Self-pay | Attending: Internal Medicine

## 2019-08-17 DIAGNOSIS — Z23 Encounter for immunization: Secondary | ICD-10-CM

## 2019-08-17 NOTE — Progress Notes (Signed)
   Covid-19 Vaccination Clinic  Name:  Ana Rose    MRN: [id number] DOB: [birth year]  08/17/2019  [name] was observed post Covid-19 immunization for 15 minutes without incident. She was provided with Vaccine Information Sheet and instruction to access the V-Safe system.   [name] was instructed to call 911 with any severe reactions post vaccine: Marland Kitchen Difficulty breathing  . Swelling of face and throat  . A fast heartbeat  . A bad rash all over body  . Dizziness and weakness   Immunizations Administered    Name Date Dose VIS Date Route   Pfizer COVID-19 Vaccine 08/17/2019 10:07 AM 0.3 mL 05/04/2019 Intramuscular   Manufacturer: [place]   Lot: [id number]   [place]: [id number]

## 2019-09-11 ENCOUNTER — Ambulatory Visit: Payer: Self-pay | Attending: Internal Medicine

## 2019-09-11 DIAGNOSIS — Z23 Encounter for immunization: Secondary | ICD-10-CM

## 2019-09-11 NOTE — Progress Notes (Signed)
   Covid-19 Vaccination Clinic  Name:  MORIYAH DELIN    MRN: [id number] DOB: December 21, 1975  09/11/2019  Ms. Elliott was observed post Covid-19 immunization for 15 minutes without incident. She was provided with Vaccine Information Sheet and instruction to access the V-Safe system.   Ms. Scallon was instructed to call 911 with any severe reactions post vaccine: Marland Kitchen Difficulty breathing  . Swelling of face and throat  . A fast heartbeat  . A bad rash all over body  . Dizziness and weakness   Immunizations Administered    Name Date Dose VIS Date Route   Pfizer COVID-19 Vaccine 09/11/2019  4:43 PM 0.3 mL 07/18/2018 Intramuscular   Manufacturer: [place]   Lot: [id number]   [place]: [id number]

## 2020-01-22 ENCOUNTER — Encounter: Payer: Self-pay | Admitting: Obstetrics and Gynecology

## 2020-01-31 ENCOUNTER — Telehealth: Payer: Self-pay | Admitting: Hematology and Oncology

## 2020-01-31 NOTE — Telephone Encounter (Signed)
Received anew hem referral from Dr. Radene Ou for anemia w/b12 deficiency. Ms. Hausmann has been cld and scheduled to see Dr. Lorenso Courier on 9/29 at 1pm. Pt aware to arrive 50minutes early.

## 2020-02-20 ENCOUNTER — Inpatient Hospital Stay: Payer: 59

## 2020-02-20 ENCOUNTER — Inpatient Hospital Stay: Payer: 59 | Attending: Hematology and Oncology | Admitting: Hematology and Oncology

## 2020-02-20 ENCOUNTER — Other Ambulatory Visit: Payer: Self-pay

## 2020-02-20 VITALS — BP 144/101 | HR 125 | Temp 96.6°F | Resp 18 | Ht 66.0 in | Wt 162.4 lb

## 2020-02-20 DIAGNOSIS — R5383 Other fatigue: Secondary | ICD-10-CM | POA: Insufficient documentation

## 2020-02-20 DIAGNOSIS — D508 Other iron deficiency anemias: Secondary | ICD-10-CM | POA: Diagnosis not present

## 2020-02-20 DIAGNOSIS — E7800 Pure hypercholesterolemia, unspecified: Secondary | ICD-10-CM | POA: Insufficient documentation

## 2020-02-20 DIAGNOSIS — Z803 Family history of malignant neoplasm of breast: Secondary | ICD-10-CM | POA: Diagnosis not present

## 2020-02-20 DIAGNOSIS — K589 Irritable bowel syndrome without diarrhea: Secondary | ICD-10-CM | POA: Diagnosis not present

## 2020-02-20 DIAGNOSIS — E538 Deficiency of other specified B group vitamins: Secondary | ICD-10-CM

## 2020-02-20 DIAGNOSIS — R233 Spontaneous ecchymoses: Secondary | ICD-10-CM

## 2020-02-20 DIAGNOSIS — Z79899 Other long term (current) drug therapy: Secondary | ICD-10-CM | POA: Insufficient documentation

## 2020-02-20 DIAGNOSIS — F319 Bipolar disorder, unspecified: Secondary | ICD-10-CM | POA: Insufficient documentation

## 2020-02-20 DIAGNOSIS — Z87891 Personal history of nicotine dependence: Secondary | ICD-10-CM | POA: Diagnosis not present

## 2020-02-20 DIAGNOSIS — G40909 Epilepsy, unspecified, not intractable, without status epilepticus: Secondary | ICD-10-CM | POA: Diagnosis not present

## 2020-02-20 DIAGNOSIS — D649 Anemia, unspecified: Secondary | ICD-10-CM | POA: Diagnosis not present

## 2020-02-20 DIAGNOSIS — R238 Other skin changes: Secondary | ICD-10-CM

## 2020-02-20 DIAGNOSIS — Z9884 Bariatric surgery status: Secondary | ICD-10-CM | POA: Diagnosis not present

## 2020-02-20 DIAGNOSIS — K219 Gastro-esophageal reflux disease without esophagitis: Secondary | ICD-10-CM | POA: Diagnosis not present

## 2020-02-20 DIAGNOSIS — E039 Hypothyroidism, unspecified: Secondary | ICD-10-CM | POA: Insufficient documentation

## 2020-02-20 LAB — RETIC PANEL
Immature Retic Fract: 13.7 % (ref 2.3–15.9)
RBC.: 3.86 MIL/uL — ABNORMAL LOW (ref 3.87–5.11)
Retic Count, Absolute: 49.4 10*3/uL (ref 19.0–186.0)
Retic Ct Pct: 1.3 % (ref 0.4–3.1)
Reticulocyte Hemoglobin: 31.7 pg (ref >27.9)

## 2020-02-20 LAB — CBC WITH DIFFERENTIAL (CANCER CENTER ONLY)
Abs Immature Granulocytes: 0.01 10*3/uL (ref 0.00–0.07)
Basophils Absolute: 0.1 10*3/uL (ref 0.0–0.1)
Basophils Relative: 1 %
Eosinophils Absolute: 0 10*3/uL (ref 0.0–0.5)
Eosinophils Relative: 1 %
HCT: 31.9 % — ABNORMAL LOW (ref 36.0–46.0)
Hemoglobin: 10 g/dL — ABNORMAL LOW (ref 12.0–15.0)
Immature Granulocytes: 0 %
Lymphocytes Relative: 20 %
Lymphs Abs: 1.4 10*3/uL (ref 0.7–4.0)
MCH: 26.1 pg (ref 26.0–34.0)
MCHC: 31.3 g/dL (ref 30.0–36.0)
MCV: 83.3 fL (ref 80.0–100.0)
Monocytes Absolute: 0.6 10*3/uL (ref 0.1–1.0)
Monocytes Relative: 8 %
Neutro Abs: 4.8 10*3/uL (ref 1.7–7.7)
Neutrophils Relative %: 70 %
Platelet Count: 340 10*3/uL (ref 150–400)
RBC: 3.83 MIL/uL — ABNORMAL LOW (ref 3.87–5.11)
RDW: 18.9 % — ABNORMAL HIGH (ref 11.5–15.5)
WBC Count: 6.9 10*3/uL (ref 4.0–10.5)
nRBC: 0 % (ref 0.0–0.2)

## 2020-02-20 LAB — CMP (CANCER CENTER ONLY)
ALT: 29 U/L (ref 0–44)
AST: 51 U/L — ABNORMAL HIGH (ref 15–41)
Albumin: 4.3 g/dL (ref 3.5–5.0)
Alkaline Phosphatase: 104 U/L (ref 38–126)
Anion gap: 11 (ref 5–15)
BUN: 7 mg/dL (ref 6–20)
CO2: 25 mmol/L (ref 22–32)
Calcium: 9.6 mg/dL (ref 8.9–10.3)
Chloride: 102 mmol/L (ref 98–111)
Creatinine: 0.85 mg/dL (ref 0.44–1.00)
GFR, Est AFR Am: >60 mL/min (ref >60)
GFR, Estimated: >60 mL/min (ref >60)
Glucose, Bld: 74 mg/dL (ref 70–99)
Potassium: 4.1 mmol/L (ref 3.5–5.1)
Sodium: 138 mmol/L (ref 135–145)
Total Bilirubin: 0.4 mg/dL (ref 0.3–1.2)
Total Protein: 7.5 g/dL (ref 6.5–8.1)

## 2020-02-20 LAB — VITAMIN B12: Vitamin B-12: 172 pg/mL — ABNORMAL LOW (ref 180–914)

## 2020-02-20 LAB — APTT: aPTT: 31 seconds (ref 24–36)

## 2020-02-20 LAB — PROTIME-INR
INR: 1 (ref 0.8–1.2)
Prothrombin Time: 12.8 seconds (ref 11.4–15.2)

## 2020-02-20 LAB — FOLATE: Folate: 7.5 ng/mL (ref >5.9)

## 2020-02-20 LAB — LACTATE DEHYDROGENASE: LDH: 168 U/L (ref 98–192)

## 2020-02-20 NOTE — Progress Notes (Signed)
Ana Rose [phone]   Fax:([phone]) [place] NOTE  Patient Care Team: Aretta Nip, MD as PCP - General (Family Medicine)  Hematological/Oncological History # Vitamin B12 deficiency  1) 11/17/2018: Iron 39, Ferritin 8, Sat 10%, TIBC 408  2) 11/14/2019: Vitamin b12 109, Folate 13.2,  3) 12/03/2019-12/17/2019: received weekly injections of 1000 mcg of Vitamin B12. An additional dose was given on 01/21/2020 4) 01/21/2020: WBC 4.3, Hgb 9.2, MCV 82.6, Plt 350. Vitamin B12 130 (nml 180-914)   5) 02/20/2020: establish care with Dr. Lorenso Courier   CHIEF COMPLAINTS/PURPOSE OF CONSULTATION:  " Vitamin B12 deficiency "  HISTORY OF PRESENTING ILLNESS:  Ana Rose 44 y.o. female with medical history significant for gastric bypass surgery in 2010, bipolar disorder, post partum cardiomyopathy, and iron deficiency anemia who presents for persistently low Vitamin b12 levels.   On review of the previous records Mrs. Bera has a record of anemia dating back to at least January 2020.  Of note on 11/17/2018 the patient was noted to have a ferritin of 8, and an iron sat of 10%.  More recently on 11/14/2019 the patient noted to have a vitamin B12 level of 109 with a folate of 13.2.  From 12/03/2019 until 12/17/2019 the patient received weekly injections of 1000 mcg of vitamin B12 she also received an additional dose on 01/21/2020.  Unfortunately on repeat check on 01/21/2020 the patient had a hemoglobin of 9.2, MCV of 82.6, platelet count of 350, and vitamin B12 persistently low at 130.  Due to concern for the failure of the patient's vitamin B12 to rise she was referred to hematology for further evaluation and management.  On exam today Mrs. Sparano is accompanied by her wife.  Her wife notes that the patient developed unusual lesions on her arms following scratching.  She also notes that when her dog jumped on her very lightly she would develop bruising and purpuric rash  on her arms at that time.  These rashes have subsequently resolved and are not visible today.    On further discussion the patient notes that she has been having issues with fatigue, but not having any trouble with shortness of breath.  She endorses having a Roux-en-Y gastric bypass procedure in 2010 and has "always been slightly iron deficient".  She notes that she had a hemoglobin of 5.5 when she was diagnosed with her postpartum cardiomyopathy.  On further discussion she notes that she has not seen any overt signs of bleeding, bruising, or dark stools other than the lesions noted on her arms previously which are no longer present.  She notes that she eats a normal unrestricted diet consisting of meat, veggies, and does not have any restrictions.  She notes that she was initially told she was hypothyroid, that after the vitamin B12 shots she had normalization of her thyroid hormone levels.  Additionally the patient has no family history markable for blood disorders or blood cancers.  She was a smoker but quit over 20 years ago.  She notes that she has had a fair amount of weight loss from her gastric bypass surgery dropping down from 263 pounds and her max to a minimum of 135.  Other than bruising easily she has no additional concerns or complaints today.  A full 10 point ROS is listed below.  MEDICAL HISTORY:  Past Medical History:  Diagnosis Date  . ADD (attention deficit disorder)   . Anemia   . Anxiety   . Bipolar  disorder ([place])   . CIN I (cervical intraepithelial neoplasia I)   . Depression   . Epileptic seizures ([place])    Last seizure at 44 years of age  . GERD (gastroesophageal reflux disease)   . Heart murmur    funtional  . Hiatal hernia   . History of dysfunctional uterine bleeding   . Hypercholesteremia 08/31/2016   Noted in note  . Hypothyroidism   . Irritable bowel syndrome (IBS)   . PONV (postoperative nausea and vomiting)   . Suicide attempt ([place]) 08/24/2012    SURGICAL  HISTORY: Past Surgical History:  Procedure Laterality Date  . CHOLECYSTECTOMY  2001  . COLONOSCOPY  05/27/2017  . CYST EXCISION Right 10/24/2018   Procedure: Excision of right inner thigh sebaceous cyst;  Surgeon: Wallace Going, DO;  Location: [facility];  Service: Plastics;  Laterality: Right;  . ESOPHAGOGASTRODUODENOSCOPY  05/2012  . GASTRIC BYPASS  2010  . GYNECOLOGIC CRYOSURGERY  Approximately age 24  . LAPAROSCOPIC TOTAL HYSTERECTOMY  7.16.2012   endometriosis on fallopian tubes  . LAPAROSCOPIC UNILATERAL SALPINGECTOMY Right 10/30/2018   Procedure: LAPAROSCOPIC UNILATERAL SALPINGECTOMY;  Surgeon: [name], MD;  Location: [facility];  Service: Gynecology;  Laterality: Right;  . LAPAROSCOPIC UNILATERAL SALPINGO OOPHERECTOMY Left 10/30/2018   Procedure: LAPAROSCOPIC UNILATERAL SALPINGO OOPHORECTOMY;  Surgeon: [name], MD;  Location: [facility];  Service: Gynecology;  Laterality: Left;  request 7:30am OR time in [facility] block requests one hour  . TUBAL LIGATION  2001  . WISDOM TOOTH EXTRACTION      SOCIAL HISTORY: Social History   Socioeconomic History  . Marital status: Married    Spouse name: Not on file  . Number of children: Not on file  . Years of education: Not on file  . Highest education level: Not on file  Occupational History  . Not on file  Tobacco Use  . Smoking status: Former Smoker    Types: Cigarettes    Quit date: 11/30/1998    Years since quitting: 21.2  . Smokeless tobacco: Never Used  Vaping Use  . Vaping Use: Never used  Substance and Sexual Activity  . Alcohol use: Not Currently    Alcohol/week: 0.0 standard drinks  . Drug use: No  . Sexual activity: Yes    Birth control/protection: Surgical    Comment: HYST.-1st intercourse 44 yo-Fewer than 5 partners  Other Topics Concern  . Not on file  Social History Narrative  . Not on file   Social Determinants of Health    Financial Resource Strain:   . Difficulty of Paying Living Expenses: Not on file  Food Insecurity:   . Worried About Charity fundraiser in the Last Year: Not on file  . Ran Out of Food in the Last Year: Not on file  Transportation Needs:   . Lack of Transportation (Medical): Not on file  . Lack of Transportation (Non-Medical): Not on file  Physical Activity:   . Days of Exercise per Week: Not on file  . Minutes of Exercise per Session: Not on file  Stress:   . Feeling of Stress : Not on file  Social Connections:   . Frequency of Communication with Friends and Family: Not on file  . Frequency of Social Gatherings with Friends and Family: Not on file  . Attends Religious Services: Not on file  . Active Member of Clubs or Organizations: Not on file  . Attends Archivist Meetings:  Not on file  . Marital Status: Not on file  Intimate Partner Violence:   . Fear of Current or Ex-Partner: Not on file  . Emotionally Abused: Not on file  . Physically Abused: Not on file  . Sexually Abused: Not on file    FAMILY HISTORY: Family History  Problem Relation Age of Onset  . Hypertension Mother   . Hypertension Father   . Diabetes Paternal Grandmother   . Cancer Paternal Grandmother        OV/UT  . Breast cancer Paternal Grandmother 29  . Breast cancer Cousin 34       MATERNAL  . Alcoholism Paternal Grandfather     ALLERGIES:  is allergic to hydrocodone, lactose intolerance (gi), nsaids, and xanax [alprazolam].  MEDICATIONS:  Current Outpatient Medications  Medication Sig Dispense Refill  . EQUETRO 100 MG CP12 12 hr capsule TAKE ONE CAPSULE BY MOUTH EVERY MORNING AND TAKE 2 CAPSULES BY MOUTH AT BEDTIME    . gabapentin (NEURONTIN) 300 MG capsule Take 1 capsule (300 mg total) by mouth daily. (Patient taking differently: Take 300-600 mg by mouth every 8 (eight) hours as needed (for anxiety). ) 30 capsule 0  . lamoTRIgine (LAMICTAL) 200 MG tablet Take 1 tablet (200 mg total)  by mouth every morning. (Patient taking differently: Take 200 mg by mouth every morning. ) 30 tablet 0  . lisdexamfetamine (VYVANSE) 50 MG capsule Take 1 capsule (50 mg total) by mouth every morning. 1 capsule 0  . lithium carbonate (LITHOBID) 300 MG CR tablet TAKE 1 TABLET BY MOUTH EVERYDAY AT BEDTIME    . Vitamin D, Ergocalciferol, (DRISDOL) 50000 units CAPS capsule Take 50,000 Units by mouth every 7 (seven) days.    Marland Kitchen acetaminophen (TYLENOL) 325 MG tablet Tylenol  prn    . liothyronine (CYTOMEL) 50 MCG tablet     . Melatonin 3 MG TABS Take 3 mg by mouth at bedtime.    . ondansetron (ZOFRAN) 4 MG tablet Take 4 mg by mouth daily.     . pantoprazole (PROTONIX) 40 MG tablet Take 1 tablet (40 mg total) by mouth at bedtime. 30 tablet 0  . topiramate (TOPAMAX) 50 MG tablet Take 100 mg by mouth at bedtime.     No current facility-administered medications for this visit.    REVIEW OF SYSTEMS:   Constitutional: ( - ) fevers, ( - )  chills , ( - ) night sweats Eyes: ( - ) blurriness of vision, ( - ) double vision, ( - ) watery eyes Ears, nose, mouth, throat, and face: ( - ) mucositis, ( - ) sore throat Respiratory: ( - ) cough, ( - ) dyspnea, ( - ) wheezes Cardiovascular: ( - ) palpitation, ( - ) chest discomfort, ( - ) lower extremity swelling Gastrointestinal:  ( - ) nausea, ( - ) heartburn, ( - ) change in bowel habits Skin: ( - ) abnormal skin rashes Lymphatics: ( - ) new lymphadenopathy, ( + ) easy bruising Neurological: ( - ) numbness, ( - ) tingling, ( - ) new weaknesses Behavioral/Psych: ( - ) mood change, ( - ) new changes  All other systems were reviewed with the patient and are negative.  PHYSICAL EXAMINATION:  Vitals:   02/20/20 1320  BP: (!) 144/101  Pulse: (!) 125  Resp: 18  Temp: (!) 96.6 F (35.9 C)  SpO2: 100%   Filed Weights   02/20/20 1320  Weight: 162 lb 6.4 oz (73.7 kg)    GENERAL:  well appearing middle aged Caucasian female in NAD. Nervous appearing.   SKIN:  skin color, texture, turgor are normal, no rashes or significant lesions EYES: conjunctiva are pink and non-injected, sclera clear LUNGS: clear to auscultation and percussion with normal breathing effort HEART: regular rate & rhythm and no murmurs and no lower extremity edema ABDOMEN: soft, non-tender, non-distended, normal bowel sounds. No HSM appreciated.  Musculoskeletal: no cyanosis of digits and no clubbing  PSYCH: alert & oriented x 3, fluent speech NEURO: no focal motor/sensory deficits  LABORATORY DATA:  I have reviewed the data as listed CBC Latest Ref Rng & Units 02/20/2020 11/17/2018 10/26/2018  WBC 4.0 - 10.5 K/uL 6.9 7.3 6.8  Hemoglobin 12.0 - 15.0 g/dL 10.0(L) 10.2(L) 9.9(L)  Hematocrit 36 - 46 % 31.9(L) 30.8(L) 32.1(L)  Platelets 150 - 400 K/uL 340 351 360    CMP Latest Ref Rng & Units 02/20/2020 08/11/2015 08/08/2014  Glucose 70 - 99 mg/dL 74 92 104(H)  BUN 6 - 20 mg/dL 7 7 8   Creatinine 0.44 - 1.00 mg/dL 0.85 0.69 0.69  Sodium 135 - 145 mmol/L 138 138 136  Potassium 3.5 - 5.1 mmol/L 4.1 4.1 4.2  Chloride 98 - 111 mmol/L 102 100 97  CO2 22 - 32 mmol/L 25 26 27   Calcium 8.9 - 10.3 mg/dL 9.6 8.8 9.4  Total Protein 6.5 - 8.1 g/dL 7.5 6.8 7.0  Total Bilirubin 0.3 - 1.2 mg/dL 0.4 0.3 0.4  Alkaline Phos 38 - 126 U/L 104 72 64  AST 15 - 41 U/L 51(H) 26 36  ALT 0 - 44 U/L 29 20 32    RADIOGRAPHIC STUDIES: No results found.  ASSESSMENT & PLAN LIVIER HENDEL 44 y.o. female with medical history significant for gastric bypass surgery in 2010, bipolar disorder, post partum cardiomyopathy, and iron deficiency anemia who presents for persistently low Vitamin b12 levels.  After review the labs, review the records, discussion with the patient the findings are most consistent with a vitamin B12 deficiency causing anemia.  The patient has received 4 doses of 1000 mcg of vitamin B12 which has begun to increase her hemoglobin level, however her vitamin B12 levels remain low.  The most  likely reason for this is that the patient has a marked deficit of vitamin B12 and requires high doses of supplementation in order to meet her body's needs and begin to build up reserve stores.  I would recommend continued vitamin B12 supplementation and today we will check her MMA and homocystine to assure that this is appropriately being metabolized by her body.  She may also have other nutritional deficiencies as a result of her gastric bypass surgery.  As such we will check her iron levels and copper levels.  Our plan for having the patient return to clinic will be pending the results of the labs below.  # Vitamin B12 deficiency  #Normocytic Anemia # Iron Deficiency Anemia #s/p Gastric Bypass in 2010 --findings are most consistent with nutritional deficiencies 2/2 to her gastric bypass.  --the patent has had a modest bump in Hgb with continued poor vitamin b12 levels. The findings are most consistent with rapid use of the Vitamin b12 by the tissues of the body. She may require prolonged weekly injections to appropriately supplement her loss additional may have other contributing deficiencies such as iron or copper.  --full nutritional evaluation today to include Vitamin b12 ,folate, MMA, homocysteine, copper, iron panel, and ferritin --will order PT/INR and PTT for easy bruising evaluation --RTC  pending the results of the above labs (may need weekly vitamin b12 injections or IV iron treatment or both).   Orders Placed This Encounter  Procedures  . CBC with Differential (Cancer Center Only)    Standing Status:   Future    Number of Occurrences:   1    Standing Expiration Date:   02/19/2021  . Retic Panel    Standing Status:   Future    Number of Occurrences:   1    Standing Expiration Date:   02/19/2021  . CMP ([facility] only)    Standing Status:   Future    Number of Occurrences:   1    Standing Expiration Date:   02/19/2021  . Lactate dehydrogenase (LDH)    Standing Status:    Future    Number of Occurrences:   1    Standing Expiration Date:   02/19/2021  . Iron and TIBC    Standing Status:   Future    Number of Occurrences:   1    Standing Expiration Date:   02/19/2021  . Ferritin    Standing Status:   Future    Number of Occurrences:   1    Standing Expiration Date:   02/19/2021  . Vitamin B12    Standing Status:   Future    Number of Occurrences:   1    Standing Expiration Date:   02/19/2021  . Folate, Serum    Standing Status:   Future    Number of Occurrences:   1    Standing Expiration Date:   02/19/2021  . Methylmalonic acid, serum    Standing Status:   Future    Number of Occurrences:   1    Standing Expiration Date:   02/19/2021  . Homocysteine, serum    Standing Status:   Future    Number of Occurrences:   1    Standing Expiration Date:   02/19/2021  . Copper, serum    Standing Status:   Future    Number of Occurrences:   1    Standing Expiration Date:   02/19/2021  . Protime-INR    Standing Status:   Future    Number of Occurrences:   1    Standing Expiration Date:   02/19/2021  . APTT    Standing Status:   Future    Number of Occurrences:   1    Standing Expiration Date:   02/19/2021    All questions were answered. The patient knows to call the clinic with any problems, questions or concerns.  A total of more than 60 minutes were spent on this encounter and over half of that time was spent on counseling and coordination of care as outlined above.   Ledell Peoples, MD Department of Hematology/Oncology [facility] Phone: [phone] Pager: [phone] Email: [email or web address]  02/20/2020 8:56 PM

## 2020-02-21 LAB — IRON AND TIBC
Iron: 116 ug/dL (ref 41–142)
Saturation Ratios: 33 % (ref 21–57)
TIBC: 350 ug/dL (ref 236–444)
UIBC: 234 ug/dL (ref 120–384)

## 2020-02-21 LAB — HOMOCYSTEINE: Homocysteine: 21.5 umol/L — ABNORMAL HIGH (ref 0.0–14.5)

## 2020-02-21 LAB — FERRITIN: Ferritin: 24 ng/mL (ref 11–307)

## 2020-02-24 LAB — COPPER, SERUM: Copper: 88 ug/dL (ref 80–158)

## 2020-02-26 LAB — METHYLMALONIC ACID, SERUM: Methylmalonic Acid, Quantitative: 361 nmol/L (ref 0–378)

## 2020-02-27 ENCOUNTER — Telehealth: Payer: Self-pay | Admitting: Hematology and Oncology

## 2020-02-27 ENCOUNTER — Telehealth: Payer: Self-pay | Admitting: *Deleted

## 2020-02-27 NOTE — Telephone Encounter (Signed)
-----   Message from Orson Slick, MD sent at 02/27/2020  9:00 AM EDT ----- Please let Ana Rose know that her findings are most consistent with a severe Vitamin B12 deficiency. This is undoubtedly due to her gastric bipass procedure. I would recommend continued weekly injections of Vitamin b12 until her Hgb normalizes. We can administer that here or it can be done with her PCP. If she wants it done here, we can start as early as next week.   We found no evidence of iron deficiency, copper deficiency, or other dietary nutrients that were low in her blood. We can plan to see her back in about 6-8 weeks time to assure this is working.  ----- Message ----- From: [place]: 02/20/2020   2:49 PM EDT To: Orson Slick, MD

## 2020-02-27 NOTE — Telephone Encounter (Signed)
TCT patient regarding recent lab results. Spoke with Ana Rose and advised that her lab work is consistent with severe Vitamin  B12 deficiency due to her Gastric Bypass surgery. Dr. Lorenso Courier recommends continuing weekly B12 injections. Pt voiced understanding and prefers to continue her injections @ her PCP office. Advised that we woulds see her back in 6-8 weeks to re-check labs.  Ana Rose voiced understanding to the above.  Scheduling message sent for future appt.

## 2020-02-27 NOTE — Telephone Encounter (Signed)
Scheduled appt per 10/6 sch msg - left message for patient with appt date and time ° °

## 2020-04-09 ENCOUNTER — Ambulatory Visit: Payer: 59 | Admitting: Hematology and Oncology

## 2020-04-09 ENCOUNTER — Other Ambulatory Visit: Payer: 59

## 2020-04-16 ENCOUNTER — Other Ambulatory Visit: Payer: 59

## 2020-04-16 ENCOUNTER — Ambulatory Visit: Payer: 59 | Admitting: Hematology and Oncology

## 2020-04-25 ENCOUNTER — Other Ambulatory Visit: Payer: Self-pay | Admitting: Hematology and Oncology

## 2020-04-25 ENCOUNTER — Inpatient Hospital Stay: Payer: 59 | Attending: Hematology and Oncology

## 2020-04-25 ENCOUNTER — Other Ambulatory Visit: Payer: Self-pay

## 2020-04-25 ENCOUNTER — Inpatient Hospital Stay (HOSPITAL_BASED_OUTPATIENT_CLINIC_OR_DEPARTMENT_OTHER): Payer: 59 | Admitting: Hematology and Oncology

## 2020-04-25 ENCOUNTER — Encounter: Payer: Self-pay | Admitting: Hematology and Oncology

## 2020-04-25 VITALS — BP 123/81 | HR 109 | Temp 97.0°F | Resp 18 | Ht 66.0 in | Wt 160.6 lb

## 2020-04-25 DIAGNOSIS — F319 Bipolar disorder, unspecified: Secondary | ICD-10-CM | POA: Insufficient documentation

## 2020-04-25 DIAGNOSIS — G40909 Epilepsy, unspecified, not intractable, without status epilepticus: Secondary | ICD-10-CM | POA: Insufficient documentation

## 2020-04-25 DIAGNOSIS — E538 Deficiency of other specified B group vitamins: Secondary | ICD-10-CM | POA: Insufficient documentation

## 2020-04-25 DIAGNOSIS — R233 Spontaneous ecchymoses: Secondary | ICD-10-CM

## 2020-04-25 DIAGNOSIS — E7800 Pure hypercholesterolemia, unspecified: Secondary | ICD-10-CM | POA: Diagnosis not present

## 2020-04-25 DIAGNOSIS — R238 Other skin changes: Secondary | ICD-10-CM

## 2020-04-25 DIAGNOSIS — Z79899 Other long term (current) drug therapy: Secondary | ICD-10-CM | POA: Insufficient documentation

## 2020-04-25 DIAGNOSIS — Z87891 Personal history of nicotine dependence: Secondary | ICD-10-CM | POA: Insufficient documentation

## 2020-04-25 DIAGNOSIS — E039 Hypothyroidism, unspecified: Secondary | ICD-10-CM | POA: Diagnosis not present

## 2020-04-25 DIAGNOSIS — K219 Gastro-esophageal reflux disease without esophagitis: Secondary | ICD-10-CM | POA: Diagnosis not present

## 2020-04-25 DIAGNOSIS — D649 Anemia, unspecified: Secondary | ICD-10-CM

## 2020-04-25 DIAGNOSIS — D509 Iron deficiency anemia, unspecified: Secondary | ICD-10-CM | POA: Insufficient documentation

## 2020-04-25 DIAGNOSIS — K589 Irritable bowel syndrome without diarrhea: Secondary | ICD-10-CM | POA: Diagnosis not present

## 2020-04-25 LAB — CBC WITH DIFFERENTIAL (CANCER CENTER ONLY)
Abs Immature Granulocytes: 0.01 10*3/uL (ref 0.00–0.07)
Basophils Absolute: 0.1 10*3/uL (ref 0.0–0.1)
Basophils Relative: 1 %
Eosinophils Absolute: 0.1 10*3/uL (ref 0.0–0.5)
Eosinophils Relative: 3 %
HCT: 29.4 % — ABNORMAL LOW (ref 36.0–46.0)
Hemoglobin: 9.2 g/dL — ABNORMAL LOW (ref 12.0–15.0)
Immature Granulocytes: 0 %
Lymphocytes Relative: 27 %
Lymphs Abs: 1.1 10*3/uL (ref 0.7–4.0)
MCH: 28.8 pg (ref 26.0–34.0)
MCHC: 31.3 g/dL (ref 30.0–36.0)
MCV: 92.2 fL (ref 80.0–100.0)
Monocytes Absolute: 0.4 10*3/uL (ref 0.1–1.0)
Monocytes Relative: 10 %
Neutro Abs: 2.4 10*3/uL (ref 1.7–7.7)
Neutrophils Relative %: 59 %
Platelet Count: 253 10*3/uL (ref 150–400)
RBC: 3.19 MIL/uL — ABNORMAL LOW (ref 3.87–5.11)
RDW: 17.8 % — ABNORMAL HIGH (ref 11.5–15.5)
WBC Count: 4.1 10*3/uL (ref 4.0–10.5)
nRBC: 0 % (ref 0.0–0.2)

## 2020-04-25 LAB — CMP (CANCER CENTER ONLY)
ALT: 23 U/L (ref 0–44)
AST: 29 U/L (ref 15–41)
Albumin: 3.7 g/dL (ref 3.5–5.0)
Alkaline Phosphatase: 92 U/L (ref 38–126)
Anion gap: 10 (ref 5–15)
BUN: 9 mg/dL (ref 6–20)
CO2: 21 mmol/L — ABNORMAL LOW (ref 22–32)
Calcium: 9.2 mg/dL (ref 8.9–10.3)
Chloride: 111 mmol/L (ref 98–111)
Creatinine: 0.82 mg/dL (ref 0.44–1.00)
GFR, Estimated: >60 mL/min (ref >60)
Glucose, Bld: 126 mg/dL — ABNORMAL HIGH (ref 70–99)
Potassium: 4.4 mmol/L (ref 3.5–5.1)
Sodium: 142 mmol/L (ref 135–145)
Total Bilirubin: <0.2 mg/dL — ABNORMAL LOW (ref 0.3–1.2)
Total Protein: 6.6 g/dL (ref 6.5–8.1)

## 2020-04-25 LAB — VITAMIN B12: Vitamin B-12: 681 pg/mL (ref 180–914)

## 2020-04-25 NOTE — Progress Notes (Signed)
[employer] Telephone:(336) [phone]   [phone]) [phone]  PROGRESS NOTE  Patient Care Team: Aretta Nip, MD as PCP - General (Family Medicine)  Hematological/Oncological History # Vitamin B12 deficiency  1) 11/17/2018: Iron 39, Ferritin 8, Sat 10%, TIBC 408  2) 11/14/2019: Vitamin b12 109, Folate 13.2,  3) 12/03/2019-12/17/2019: received weekly injections of 1000 mcg of Vitamin B12. An additional dose was given on 01/21/2020 4) 01/21/2020: WBC 4.3, Hgb 9.2, MCV 82.6, Plt 350. Vitamin B12 130 (nml 180-914)   5) 02/20/2020: establish care with Dr. Lorenso Courier. WBC 6.9, Hgb 10.0, MCV 83.3, Plt 340 6) 04/25/2020: WBC 4.1, Hgb 9.2, MCV 92.2, Plt 253   Interval History:  Ana Rose 44 y.o. female with medical history significant for vitamin b12 deficiency who presents for a follow up visit. The patient's last visit was on 02/20/2020. In the interim since the last visit she has been receiving her Vitamin b12 shots with her PCP and then administered by her spouse.   On exam today Mrs. Garbett is accompanied by her wife.  She reports that she has been receiving her vitamin B12 shots weekly and was previously receiving them at her PCPs office.  Now her wife administers them once a week.  She is unsure of the dosage that she is receiving.  She does that she is down 4 pounds from her last visit.  She denies having any issues with lightheadedness or dizziness.  She does continue to have marked fatigue.  She notes she still does continue to have some dumping syndrome but overall her bowel movements are solid.  She has made no dietary changes.  She currently denies any fevers, chills, sweats, nausea, or chest pain.  A full 10 point ROS is listed below.  MEDICAL HISTORY:  Past Medical History:  Diagnosis Date  . ADD (attention deficit disorder)   . Anemia   . Anxiety   . Bipolar disorder ([place])   . CIN I (cervical intraepithelial neoplasia I)   . Depression   . Epileptic seizures ([place])     Last seizure at 44 years of age  . GERD (gastroesophageal reflux disease)   . Heart murmur    funtional  . Hiatal hernia   . History of dysfunctional uterine bleeding   . Hypercholesteremia 08/31/2016   Noted in note  . Hypothyroidism   . Irritable bowel syndrome (IBS)   . PONV (postoperative nausea and vomiting)   . Suicide attempt ([place]) 08/24/2012    SURGICAL HISTORY: Past Surgical History:  Procedure Laterality Date  . CHOLECYSTECTOMY  2001  . COLONOSCOPY  05/27/2017  . CYST EXCISION Right 10/24/2018   Procedure: Excision of right inner thigh sebaceous cyst;  Surgeon: Wallace Going, DO;  Location: [facility];  Service: Plastics;  Laterality: Right;  . ESOPHAGOGASTRODUODENOSCOPY  05/2012  . GASTRIC BYPASS  2010  . GYNECOLOGIC CRYOSURGERY  Approximately age 69  . LAPAROSCOPIC TOTAL HYSTERECTOMY  7.16.2012   endometriosis on fallopian tubes  . LAPAROSCOPIC UNILATERAL SALPINGECTOMY Right 10/30/2018   Procedure: LAPAROSCOPIC UNILATERAL SALPINGECTOMY;  Surgeon: [name], MD;  Location: [facility];  Service: Gynecology;  Laterality: Right;  . LAPAROSCOPIC UNILATERAL SALPINGO OOPHERECTOMY Left 10/30/2018   Procedure: LAPAROSCOPIC UNILATERAL SALPINGO OOPHORECTOMY;  Surgeon: [name], MD;  Location: [facility];  Service: Gynecology;  Laterality: Left;  request 7:30am OR time in [place] [facility] block requests one hour  . TUBAL LIGATION  2001  . WISDOM TOOTH EXTRACTION  SOCIAL HISTORY: Social History   Socioeconomic History  . Marital status: Married    Spouse name: Not on file  . Number of children: Not on file  . Years of education: Not on file  . Highest education level: Not on file  Occupational History  . Not on file  Tobacco Use  . Smoking status: Former Smoker    Types: Cigarettes    Quit date: 11/30/1998    Years since quitting: 21.4  . Smokeless tobacco: Never Used  Vaping Use  . Vaping Use:  Never used  Substance and Sexual Activity  . Alcohol use: Not Currently    Alcohol/week: 0.0 standard drinks  . Drug use: No  . Sexual activity: Yes    Birth control/protection: Surgical    Comment: HYST.-1st intercourse 44 yo-Fewer than 5 partners  Other Topics Concern  . Not on file  Social History Narrative  . Not on file   Social Determinants of Health   Financial Resource Strain:   . Difficulty of Paying Living Expenses: Not on file  Food Insecurity:   . Worried About Charity fundraiser in the Last Year: Not on file  . Ran Out of Food in the Last Year: Not on file  Transportation Needs:   . Lack of Transportation (Medical): Not on file  . Lack of Transportation (Non-Medical): Not on file  Physical Activity:   . Days of Exercise per Week: Not on file  . Minutes of Exercise per Session: Not on file  Stress:   . Feeling of Stress : Not on file  Social Connections:   . Frequency of Communication with Friends and Family: Not on file  . Frequency of Social Gatherings with Friends and Family: Not on file  . Attends Religious Services: Not on file  . Active Member of Clubs or Organizations: Not on file  . Attends Archivist Meetings: Not on file  . Marital Status: Not on file  Intimate Partner Violence:   . Fear of Current or Ex-Partner: Not on file  . Emotionally Abused: Not on file  . Physically Abused: Not on file  . Sexually Abused: Not on file    FAMILY HISTORY: Family History  Problem Relation Age of Onset  . Hypertension Mother   . Hypertension Father   . Diabetes Paternal Grandmother   . Cancer Paternal Grandmother        OV/UT  . Breast cancer Paternal Grandmother 81  . Breast cancer Cousin 34       MATERNAL  . Alcoholism Paternal Grandfather     ALLERGIES:  is allergic to hydrocodone, lactose intolerance (gi), nsaids, and xanax [alprazolam].  MEDICATIONS:  Current Outpatient Medications  Medication Sig Dispense Refill  . acetaminophen  (TYLENOL) 325 MG tablet Tylenol  prn    . EQUETRO 100 MG CP12 12 hr capsule TAKE ONE CAPSULE BY MOUTH EVERY MORNING AND TAKE 2 CAPSULES BY MOUTH AT BEDTIME    . gabapentin (NEURONTIN) 300 MG capsule Take 1 capsule (300 mg total) by mouth daily. (Patient taking differently: Take 300-600 mg by mouth every 8 (eight) hours as needed (for anxiety). ) 30 capsule 0  . lamoTRIgine (LAMICTAL) 200 MG tablet Take 1 tablet (200 mg total) by mouth every morning. (Patient taking differently: Take 200 mg by mouth every morning. ) 30 tablet 0  . liothyronine (CYTOMEL) 25 MCG tablet liothyronine 25 mcg tablet  TAKE 1 TABLET BY MOUTH EVERY DAY (BEST TO TAKE ON AN EMPTY STOMACH)    .  lisdexamfetamine (VYVANSE) 50 MG capsule Take 1 capsule (50 mg total) by mouth every morning. 1 capsule 0  . lithium carbonate (LITHOBID) 300 MG CR tablet TAKE 1 TABLET BY MOUTH EVERYDAY AT BEDTIME    . Melatonin 3 MG TABS Take 3 mg by mouth at bedtime.    . ondansetron (ZOFRAN) 4 MG tablet Take 4 mg by mouth daily.     . pantoprazole (PROTONIX) 40 MG tablet Take 1 tablet (40 mg total) by mouth at bedtime. 30 tablet 0  . topiramate (TOPAMAX) 50 MG tablet Take 100 mg by mouth at bedtime.    . Vitamin D, Ergocalciferol, (DRISDOL) 50000 units CAPS capsule Take 50,000 Units by mouth every 7 (seven) days.     No current facility-administered medications for this visit.    REVIEW OF SYSTEMS:   Constitutional: ( - ) fevers, ( - )  chills , ( - ) night sweats Eyes: ( - ) blurriness of vision, ( - ) double vision, ( - ) watery eyes Ears, nose, mouth, throat, and face: ( - ) mucositis, ( - ) sore throat Respiratory: ( - ) cough, ( - ) dyspnea, ( - ) wheezes Cardiovascular: ( - ) palpitation, ( - ) chest discomfort, ( - ) lower extremity swelling Gastrointestinal:  ( - ) nausea, ( - ) heartburn, ( - ) change in bowel habits Skin: ( - ) abnormal skin rashes Lymphatics: ( - ) new lymphadenopathy, ( - ) easy bruising Neurological: ( - )  numbness, ( - ) tingling, ( - ) new weaknesses Behavioral/Psych: ( - ) mood change, ( - ) new changes  All other systems were reviewed with the patient and are negative.  PHYSICAL EXAMINATION:  Vitals:   04/25/20 1450  BP: 123/81  Pulse: (!) 109  Resp: 18  Temp: (!) 97 F (36.1 C)  SpO2: 100%   Filed Weights   04/25/20 1450  Weight: 160 lb 9.6 oz (72.8 kg)    GENERAL: well appearing middle aged Caucasian female. alert, no distress and comfortable SKIN: skin color, texture, turgor are normal, no rashes or significant lesions EYES: conjunctiva are pink and non-injected, sclera clear LUNGS: clear to auscultation and percussion with normal breathing effort HEART: regular rate & rhythm and no murmurs and no lower extremity edema Musculoskeletal: no cyanosis of digits and no clubbing  PSYCH: alert & oriented x 3, fluent speech NEURO: no focal motor/sensory deficits  LABORATORY DATA:  I have reviewed the data as listed CBC Latest Ref Rng & Units 04/25/2020 02/20/2020 11/17/2018  WBC 4.0 - 10.5 K/uL 4.1 6.9 7.3  Hemoglobin 12.0 - 15.0 g/dL 9.2(L) 10.0(L) 10.2(L)  Hematocrit 36 - 46 % 29.4(L) 31.9(L) 30.8(L)  Platelets 150 - 400 K/uL 253 340 351    CMP Latest Ref Rng & Units 04/25/2020 02/20/2020 08/11/2015  Glucose 70 - 99 mg/dL 126(H) 74 92  BUN 6 - 20 mg/dL _0 Creatinine 0.44 - 1.00 mg/dL 0.82 0.85 0.69  Sodium 135 - 145 mmol/L 142 138 138  Potassium 3.5 - 5.1 mmol/L 4.4 4.1 4.1  Chloride 98 - 111 mmol/L 111 102 100  CO2 22 - 32 mmol/L 21(L) 25 26  Calcium 8.9 - 10.3 mg/dL 9.2 9.6 8.8  Total Protein 6.5 - 8.1 g/dL 6.6 7.5 6.8  Total Bilirubin 0.3 - 1.2 mg/dL <0.2(L) 0.4 0.3  Alkaline Phos 38 - 126 U/L 92 104 72  AST 15 - 41 U/L 29 51(H) 26  ALT 0 -  44 U/L _0 RADIOGRAPHIC STUDIES: No results found.  ASSESSMENT & PLAN Ana Rose 44 y.o. female with medical history significant for vitamin b12 deficiency who presents for a follow up visit.  After review the  labs, the records, discussion with the patient the findings are most consistent with continued/worsening anemia.  This is concerning as we had identified the nutritional deficiency as vitamin B12.  She has received weekly shots as recommended, though the dose is not clear at this time as this was prescribed by her primary care provider is currently being ministered at home by her spouse.  In the event the patient has had normalization of her vitamin B12 levels we would need to consider alternative causes of her anemia.  I would recommend a repeat nutritional evaluation as well as consideration of hemolysis labs, multiple myeloma panel, and if no etiology can be found even a bone marrow biopsy.  We will put a placeholder appointment on for 6 weeks from now.  # Vitamin B12 deficiency  #Normocytic Anemia, worsening # Iron Deficiency Anemia #s/p Gastric Bypass in 2010 --findings were most consistent with nutritional deficiencies 2/2 to her gastric bypass, however her hemoglobin has continued to drop despite vitamin B12 therapy --prior full nutritional evaluation with Vitamin b12 ,folate, MMA, homocysteine, copper, iron panel, and ferritin showed findings consistent with Vitamin B12 deficiency  --return to clinic for labs if vitamin b12 level has normalized. Consider hemolysis labs, MM labs, and repeat iron studies. If no etiology can be found a bone marrow biopsy would need to be considered.  --RTC pending the results of the vitamin b12 level. Continue weekly vitamin b12 injections and assure proper dosage/administration.   No orders of the defined types were placed in this encounter.  All questions were answered. The patient knows to call the clinic with any problems, questions or concerns.  A total of more than 30 minutes were spent on this encounter and over half of that time was spent on counseling and coordination of care as outlined above.   Ledell Peoples, MD Department of  Hematology/Oncology [place] at [facility] Phone: [phone] Pager: [phone] Email: [email or web address]  04/25/2020 3:32 PM

## 2020-04-30 ENCOUNTER — Telehealth: Payer: Self-pay | Admitting: Hematology and Oncology

## 2020-04-30 NOTE — Telephone Encounter (Signed)
Scheduled per los. Called and left msg. Mailed printout  °

## 2020-05-01 LAB — METHYLMALONIC ACID, SERUM: Methylmalonic Acid, Quantitative: 170 nmol/L (ref 0–378)

## 2020-06-06 ENCOUNTER — Other Ambulatory Visit: Payer: Self-pay

## 2020-06-06 ENCOUNTER — Other Ambulatory Visit: Payer: Self-pay | Admitting: *Deleted

## 2020-06-06 ENCOUNTER — Inpatient Hospital Stay: Payer: 59

## 2020-06-06 ENCOUNTER — Inpatient Hospital Stay: Payer: 59 | Attending: Hematology and Oncology | Admitting: Hematology and Oncology

## 2020-06-06 DIAGNOSIS — E538 Deficiency of other specified B group vitamins: Secondary | ICD-10-CM

## 2020-06-06 DIAGNOSIS — Z79899 Other long term (current) drug therapy: Secondary | ICD-10-CM | POA: Diagnosis not present

## 2020-06-06 DIAGNOSIS — R233 Spontaneous ecchymoses: Secondary | ICD-10-CM

## 2020-06-06 DIAGNOSIS — R238 Other skin changes: Secondary | ICD-10-CM

## 2020-06-06 DIAGNOSIS — D508 Other iron deficiency anemias: Secondary | ICD-10-CM | POA: Diagnosis present

## 2020-06-06 DIAGNOSIS — K589 Irritable bowel syndrome without diarrhea: Secondary | ICD-10-CM | POA: Insufficient documentation

## 2020-06-06 DIAGNOSIS — G40909 Epilepsy, unspecified, not intractable, without status epilepticus: Secondary | ICD-10-CM | POA: Diagnosis not present

## 2020-06-06 DIAGNOSIS — Z90721 Acquired absence of ovaries, unilateral: Secondary | ICD-10-CM | POA: Diagnosis not present

## 2020-06-06 DIAGNOSIS — Z9049 Acquired absence of other specified parts of digestive tract: Secondary | ICD-10-CM | POA: Insufficient documentation

## 2020-06-06 DIAGNOSIS — K219 Gastro-esophageal reflux disease without esophagitis: Secondary | ICD-10-CM | POA: Diagnosis not present

## 2020-06-06 DIAGNOSIS — D649 Anemia, unspecified: Secondary | ICD-10-CM

## 2020-06-06 DIAGNOSIS — Z87891 Personal history of nicotine dependence: Secondary | ICD-10-CM | POA: Diagnosis not present

## 2020-06-06 DIAGNOSIS — F319 Bipolar disorder, unspecified: Secondary | ICD-10-CM | POA: Diagnosis not present

## 2020-06-06 DIAGNOSIS — E7800 Pure hypercholesterolemia, unspecified: Secondary | ICD-10-CM | POA: Diagnosis not present

## 2020-06-06 DIAGNOSIS — E039 Hypothyroidism, unspecified: Secondary | ICD-10-CM | POA: Diagnosis not present

## 2020-06-06 DIAGNOSIS — Z9884 Bariatric surgery status: Secondary | ICD-10-CM | POA: Diagnosis not present

## 2020-06-06 LAB — CMP (CANCER CENTER ONLY)
ALT: 13 U/L (ref 0–44)
AST: 25 U/L (ref 15–41)
Albumin: 4.1 g/dL (ref 3.5–5.0)
Alkaline Phosphatase: 79 U/L (ref 38–126)
Anion gap: 13 (ref 5–15)
BUN: 13 mg/dL (ref 6–20)
CO2: 21 mmol/L — ABNORMAL LOW (ref 22–32)
Calcium: 8.7 mg/dL — ABNORMAL LOW (ref 8.9–10.3)
Chloride: 104 mmol/L (ref 98–111)
Creatinine: 0.86 mg/dL (ref 0.44–1.00)
GFR, Estimated: >60 mL/min (ref >60)
Glucose, Bld: 74 mg/dL (ref 70–99)
Potassium: 4.2 mmol/L (ref 3.5–5.1)
Sodium: 138 mmol/L (ref 135–145)
Total Bilirubin: 0.3 mg/dL (ref 0.3–1.2)
Total Protein: 7.3 g/dL (ref 6.5–8.1)

## 2020-06-06 LAB — CBC WITH DIFFERENTIAL (CANCER CENTER ONLY)
Abs Immature Granulocytes: 0.02 10*3/uL (ref 0.00–0.07)
Basophils Absolute: 0.1 10*3/uL (ref 0.0–0.1)
Basophils Relative: 1 %
Eosinophils Absolute: 0.1 10*3/uL (ref 0.0–0.5)
Eosinophils Relative: 1 %
HCT: 29.3 % — ABNORMAL LOW (ref 36.0–46.0)
Hemoglobin: 9.3 g/dL — ABNORMAL LOW (ref 12.0–15.0)
Immature Granulocytes: 0 %
Lymphocytes Relative: 28 %
Lymphs Abs: 1.8 10*3/uL (ref 0.7–4.0)
MCH: 27.6 pg (ref 26.0–34.0)
MCHC: 31.7 g/dL (ref 30.0–36.0)
MCV: 86.9 fL (ref 80.0–100.0)
Monocytes Absolute: 0.5 10*3/uL (ref 0.1–1.0)
Monocytes Relative: 8 %
Neutro Abs: 4.1 10*3/uL (ref 1.7–7.7)
Neutrophils Relative %: 62 %
Platelet Count: 355 10*3/uL (ref 150–400)
RBC: 3.37 MIL/uL — ABNORMAL LOW (ref 3.87–5.11)
RDW: 19.5 % — ABNORMAL HIGH (ref 11.5–15.5)
WBC Count: 6.6 10*3/uL (ref 4.0–10.5)
nRBC: 0 % (ref 0.0–0.2)

## 2020-06-06 LAB — VITAMIN B12: Vitamin B-12: 502 pg/mL (ref 180–914)

## 2020-06-06 NOTE — Progress Notes (Signed)
[place] Telephone:(336) [phone]   Fax:(336) [phone]  PROGRESS NOTE  Patient Care Team: Aretta Nip, MD as PCP - General (Family Medicine)  Hematological/Oncological History # Normocytic Anemia 1) 11/17/2018: Iron 39, Ferritin 8, Sat 10%, TIBC 408  2) 11/14/2019: Vitamin b12 109, Folate 13.2,  3) 12/03/2019-12/17/2019: received weekly injections of 1000 mcg of Vitamin B12. An additional dose was given on 01/21/2020 4) 01/21/2020: WBC 4.3, Hgb 9.2, MCV 82.6, Plt 350. Vitamin B12 130 (nml 180-914)   5) 02/20/2020: establish care with Dr. Lorenso Courier. WBC 6.9, Hgb 10.0, MCV 83.3, Plt 340 6) 04/25/2020: WBC 4.1, Hgb 9.2, MCV 92.2, Plt 253   Interval History:  Ana Rose 45 y.o. female with medical history significant for vitamin b12 deficiency who presents for a follow up visit. The patient's last visit was on 04/25/2020. In the interim since the last visit she has been receiving her Vitamin b12 shots administered by her spouse.   On exam today Ana Rose reports that she has been at her baseline level of health since our last visit.  She is continue to take the vitamin B12 injections and had no problem with that.  She notes that she does occasionally have some issues with feeling dizziness or lightheadedness when she tries to stand up, but otherwise denies any new symptoms.  She currently denies any fevers, chills, sweats, nausea, or chest pain.  A full 10 point ROS is listed below.  MEDICAL HISTORY:  Past Medical History:  Diagnosis Date  . ADD (attention deficit disorder)   . Anemia   . Anxiety   . Bipolar disorder ([place])   . CIN I (cervical intraepithelial neoplasia I)   . Depression   . Epileptic seizures ([place])    Last seizure at 45 years of age  . GERD (gastroesophageal reflux disease)   . Heart murmur    funtional  . Hiatal hernia   . History of dysfunctional uterine bleeding   . Hypercholesteremia 08/31/2016   Noted in note  . Hypothyroidism   .  Irritable bowel syndrome (IBS)   . PONV (postoperative nausea and vomiting)   . Suicide attempt ([place]) 08/24/2012    SURGICAL HISTORY: Past Surgical History:  Procedure Laterality Date  . CHOLECYSTECTOMY  2001  . COLONOSCOPY  05/27/2017  . CYST EXCISION Right 10/24/2018   Procedure: Excision of right inner thigh sebaceous cyst;  Surgeon: Wallace Going, DO;  Location: [facility];  Service: Plastics;  Laterality: Right;  . ESOPHAGOGASTRODUODENOSCOPY  05/2012  . GASTRIC BYPASS  2010  . GYNECOLOGIC CRYOSURGERY  Approximately age 34  . LAPAROSCOPIC TOTAL HYSTERECTOMY  7.16.2012   endometriosis on fallopian tubes  . LAPAROSCOPIC UNILATERAL SALPINGECTOMY Right 10/30/2018   Procedure: LAPAROSCOPIC UNILATERAL SALPINGECTOMY;  Surgeon: [name], MD;  Location: [facility];  Service: Gynecology;  Laterality: Right;  . LAPAROSCOPIC UNILATERAL SALPINGO OOPHERECTOMY Left 10/30/2018   Procedure: LAPAROSCOPIC UNILATERAL SALPINGO OOPHORECTOMY;  Surgeon: [name], MD;  Location: [facility];  Service: Gynecology;  Laterality: Left;  request 7:30am OR time in [place] [facility] block requests one hour  . TUBAL LIGATION  2001  . WISDOM TOOTH EXTRACTION      SOCIAL HISTORY: Social History   Socioeconomic History  . Marital status: Married    Spouse name: Not on file  . Number of children: Not on file  . Years of education: Not on file  . Highest education level: Not on file  Occupational History  .  Not on file  Tobacco Use  . Smoking status: Former Smoker    Types: Cigarettes    Quit date: 11/30/1998    Years since quitting: 21.5  . Smokeless tobacco: Never Used  Vaping Use  . Vaping Use: Never used  Substance and Sexual Activity  . Alcohol use: Not Currently    Alcohol/week: 0.0 standard drinks  . Drug use: No  . Sexual activity: Yes    Birth control/protection: Surgical    Comment: HYST.-1st intercourse 45 yo-Fewer than 5  partners  Other Topics Concern  . Not on file  Social History Narrative  . Not on file   Social Determinants of Health   Financial Resource Strain: Not on file  Food Insecurity: Not on file  Transportation Needs: Not on file  Physical Activity: Not on file  Stress: Not on file  Social Connections: Not on file  Intimate Partner Violence: Not on file    FAMILY HISTORY: Family History  Problem Relation Age of Onset  . Hypertension Mother   . Hypertension Father   . Diabetes Paternal Grandmother   . Cancer Paternal Grandmother        OV/UT  . Breast cancer Paternal Grandmother 46  . Breast cancer Cousin 34       MATERNAL  . Alcoholism Paternal Grandfather     ALLERGIES:  is allergic to hydrocodone, lactose intolerance (gi), nsaids, and xanax [alprazolam].  MEDICATIONS:  Current Outpatient Medications  Medication Sig Dispense Refill  . acetaminophen (TYLENOL) 325 MG tablet Tylenol  prn    . cyanocobalamin (,VITAMIN B-12,) 1000 MCG/ML injection     . EQUETRO 100 MG CP12 12 hr capsule TAKE ONE CAPSULE BY MOUTH EVERY MORNING AND TAKE 2 CAPSULES BY MOUTH AT BEDTIME    . gabapentin (NEURONTIN) 300 MG capsule Take 1 capsule (300 mg total) by mouth daily. (Patient taking differently: Take 300-600 mg by mouth every 8 (eight) hours as needed (for anxiety). ) 30 capsule 0  . lamoTRIgine (LAMICTAL) 200 MG tablet Take 1 tablet (200 mg total) by mouth every morning. (Patient taking differently: Take 200 mg by mouth every morning. ) 30 tablet 0  . liothyronine (CYTOMEL) 25 MCG tablet liothyronine 25 mcg tablet  TAKE 1 TABLET BY MOUTH EVERY DAY (BEST TO TAKE ON AN EMPTY STOMACH)    . lisdexamfetamine (VYVANSE) 50 MG capsule Take 1 capsule (50 mg total) by mouth every morning. 1 capsule 0  . lithium carbonate (LITHOBID) 300 MG CR tablet TAKE 1 TABLET BY MOUTH EVERYDAY AT BEDTIME    . Melatonin 3 MG TABS Take 3 mg by mouth at bedtime.    . ondansetron (ZOFRAN) 4 MG tablet Take 4 mg by mouth  daily.     . pantoprazole (PROTONIX) 40 MG tablet Take 1 tablet (40 mg total) by mouth at bedtime. 30 tablet 0  . topiramate (TOPAMAX) 50 MG tablet Take 100 mg by mouth at bedtime.    . Vitamin D, Ergocalciferol, (DRISDOL) 50000 units CAPS capsule Take 50,000 Units by mouth every 7 (seven) days.     No current facility-administered medications for this visit.    REVIEW OF SYSTEMS:   Constitutional: ( - ) fevers, ( - )  chills , ( - ) night sweats Eyes: ( - ) blurriness of vision, ( - ) double vision, ( - ) watery eyes Ears, nose, mouth, throat, and face: ( - ) mucositis, ( - ) sore throat Respiratory: ( - ) cough, ( - ) dyspnea, ( - )  wheezes Cardiovascular: ( - ) palpitation, ( - ) chest discomfort, ( - ) lower extremity swelling Gastrointestinal:  ( - ) nausea, ( - ) heartburn, ( - ) change in bowel habits Skin: ( - ) abnormal skin rashes Lymphatics: ( - ) new lymphadenopathy, ( - ) easy bruising Neurological: ( - ) numbness, ( - ) tingling, ( - ) new weaknesses Behavioral/Psych: ( - ) mood change, ( - ) new changes  All other systems were reviewed with the patient and are negative.  PHYSICAL EXAMINATION:  There were no vitals filed for this visit. There were no vitals filed for this visit.  GENERAL: well appearing middle aged Caucasian female. alert, no distress and comfortable SKIN: skin color, texture, turgor are normal, no rashes or significant lesions EYES: conjunctiva are pink and non-injected, sclera clear LUNGS: clear to auscultation and percussion with normal breathing effort HEART: regular rate & rhythm and no murmurs and no lower extremity edema Musculoskeletal: no cyanosis of digits and no clubbing  PSYCH: alert & oriented x 3, fluent speech NEURO: no focal motor/sensory deficits  LABORATORY DATA:  I have reviewed the data as listed CBC Latest Ref Rng & Units 06/06/2020 04/25/2020 02/20/2020  WBC 4.0 - 10.5 K/uL 6.6 4.1 6.9  Hemoglobin 12.0 - 15.0 g/dL 9.3(L) 9.2(L)  10.0(L)  Hematocrit 36.0 - 46.0 % 29.3(L) 29.4(L) 31.9(L)  Platelets 150 - 400 K/uL 355 253 340    CMP Latest Ref Rng & Units 06/06/2020 04/25/2020 02/20/2020  Glucose 70 - 99 mg/dL 74 126(H) 74  BUN 6 - 20 mg/dL _0 Creatinine 0.44 - 1.00 mg/dL 0.86 0.82 0.85  Sodium 135 - 145 mmol/L 138 142 138  Potassium 3.5 - 5.1 mmol/L 4.2 4.4 4.1  Chloride 98 - 111 mmol/L 104 111 102  CO2 22 - 32 mmol/L 21(L) 21(L) 25  Calcium 8.9 - 10.3 mg/dL 8.7(L) 9.2 9.6  Total Protein 6.5 - 8.1 g/dL 7.3 6.6 7.5  Total Bilirubin 0.3 - 1.2 mg/dL 0.3 <0.2(L) 0.4  Alkaline Phos 38 - 126 U/L 79 92 104  AST 15 - 41 U/L 25 29 51(H)  ALT 0 - 44 U/L _1 RADIOGRAPHIC STUDIES: No results found.  ASSESSMENT & PLAN Ana URBACH 45 y.o. female with medical history significant for vitamin b12 deficiency/normocytic anemia who presents for a follow up visit.  After review the labs, the records, discussion with the patient the findings are most consistent with continued/worsening anemia.  This is concerning as we had identified the nutritional deficiency as vitamin B12.  She has received weekly shots as recommended, though the dose is not clear at this time as this was prescribed by her primary care provider is currently being ministered at home by her spouse.  Because the patient has had normalization of her vitamin B12 levels we need to consider alternative causes of her anemia.  I would recommend a bone marrow biopsy at this time. We will plan for a repeat visit after the bone marrow biopsy has returned.   # Vitamin B12 deficiency  #Normocytic Anemia, worsening # Iron Deficiency Anemia #s/p Gastric Bypass in 2010 --findings were most consistent with nutritional deficiencies 2/2 to her gastric bypass, however her hemoglobin has continued to drop despite vitamin B12 therapy --prior full nutritional evaluation with Vitamin b12 ,folate, MMA, homocysteine, copper, iron panel, and ferritin showed findings consistent  with Vitamin B12 deficiency  --after extensive peripheral blood work no etiology has been found.  A bone marrow biopsy is recommended to further evaluate.  --RTC pending the results of the bone marrow biopsy. Continue weekly vitamin b12 injections and assure proper dosage/administration.   No orders of the defined types were placed in this encounter.  All questions were answered. The patient knows to call the clinic with any problems, questions or concerns.  A total of more than 30 minutes were spent on this encounter and over half of that time was spent on counseling and coordination of care as outlined above.   Ledell Peoples, MD Department of Hematology/Oncology [place] at [facility] Phone: [phone] Pager: [phone] Email: [email or web address]  06/09/2020 1:54 PM

## 2020-06-09 ENCOUNTER — Encounter: Payer: Self-pay | Admitting: Hematology and Oncology

## 2020-06-10 ENCOUNTER — Telehealth: Payer: Self-pay | Admitting: Hematology and Oncology

## 2020-06-10 NOTE — Telephone Encounter (Signed)
Scheduled per 1/14 los. Called and spoke with pt, confirmed 2/2 appt

## 2020-06-19 ENCOUNTER — Other Ambulatory Visit: Payer: Self-pay | Admitting: Student

## 2020-06-19 ENCOUNTER — Other Ambulatory Visit: Payer: Self-pay | Admitting: Radiology

## 2020-06-20 ENCOUNTER — Encounter (HOSPITAL_COMMUNITY): Payer: Self-pay

## 2020-06-20 ENCOUNTER — Other Ambulatory Visit: Payer: Self-pay

## 2020-06-20 ENCOUNTER — Ambulatory Visit (HOSPITAL_COMMUNITY)
Admission: RE | Admit: 2020-06-20 | Discharge: 2020-06-20 | Disposition: A | Discharge status: Home / Self Care | Payer: 59 | Source: Ambulatory Visit | Attending: Hematology and Oncology | Admitting: Hematology and Oncology

## 2020-06-20 DIAGNOSIS — E538 Deficiency of other specified B group vitamins: Secondary | ICD-10-CM | POA: Diagnosis not present

## 2020-06-20 DIAGNOSIS — D649 Anemia, unspecified: Secondary | ICD-10-CM | POA: Insufficient documentation

## 2020-06-20 LAB — CBC WITH DIFFERENTIAL/PLATELET
Abs Immature Granulocytes: 0.01 10*3/uL (ref 0.00–0.07)
Basophils Absolute: 0.1 10*3/uL (ref 0.0–0.1)
Basophils Relative: 2 %
Eosinophils Absolute: 0.1 10*3/uL (ref 0.0–0.5)
Eosinophils Relative: 2 %
HCT: 33.1 % — ABNORMAL LOW (ref 36.0–46.0)
Hemoglobin: 10.4 g/dL — ABNORMAL LOW (ref 12.0–15.0)
Immature Granulocytes: 0 %
Lymphocytes Relative: 49 %
Lymphs Abs: 2.2 10*3/uL (ref 0.7–4.0)
MCH: 28.3 pg (ref 26.0–34.0)
MCHC: 31.4 g/dL (ref 30.0–36.0)
MCV: 89.9 fL (ref 80.0–100.0)
Monocytes Absolute: 0.5 10*3/uL (ref 0.1–1.0)
Monocytes Relative: 12 %
Neutro Abs: 1.6 10*3/uL — ABNORMAL LOW (ref 1.7–7.7)
Neutrophils Relative %: 35 %
Platelets: 311 10*3/uL (ref 150–400)
RBC: 3.68 MIL/uL — ABNORMAL LOW (ref 3.87–5.11)
RDW: 20.6 % — ABNORMAL HIGH (ref 11.5–15.5)
WBC: 4.5 10*3/uL (ref 4.0–10.5)
nRBC: 0 % (ref 0.0–0.2)

## 2020-06-20 MED ORDER — FENTANYL CITRATE (PF) 100 MCG/2ML IJ SOLN
INTRAMUSCULAR | Status: AC | PRN
  Administered 2020-06-20 (×2): 50 ug via INTRAVENOUS

## 2020-06-20 MED ORDER — FENTANYL CITRATE (PF) 100 MCG/2ML IJ SOLN
INTRAMUSCULAR | Status: AC
  Filled 2020-06-20: qty 2

## 2020-06-20 MED ORDER — MIDAZOLAM HCL 2 MG/2ML IJ SOLN
INTRAMUSCULAR | Status: AC | PRN
  Administered 2020-06-20: 1 mg via INTRAVENOUS
  Administered 2020-06-20: 0.5 mg via INTRAVENOUS
  Administered 2020-06-20: 1 mg via INTRAVENOUS

## 2020-06-20 MED ORDER — LIDOCAINE HCL (PF) 1 % IJ SOLN
INTRAMUSCULAR | Status: AC | PRN
  Administered 2020-06-20: 10 mL via INTRADERMAL

## 2020-06-20 MED ORDER — SODIUM CHLORIDE 0.9 % IV SOLN: INTRAVENOUS | Status: DC

## 2020-06-20 MED ORDER — MIDAZOLAM HCL 2 MG/2ML IJ SOLN
INTRAMUSCULAR | Status: AC
  Filled 2020-06-20: qty 4

## 2020-06-20 MED ORDER — FLUMAZENIL 0.5 MG/5ML IV SOLN
INTRAVENOUS | Status: AC
  Filled 2020-06-20: qty 5

## 2020-06-20 MED ORDER — NALOXONE HCL 0.4 MG/ML IJ SOLN
INTRAMUSCULAR | Status: AC
  Filled 2020-06-20: qty 1

## 2020-06-20 NOTE — Procedures (Signed)
Interventional Radiology Procedure Note  Procedure: CT BM ASP AND CORE BX    Complications: None  Estimated Blood Loss:  MIN  Findings: 11 G CORE AND ASP FULL REPORT IN PACS     Tamera Punt, MD

## 2020-06-20 NOTE — H&P (Signed)
Chief Complaint: Patient was seen in consultation today for bone marrow biopsy with aspiration.   Referring Physician(s): Orson Slick  Supervising Physician: Daryll Brod  Patient Status: [facility] - Out-pt  History of Present Illness: Ana Rose is a 45 y.o. female with a medical history significant for ADD, Bipolar disorder, anxiety, cervical intraepithelial neoplasia I, seizures (last one at 42 yrs), gastric bypass (2010), B12 deficiency and normocytic anemia. She is followed by hematology/oncology and despite weekly B12 injections the patient's anemia continues to worsen.   Interventional Radiology has been asked to evaluate this patient for an image-guided bone marrow biopsy with aspiration for further work up.   Past Medical History:  Diagnosis Date  . ADD (attention deficit disorder)   . Anemia   . Anxiety   . Bipolar disorder ([place])   . CIN I (cervical intraepithelial neoplasia I)   . Depression   . Epileptic seizures ([place])    Last seizure at 45 years of age  . GERD (gastroesophageal reflux disease)   . Heart murmur    funtional  . Hiatal hernia   . History of dysfunctional uterine bleeding   . Hypercholesteremia 08/31/2016   Noted in note  . Hypothyroidism   . Irritable bowel syndrome (IBS)   . PONV (postoperative nausea and vomiting)   . Suicide attempt ([place]) 08/24/2012    Past Surgical History:  Procedure Laterality Date  . CHOLECYSTECTOMY  2001  . COLONOSCOPY  05/27/2017  . CYST EXCISION Right 10/24/2018   Procedure: Excision of right inner thigh sebaceous cyst;  Surgeon: Wallace Going, DO;  Location: [place];  Service: Plastics;  Laterality: Right;  . ESOPHAGOGASTRODUODENOSCOPY  05/2012  . GASTRIC BYPASS  2010  . GYNECOLOGIC CRYOSURGERY  Approximately age 22  . LAPAROSCOPIC TOTAL HYSTERECTOMY  7.16.2012   endometriosis on fallopian tubes  . LAPAROSCOPIC UNILATERAL SALPINGECTOMY Right 10/30/2018   Procedure: LAPAROSCOPIC  UNILATERAL SALPINGECTOMY;  Surgeon: [name], MD;  Location: [place];  Service: Gynecology;  Laterality: Right;  . LAPAROSCOPIC UNILATERAL SALPINGO OOPHERECTOMY Left 10/30/2018   Procedure: LAPAROSCOPIC UNILATERAL SALPINGO OOPHORECTOMY;  Surgeon: [name], MD;  Location: [place];  Service: Gynecology;  Laterality: Left;  request 7:30am OR time in [place] [facility] block requests one hour  . TUBAL LIGATION  2001  . WISDOM TOOTH EXTRACTION      Allergies: Hydrocodone, Lactose intolerance (gi), Nsaids, and Xanax [alprazolam]  Medications: Prior to Admission medications   Medication Sig Start Date End Date Taking? Authorizing Provider  cyanocobalamin (,VITAMIN B-12,) 1000 MCG/ML injection  04/03/20  Yes [provider]  EQUETRO 100 MG CP12 12 hr capsule TAKE ONE CAPSULE BY MOUTH EVERY MORNING AND TAKE 2 CAPSULES BY MOUTH AT BEDTIME 09/28/18  Yes [provider]  gabapentin (NEURONTIN) 300 MG capsule Take 1 capsule (300 mg total) by mouth daily. Patient taking differently: Take 300-600 mg by mouth every 8 (eight) hours as needed (for anxiety). 08/28/12  Yes Patrecia Pour, NP  lamoTRIgine (LAMICTAL) 200 MG tablet Take 1 tablet (200 mg total) by mouth every morning. Patient taking differently: Take 200 mg by mouth every morning. 08/28/12  Yes Lord, Asa Saunas, NP  liothyronine (CYTOMEL) 25 MCG tablet liothyronine 25 mcg tablet  TAKE 1 TABLET BY MOUTH EVERY DAY (BEST TO TAKE ON AN EMPTY STOMACH)   Yes [provider]  lisdexamfetamine (VYVANSE) 50 MG capsule Take 1 capsule (50 mg total) by mouth every morning. 08/28/12  Yes Patrecia Pour, NP  lithium carbonate (LITHOBID) 300 MG CR tablet TAKE 1 TABLET BY MOUTH EVERYDAY AT BEDTIME 10/05/18  Yes [provider]  ondansetron (ZOFRAN) 4 MG tablet Take 4 mg by mouth daily.   Yes [provider]  pantoprazole (PROTONIX) 40 MG tablet Take 1 tablet (40 mg total) by  mouth at bedtime. 08/28/12  Yes Patrecia Pour, NP  topiramate (TOPAMAX) 50 MG tablet Take 100 mg by mouth at bedtime. 07/07/18  Yes [provider]  Vitamin D, Ergocalciferol, (DRISDOL) 50000 units CAPS capsule Take 50,000 Units by mouth every 7 (seven) days.   Yes [provider]  acetaminophen (TYLENOL) 325 MG tablet Tylenol  prn    [provider]  Melatonin 3 MG TABS Take 3 mg by mouth at bedtime.    [provider]     Family History  Problem Relation Age of Onset  . Hypertension Mother   . Hypertension Father   . Diabetes Paternal Grandmother   . Cancer Paternal Grandmother        OV/UT  . Breast cancer Paternal Grandmother 87  . Breast cancer Cousin 34       MATERNAL  . Alcoholism Paternal Grandfather     Social History   Socioeconomic History  . Marital status: Married    Spouse name: Not on file  . Number of children: Not on file  . Years of education: Not on file  . Highest education level: Not on file  Occupational History  . Not on file  Tobacco Use  . Smoking status: Former Smoker    Types: Cigarettes    Quit date: 11/30/1998    Years since quitting: 21.5  . Smokeless tobacco: Never Used  Vaping Use  . Vaping Use: Never used  Substance and Sexual Activity  . Alcohol use: Not Currently    Alcohol/week: 0.0 standard drinks  . Drug use: No  . Sexual activity: Yes    Birth control/protection: Surgical    Comment: HYST.-1st intercourse 45 yo-Fewer than 5 partners  Other Topics Concern  . Not on file  Social History Narrative  . Not on file   Social Determinants of Health   Financial Resource Strain: Not on file  Food Insecurity: Not on file  Transportation Needs: Not on file  Physical Activity: Not on file  Stress: Not on file  Social Connections: Not on file    Review of Systems: A 12 point ROS discussed and pertinent positives are indicated in the HPI above.  All other systems are negative.  Review of Systems   Constitutional: Negative for appetite change and fatigue.  Respiratory: Negative for chest tightness and shortness of breath.   Gastrointestinal: Negative for abdominal pain, diarrhea, nausea and vomiting.  Musculoskeletal: Negative for back pain.  Neurological: Negative for dizziness, light-headedness and headaches.  Hematological: Negative for adenopathy. Does not bruise/bleed easily.    Vital Signs: BP 121/90   Pulse (!) 103   Temp 98.2 F (36.8 C) (Oral)   Resp 18   LMP 11/18/2010   SpO2 100%   Physical Exam Constitutional:      General: She is not in acute distress. HENT:     Mouth/Throat:     Mouth: Mucous membranes are moist.     Pharynx: Oropharynx is clear.  Cardiovascular:     Rate and Rhythm: Normal rate and regular rhythm.     Pulses: Normal pulses.     Heart sounds: Normal heart sounds.  Pulmonary:     Effort: Pulmonary effort is normal.     Breath sounds: Normal breath sounds.  Abdominal:     General: Bowel sounds are normal.     Palpations: Abdomen is soft.  Musculoskeletal:        General: Normal range of motion.     Cervical back: Normal range of motion.     Right lower leg: No edema.     Left lower leg: No edema.  Skin:    General: Skin is warm and dry.  Neurological:     Mental Status: She is alert and oriented to person, place, and time.  Psychiatric:        Mood and Affect: Mood normal.        Behavior: Behavior normal.        Thought Content: Thought content normal.        Judgment: Judgment normal.     Imaging: No results found.  Labs:  CBC: Recent Labs    02/20/20 1427 04/25/20 1433 06/06/20 1517 06/20/20 0750  WBC 6.9 4.1 6.6 4.5  HGB 10.0* 9.2* 9.3* 10.4*  HCT 31.9* 29.4* 29.3* 33.1*  PLT 340 253 355 311    COAGS: Recent Labs    02/20/20 1426  INR 1.0  APTT 31    BMP: Recent Labs    02/20/20 1427 04/25/20 1433 06/06/20 1517  NA 138 142 138  K 4.1 4.4 4.2  CL 102 111 104  CO2 25 21* 21*  GLUCOSE 74 126*  74  BUN '7 9 13  ' CALCIUM 9.6 9.2 8.7*  CREATININE 0.85 0.82 0.86  GFRNONAA >60 >60 >60  GFRAA >60  --   --     LIVER FUNCTION TESTS: Recent Labs    02/20/20 1427 04/25/20 1433 06/06/20 1517  BILITOT 0.4 <0.2* 0.3  AST 51* 29 25  ALT '29 23 13  ' ALKPHOS 104 92 79  PROT 7.5 6.6 7.3  ALBUMIN 4.3 3.7 4.1    TUMOR MARKERS: No results for input(s): AFPTM, CEA, CA199, CHROMGRNA in the last 8760 hours.  Assessment and Plan:  Vitamin B12 deficiency; anemia: Nakeitha A. Juanda Crumble, 45 year old female, presents today to the [facility] department for an image-guided bone marrow biopsy with aspiration.   Risks and benefits of this procedure were discussed with the patient including, but not limited to bleeding, infection, damage to adjacent structures or low yield requiring additional tests.  All of the questions were answered and there is agreement to proceed.  Consent signed and in chart.  Thank you for this interesting consult.  I greatly enjoyed meeting RAYCHELL HOLCOMB and look forward to participating in their care.  A copy of this report was sent to the requesting provider on this date.  Electronically Signed: Soyla Dryer, AGACNP-BC [phone] 06/20/2020, 8:18 AM   I spent a total of  30 Minutes   in face to face in clinical consultation, greater than 50% of which was counseling/coordinating care for bone marrow biopsy with aspiration.

## 2020-06-20 NOTE — Discharge Instructions (Signed)
Please call Interventional Radiology clinic [phone] with any questions or concerns. ° °You may remove your dressing and shower tomorrow. ° ° °Bone Marrow Aspiration and Bone Marrow Biopsy, Adult, Care After °This sheet gives you information about how to care for yourself after your procedure. Your health care provider may also give you more specific instructions. If you have problems or questions, contact your health care provider. °What can I expect after the procedure? °After the procedure, it is common to have: °Mild pain and tenderness. °Swelling. °Bruising. °Follow these instructions at home: °Puncture site care °Follow instructions from your health care provider about how to take care of the puncture site. Make sure you: °Wash your hands with soap and water before and after you change your bandage (dressing). If soap and water are not available, use hand sanitizer. °Change your dressing as told by your health care provider. °Check your puncture site every day for signs of infection. Check for: °More redness, swelling, or pain. °Fluid or blood. °Warmth. °Pus or a bad smell.   °Activity °Return to your normal activities as told by your health care provider. Ask your health care provider what activities are safe for you. °Do not lift anything that is heavier than 10 lb (4.5 kg), or the limit that you are told, until your health care provider says that it is safe. °Do not drive for 24 hours if you were given a sedative during your procedure. °General instructions °Take over-the-counter and prescription medicines only as told by your health care provider. °Do not take baths, swim, or use a hot tub until your health care provider approves. Ask your health care provider if you may take showers. You may only be allowed to take sponge baths. °If directed, put ice on the affected area. To do this: °Put ice in a plastic bag. °Place a towel between your skin and the bag. °Leave the ice on for 20 minutes, 2-3 times a  day. °Keep all follow-up visits as told by your health care provider. This is important.   °Contact a health care provider if: °Your pain is not controlled with medicine. °You have a fever. °You have more redness, swelling, or pain around the puncture site. °You have fluid or blood coming from the puncture site. °Your puncture site feels warm to the touch. °You have pus or a bad smell coming from the puncture site. °Summary °After the procedure, it is common to have mild pain, tenderness, swelling, and bruising. °Follow instructions from your health care provider about how to take care of the puncture site and what activities are safe for you. °Take over-the-counter and prescription medicines only as told by your health care provider. °Contact a health care provider if you have any signs of infection, such as fluid or blood coming from the puncture site. °This information is not intended to replace advice given to you by your health care provider. Make sure you discuss any questions you have with your health care provider. °Document Revised: 09/26/2018 Document Reviewed: 09/26/2018 °Elsevier Patient Education © 2021 [employer] ° ° °Moderate Conscious Sedation, Adult, Care After °This sheet gives you information about how to care for yourself after your procedure. Your health care provider may also give you more specific instructions. If you have problems or questions, contact your health care provider. °What can I expect after the procedure? °After the procedure, it is common to have: °Sleepiness for several hours. °Impaired judgment for several hours. °Difficulty with balance. °Vomiting if you eat too   can I expect after the procedure? After the procedure, it is common to have:  Sleepiness for several hours.  Impaired judgment for several hours.  Difficulty with balance.  Vomiting if you eat too soon. Follow these instructions at home: For the time period you were told by your health care provider:  Rest.  Do not participate in activities where you could fall or become injured.  Do not drive or use machinery.  Do not drink  alcohol.  Do not take sleeping pills or medicines that cause drowsiness.  Do not make important decisions or sign legal documents.  Do not take care of children on your own.      Eating and drinking  Follow the diet recommended by your health care provider.  Drink enough fluid to keep your urine pale yellow.  If you vomit: ? Drink water, juice, or soup when you can drink without vomiting. ? Make sure you have little or no nausea before eating solid foods.   General instructions  Take over-the-counter and prescription medicines only as told by your health care provider.  Have a responsible adult stay with you for the time you are told. It is important to have someone help care for you until you are awake and alert.  Do not smoke.  Keep all follow-up visits as told by your health care provider. This is important. Contact a health care provider if:  You are still sleepy or having trouble with balance after 24 hours.  You feel light-headed.  You keep feeling nauseous or you keep vomiting.  You develop a rash.  You have a fever.  You have redness or swelling around the IV site. Get help right away if:  You have trouble breathing.  You have new-onset confusion at home. Summary  After the procedure, it is common to feel sleepy, have impaired judgment, or feel nauseous if you eat too soon.  Rest after you get home. Know the things you should not do after the procedure.  Follow the diet recommended by your health care provider and drink enough fluid to keep your urine pale yellow.  Get help right away if you have trouble breathing or new-onset confusion at home. This information is not intended to replace advice given to you by your health care provider. Make sure you discuss any questions you have with your health care provider. Document Revised: 09/07/2019 Document Reviewed: 04/05/2019 Elsevier Patient Education  2021 [employer].

## 2020-06-24 ENCOUNTER — Other Ambulatory Visit: Payer: Self-pay | Admitting: Hematology and Oncology

## 2020-06-24 ENCOUNTER — Telehealth: Payer: Self-pay | Admitting: *Deleted

## 2020-06-24 DIAGNOSIS — D649 Anemia, unspecified: Secondary | ICD-10-CM

## 2020-06-24 LAB — SURGICAL PATHOLOGY

## 2020-06-24 NOTE — Telephone Encounter (Signed)
Ana Rose states [name] has been having pain radiating down her legs and up her arms since she had the BMBX on Friday. "She can't sleep because of pain" has tried Aleve with no relief.   RN notified IR. IR to call patient.  Ana Rose called back and says IR told her to follow up with Dr Lorenso Courier.  Stated that BMBX would not be the cause of continued pain.   Pt to see Dr Lorenso Courier Wednesday.

## 2020-06-25 ENCOUNTER — Other Ambulatory Visit: Payer: Self-pay | Admitting: Hematology and Oncology

## 2020-06-25 ENCOUNTER — Inpatient Hospital Stay: Payer: 59 | Attending: Hematology and Oncology | Admitting: Hematology and Oncology

## 2020-06-25 ENCOUNTER — Other Ambulatory Visit: Payer: Self-pay

## 2020-06-25 ENCOUNTER — Inpatient Hospital Stay: Payer: 59

## 2020-06-25 VITALS — BP 132/77 | HR 116 | Temp 97.4°F | Resp 18 | Ht 66.0 in | Wt 162.1 lb

## 2020-06-25 DIAGNOSIS — Z9884 Bariatric surgery status: Secondary | ICD-10-CM | POA: Diagnosis not present

## 2020-06-25 DIAGNOSIS — E538 Deficiency of other specified B group vitamins: Secondary | ICD-10-CM | POA: Diagnosis present

## 2020-06-25 DIAGNOSIS — D649 Anemia, unspecified: Secondary | ICD-10-CM | POA: Insufficient documentation

## 2020-06-25 LAB — CBC WITH DIFFERENTIAL (CANCER CENTER ONLY)
Abs Immature Granulocytes: 0.01 10*3/uL (ref 0.00–0.07)
Basophils Absolute: 0.1 10*3/uL (ref 0.0–0.1)
Basophils Relative: 2 %
Eosinophils Absolute: 0.1 10*3/uL (ref 0.0–0.5)
Eosinophils Relative: 3 %
HCT: 33.8 % — ABNORMAL LOW (ref 36.0–46.0)
Hemoglobin: 10.3 g/dL — ABNORMAL LOW (ref 12.0–15.0)
Immature Granulocytes: 0 %
Lymphocytes Relative: 29 %
Lymphs Abs: 1.1 10*3/uL (ref 0.7–4.0)
MCH: 27 pg (ref 26.0–34.0)
MCHC: 30.5 g/dL (ref 30.0–36.0)
MCV: 88.5 fL (ref 80.0–100.0)
Monocytes Absolute: 0.3 10*3/uL (ref 0.1–1.0)
Monocytes Relative: 8 %
Neutro Abs: 2.2 10*3/uL (ref 1.7–7.7)
Neutrophils Relative %: 58 %
Platelet Count: 289 10*3/uL (ref 150–400)
RBC: 3.82 MIL/uL — ABNORMAL LOW (ref 3.87–5.11)
RDW: 20.3 % — ABNORMAL HIGH (ref 11.5–15.5)
WBC Count: 3.8 10*3/uL — ABNORMAL LOW (ref 4.0–10.5)
nRBC: 0 % (ref 0.0–0.2)

## 2020-06-25 LAB — RETIC PANEL
Immature Retic Fract: 17.9 % — ABNORMAL HIGH (ref 2.3–15.9)
RBC.: 3.75 MIL/uL — ABNORMAL LOW (ref 3.87–5.11)
Retic Count, Absolute: 60.7 10*3/uL (ref 19.0–186.0)
Retic Ct Pct: 1.6 % (ref 0.4–3.1)
Reticulocyte Hemoglobin: 35.8 pg (ref >27.9)

## 2020-06-25 LAB — IRON AND TIBC
Iron: 120 ug/dL (ref 41–142)
Saturation Ratios: 42 % (ref 21–57)
TIBC: 285 ug/dL (ref 236–444)
UIBC: 166 ug/dL (ref 120–384)

## 2020-06-25 LAB — CMP (CANCER CENTER ONLY)
ALT: 34 U/L (ref 0–44)
AST: 59 U/L — ABNORMAL HIGH (ref 15–41)
Albumin: 4.2 g/dL (ref 3.5–5.0)
Alkaline Phosphatase: 87 U/L (ref 38–126)
Anion gap: 15 (ref 5–15)
BUN: 11 mg/dL (ref 6–20)
CO2: 21 mmol/L — ABNORMAL LOW (ref 22–32)
Calcium: 8.9 mg/dL (ref 8.9–10.3)
Chloride: 102 mmol/L (ref 98–111)
Creatinine: 0.8 mg/dL (ref 0.44–1.00)
GFR, Estimated: >60 mL/min (ref >60)
Glucose, Bld: 52 mg/dL — ABNORMAL LOW (ref 70–99)
Potassium: 4.1 mmol/L (ref 3.5–5.1)
Sodium: 138 mmol/L (ref 135–145)
Total Bilirubin: 0.5 mg/dL (ref 0.3–1.2)
Total Protein: 7.3 g/dL (ref 6.5–8.1)

## 2020-06-25 LAB — MAGNESIUM: Magnesium: 1.9 mg/dL (ref 1.7–2.4)

## 2020-06-25 LAB — FERRITIN: Ferritin: 25 ng/mL (ref 11–307)

## 2020-06-25 LAB — SAVE SMEAR(SSMR), FOR PROVIDER SLIDE REVIEW

## 2020-06-25 LAB — LACTATE DEHYDROGENASE: LDH: 135 U/L (ref 98–192)

## 2020-06-25 LAB — TSH: TSH: 3.236 u[IU]/mL (ref 0.308–3.960)

## 2020-06-25 MED ORDER — CYCLOBENZAPRINE HCL 5 MG PO TABS: 5.0000 mg | ORAL_TABLET | Freq: Three times a day (TID) | ORAL | 0 refills | Status: DC | PRN

## 2020-06-26 LAB — HAPTOGLOBIN: Haptoglobin: 41 mg/dL — ABNORMAL LOW (ref 42–296)

## 2020-06-26 LAB — ERYTHROPOIETIN: Erythropoietin: 39.6 m[IU]/mL — ABNORMAL HIGH (ref 2.6–18.5)

## 2020-06-27 ENCOUNTER — Telehealth: Payer: Self-pay | Admitting: Hematology and Oncology

## 2020-06-27 ENCOUNTER — Encounter (HOSPITAL_COMMUNITY): Payer: Self-pay | Admitting: Hematology and Oncology

## 2020-06-27 LAB — SURGICAL PATHOLOGY

## 2020-06-27 NOTE — Telephone Encounter (Signed)
Scheduled per 2/2 los. Called and spoke with pt, confirmed 5/2 appts  

## 2020-07-01 ENCOUNTER — Encounter: Payer: Self-pay | Admitting: Hematology and Oncology

## 2020-07-01 NOTE — Progress Notes (Signed)
[place] [phone]   [phone]  PROGRESS NOTE  Patient Care Team: Aretta Nip, MD as PCP - General (Family Medicine)  Hematological/Oncological History # Normocytic Anemia 1) 11/17/2018: Iron 39, Ferritin 8, Sat 10%, TIBC 408  2) 11/14/2019: Vitamin b12 109, Folate 13.2,  3) 12/03/2019-12/17/2019: received weekly injections of 1000 mcg of Vitamin B12. An additional dose was given on 01/21/2020 4) 01/21/2020: WBC 4.3, Hgb 9.2, MCV 82.6, Plt 350. Vitamin B12 130 (nml 180-914)   5) 02/20/2020: establish care with Dr. Lorenso Courier. WBC 6.9, Hgb 10.0, MCV 83.3, Plt 340 6) 04/25/2020: WBC 4.1, Hgb 9.2, MCV 92.2, Plt 253   Interval History:  Ana Rose 45 y.o. female with medical history significant for vitamin b12 deficiency who presents for a follow up visit. The patient's last visit was on 06/06/2020. In the interim since the last visit she has been receiving her Vitamin b12 shots administered by her spouse.   On exam today Ana Rose reports that she has had poor sleep since her last visit.  She notes that she is having some spasm and tenseness in her lower back and she "needs it to relax".  She reports that she has been trying conservative measures and has not been improving the situation.  She otherwise denies any overt signs of bleeding, bruising, or dark stools.  Her weight has been stable and she has been otherwise at her baseline level of health.  She currently denies any fevers, chills, sweats, nausea, or chest pain.  A full 10 point ROS is listed below.  MEDICAL HISTORY:  Past Medical History:  Diagnosis Date  . ADD (attention deficit disorder)   . Anemia   . Anxiety   . Bipolar disorder ([place])   . CIN I (cervical intraepithelial neoplasia I)   . Depression   . Epileptic seizures ([place])    Last seizure at 45 years of age  . GERD (gastroesophageal reflux disease)   . Heart murmur    funtional  . Hiatal hernia   . History of dysfunctional  uterine bleeding   . Hypercholesteremia 08/31/2016   Noted in note  . Hypothyroidism   . Irritable bowel syndrome (IBS)   . PONV (postoperative nausea and vomiting)   . Suicide attempt ([place]) 08/24/2012    SURGICAL HISTORY: Past Surgical History:  Procedure Laterality Date  . CHOLECYSTECTOMY  2001  . COLONOSCOPY  05/27/2017  . CYST EXCISION Right 10/24/2018   Procedure: Excision of right inner thigh sebaceous cyst;  Surgeon: Wallace Going, DO;  Location: [facility];  Service: Plastics;  Laterality: Right;  . ESOPHAGOGASTRODUODENOSCOPY  05/2012  . GASTRIC BYPASS  2010  . GYNECOLOGIC CRYOSURGERY  Approximately age 36  . LAPAROSCOPIC TOTAL HYSTERECTOMY  7.16.2012   endometriosis on fallopian tubes  . LAPAROSCOPIC UNILATERAL SALPINGECTOMY Right 10/30/2018   Procedure: LAPAROSCOPIC UNILATERAL SALPINGECTOMY;  Surgeon: [name], MD;  Location: [facility];  Service: Gynecology;  Laterality: Right;  . LAPAROSCOPIC UNILATERAL SALPINGO OOPHERECTOMY Left 10/30/2018   Procedure: LAPAROSCOPIC UNILATERAL SALPINGO OOPHORECTOMY;  Surgeon: [name], MD;  Location: [place] [facility];  Service: Gynecology;  Laterality: Left;  request 7:30am OR time in [place] [facility] block requests one hour  . TUBAL LIGATION  2001  . WISDOM TOOTH EXTRACTION      SOCIAL HISTORY: Social History   Socioeconomic History  . Marital status: Married    Spouse name: Not on file  . Number of children: Not  on file  . Years of education: Not on file  . Highest education level: Not on file  Occupational History  . Not on file  Tobacco Use  . Smoking status: Former Smoker    Types: Cigarettes    Quit date: 11/30/1998    Years since quitting: 21.6  . Smokeless tobacco: Never Used  Vaping Use  . Vaping Use: Never used  Substance and Sexual Activity  . Alcohol use: Not Currently    Alcohol/week: 0.0 standard drinks  . Drug use: No  . Sexual activity: Yes     Birth control/protection: Surgical    Comment: HYST.-1st intercourse 45 yo-Fewer than 5 partners  Other Topics Concern  . Not on file  Social History Narrative  . Not on file   Social Determinants of Health   Financial Resource Strain: Not on file  Food Insecurity: Not on file  Transportation Needs: Not on file  Physical Activity: Not on file  Stress: Not on file  Social Connections: Not on file  Intimate Partner Violence: Not on file    FAMILY HISTORY: Family History  Problem Relation Age of Onset  . Hypertension Mother   . Hypertension Father   . Diabetes Paternal Grandmother   . Cancer Paternal Grandmother        OV/UT  . Breast cancer Paternal Grandmother 60  . Breast cancer Cousin 34       MATERNAL  . Alcoholism Paternal Grandfather     ALLERGIES:  is allergic to hydrocodone, lactose intolerance (gi), nsaids, and xanax [alprazolam].  MEDICATIONS:  Current Outpatient Medications  Medication Sig Dispense Refill  . cyclobenzaprine (FLEXERIL) 5 MG tablet Take 1-2 tablets (5-10 mg total) by mouth 3 (three) times daily as needed for muscle spasms. 30 tablet 0  . acetaminophen (TYLENOL) 325 MG tablet Tylenol  prn    . cyanocobalamin (,VITAMIN B-12,) 1000 MCG/ML injection     . EQUETRO 100 MG CP12 12 hr capsule TAKE ONE CAPSULE BY MOUTH EVERY MORNING AND TAKE 2 CAPSULES BY MOUTH AT BEDTIME    . gabapentin (NEURONTIN) 300 MG capsule Take 1 capsule (300 mg total) by mouth daily. (Patient taking differently: Take 300-600 mg by mouth every 8 (eight) hours as needed (for anxiety).) 30 capsule 0  . lamoTRIgine (LAMICTAL) 200 MG tablet Take 1 tablet (200 mg total) by mouth every morning. (Patient taking differently: Take 200 mg by mouth every morning.) 30 tablet 0  . liothyronine (CYTOMEL) 25 MCG tablet liothyronine 25 mcg tablet  TAKE 1 TABLET BY MOUTH EVERY DAY (BEST TO TAKE ON AN EMPTY STOMACH)    . lisdexamfetamine (VYVANSE) 50 MG capsule Take 1 capsule (50 mg total) by mouth  every morning. 1 capsule 0  . lithium carbonate (LITHOBID) 300 MG CR tablet TAKE 1 TABLET BY MOUTH EVERYDAY AT BEDTIME    . Melatonin 3 MG TABS Take 3 mg by mouth at bedtime.    . ondansetron (ZOFRAN) 4 MG tablet Take 4 mg by mouth daily.    . pantoprazole (PROTONIX) 40 MG tablet Take 1 tablet (40 mg total) by mouth at bedtime. 30 tablet 0  . topiramate (TOPAMAX) 50 MG tablet Take 100 mg by mouth at bedtime.    . Vitamin D, Ergocalciferol, (DRISDOL) 50000 units CAPS capsule Take 50,000 Units by mouth every 7 (seven) days.     No current facility-administered medications for this visit.    REVIEW OF SYSTEMS:   Constitutional: ( - ) fevers, ( - )  chills , ( - )  night sweats Eyes: ( - ) blurriness of vision, ( - ) double vision, ( - ) watery eyes Ears, nose, mouth, throat, and face: ( - ) mucositis, ( - ) sore throat Respiratory: ( - ) cough, ( - ) dyspnea, ( - ) wheezes Cardiovascular: ( - ) palpitation, ( - ) chest discomfort, ( - ) lower extremity swelling Gastrointestinal:  ( - ) nausea, ( - ) heartburn, ( - ) change in bowel habits Skin: ( - ) abnormal skin rashes Lymphatics: ( - ) new lymphadenopathy, ( - ) easy bruising Neurological: ( - ) numbness, ( - ) tingling, ( - ) new weaknesses Behavioral/Psych: ( - ) mood change, ( - ) new changes  All other systems were reviewed with the patient and are negative.  PHYSICAL EXAMINATION:  Vitals:   06/25/20 0820  BP: 132/77  Pulse: (!) 116  Resp: 18  Temp: (!) 97.4 F (36.3 C)  SpO2: 100%   Filed Weights   06/25/20 0820  Weight: 162 lb 1.6 oz (73.5 kg)    GENERAL: well appearing middle aged Caucasian female. alert, no distress and comfortable SKIN: skin color, texture, turgor are normal, no rashes or significant lesions EYES: conjunctiva are pink and non-injected, sclera clear LUNGS: clear to auscultation and percussion with normal breathing effort HEART: regular rate & rhythm and no murmurs and no lower extremity  edema Musculoskeletal: no cyanosis of digits and no clubbing  PSYCH: alert & oriented x 3, fluent speech NEURO: no focal motor/sensory deficits  LABORATORY DATA:  I have reviewed the data as listed CBC Latest Ref Rng & Units 06/25/2020 2020/07/18 06/06/2020  WBC 4.0 - 10.5 K/uL 3.8(L) 4.5 6.6  Hemoglobin 12.0 - 15.0 g/dL 10.3(L) 10.4(L) 9.3(L)  Hematocrit 36.0 - 46.0 % 33.8(L) 33.1(L) 29.3(L)  Platelets 150 - 400 K/uL 289 311 355    CMP Latest Ref Rng & Units 06/25/2020 06/06/2020 04/25/2020  Glucose 70 - 99 mg/dL 52(L) 74 126(H)  BUN 6 - 20 mg/dL '11 13 9  ' Creatinine 0.44 - 1.00 mg/dL 0.80 0.86 0.82  Sodium 135 - 145 mmol/L 138 138 142  Potassium 3.5 - 5.1 mmol/L 4.1 4.2 4.4  Chloride 98 - 111 mmol/L 102 104 111  CO2 22 - 32 mmol/L 21(L) 21(L) 21(L)  Calcium 8.9 - 10.3 mg/dL 8.9 8.7(L) 9.2  Total Protein 6.5 - 8.1 g/dL 7.3 7.3 6.6  Total Bilirubin 0.3 - 1.2 mg/dL 0.5 0.3 <0.2(L)  Alkaline Phos 38 - 126 U/L 87 79 92  AST 15 - 41 U/L 59(H) 25 29  ALT 0 - 44 U/L 34 13 23    RADIOGRAPHIC STUDIES: CT Biopsy  Result Date: 07/18/20 INDICATION: Unexplained anemia, B12 deficiency EXAM: CT GUIDED RIGHT ILIAC BONE MARROW ASPIRATION AND CORE BIOPSY Date:  2020/07/18 Jul 18, 2020 9:29 am Radiologist:  Jerilynn Mages. Daryll Brod, MD Guidance:  CT FLUOROSCOPY TIME:  Fluoroscopy Time: NONE. MEDICATIONS: 1% lidocaine local ANESTHESIA/SEDATION: 2.5 mg IV Versed; 100 mcg IV Fentanyl Moderate Sedation Time:  10 minutes The patient was continuously monitored during the procedure by the interventional radiology nurse under my direct supervision. CONTRAST:  None. COMPLICATIONS: None PROCEDURE: Informed consent was obtained from the patient following explanation of the procedure, risks, benefits and alternatives. The patient understands, agrees and consents for the procedure. All questions were addressed. A time out was performed. The patient was positioned prone and non-contrast localization CT was performed of the pelvis to  demonstrate the iliac marrow spaces. Maximal barrier sterile technique utilized including  caps, mask, sterile gowns, sterile gloves, large sterile drape, hand hygiene, and Betadine prep. Under sterile conditions and local anesthesia, an 11 gauge coaxial bone biopsy needle was advanced into the right iliac marrow space. Needle position was confirmed with CT imaging. Initially, bone marrow aspiration was performed. Next, the 11 gauge outer cannula was utilized to obtain a right iliac bone marrow core biopsy. Needle was removed. Hemostasis was obtained with compression. The patient tolerated the procedure well. Samples were prepared with the cytotechnologist. No immediate complications. IMPRESSION: CT guided right iliac bone marrow aspiration and core biopsy. Electronically Signed   By: Jerilynn Mages.  Shick M.D.   On: 06/20/2020 09:55   CT BONE MARROW BIOPSY & ASPIRATION  Result Date: 06/20/2020 INDICATION: Unexplained anemia, B12 deficiency EXAM: CT GUIDED RIGHT ILIAC BONE MARROW ASPIRATION AND CORE BIOPSY Date:  06/20/2020 06/20/2020 9:29 am Radiologist:  M. Daryll Brod, MD Guidance:  CT FLUOROSCOPY TIME:  Fluoroscopy Time: NONE. MEDICATIONS: 1% lidocaine local ANESTHESIA/SEDATION: 2.5 mg IV Versed; 100 mcg IV Fentanyl Moderate Sedation Time:  10 minutes The patient was continuously monitored during the procedure by the interventional radiology nurse under my direct supervision. CONTRAST:  None. COMPLICATIONS: None PROCEDURE: Informed consent was obtained from the patient following explanation of the procedure, risks, benefits and alternatives. The patient understands, agrees and consents for the procedure. All questions were addressed. A time out was performed. The patient was positioned prone and non-contrast localization CT was performed of the pelvis to demonstrate the iliac marrow spaces. Maximal barrier sterile technique utilized including caps, mask, sterile gowns, sterile gloves, large sterile drape, hand hygiene,  and Betadine prep. Under sterile conditions and local anesthesia, an 11 gauge coaxial bone biopsy needle was advanced into the right iliac marrow space. Needle position was confirmed with CT imaging. Initially, bone marrow aspiration was performed. Next, the 11 gauge outer cannula was utilized to obtain a right iliac bone marrow core biopsy. Needle was removed. Hemostasis was obtained with compression. The patient tolerated the procedure well. Samples were prepared with the cytotechnologist. No immediate complications. IMPRESSION: CT guided right iliac bone marrow aspiration and core biopsy. Electronically Signed   By: Jerilynn Mages.  Shick M.D.   On: 06/20/2020 09:55    ASSESSMENT & PLAN Ana Rose 45 y.o. female with medical history significant for vitamin b12 deficiency/normocytic anemia who presents for a follow up visit.  After review the labs, the records, discussion with the patient the findings are most consistent with continued/worsening anemia.  This is concerning as we had identified the nutritional deficiency as vitamin B12.  She has received weekly shots as recommended, though the dose is not clear at this time as this was prescribed by her primary care provider is currently being ministered at home by her spouse.  Because the patient has had normalization of her vitamin B12 levels we need to consider alternative causes of her anemia.   Bone marrow biopsy was performed on 06/20/2018 and showed normocellular marrow with trilineage hematopoiesis with maturation.  The peripheral blood also only showed a normocytic anemia.  It is possible that the patient's mild appearing iron labs do truly underlay a genuine iron deficiency.  We will need to discuss this with pathology for consideration of iron staining on her bone marrow.  # Vitamin B12 deficiency  #Normocytic Anemia, worsening # Iron Deficiency Anemia #s/p Gastric Bypass in 2010 --findings were most consistent with nutritional deficiencies 2/2 to her  gastric bypass, however her hemoglobin has continued to drop despite vitamin B12  therapy --prior full nutritional evaluation with Vitamin b12 ,folate, MMA, homocysteine, copper, iron panel, and ferritin showed findings consistent with Vitamin B12 deficiency  --after extensive peripheral blood work no etiology has been found. A bone marrow biopsy also showed no clear etiology --Can consider administration of IV iron given the relatively low ferritin and the decreased iron stores within the bone marrow.  We will discuss this with the pathologist in order to assure there were no other concerning findings on the bone marrow biopsy. --RTC to be determined. Continue weekly vitamin b12 injections and assure proper dosage/administration.   Orders Placed This Encounter  Procedures  . Magnesium    Standing Status:   Future    Number of Occurrences:   1    Standing Expiration Date:   06/25/2021   All questions were answered. The patient knows to call the clinic with any problems, questions or concerns.  A total of more than 30 minutes were spent on this encounter and over half of that time was spent on counseling and coordination of care as outlined above.   Ledell Peoples, MD Department of Hematology/Oncology [place] [facility] Phone: [phone] Pager: [phone] Email: [email or web address]  07/01/2020 2:25 PM

## 2020-09-18 ENCOUNTER — Telehealth: Payer: Self-pay | Admitting: Hematology and Oncology

## 2020-09-18 NOTE — Telephone Encounter (Signed)
R/s 5/2 appt due to provider family emergency. Called and left msg about new date and time

## 2020-09-22 ENCOUNTER — Inpatient Hospital Stay: Payer: 59 | Admitting: Hematology and Oncology

## 2020-09-22 ENCOUNTER — Inpatient Hospital Stay: Payer: 59

## 2020-09-23 ENCOUNTER — Other Ambulatory Visit: Payer: Self-pay | Admitting: Hematology and Oncology

## 2020-09-23 DIAGNOSIS — E538 Deficiency of other specified B group vitamins: Secondary | ICD-10-CM

## 2020-09-23 NOTE — Progress Notes (Signed)
[place] [phone]   [phone]  PROGRESS NOTE  Patient Care Team: Aretta Nip, MD as PCP - General (Family Medicine)  Hematological/Oncological History # Normocytic Anemia 1) 11/17/2018: Iron 39, Ferritin 8, Sat 10%, TIBC 408  2) 11/14/2019: Vitamin b12 109, Folate 13.2,  3) 12/03/2019-12/17/2019: received weekly injections of 1000 mcg of Vitamin B12. An additional dose was given on 01/21/2020 4) 01/21/2020: WBC 4.3, Hgb 9.2, MCV 82.6, Plt 350. Vitamin B12 130 (nml 180-914)   5) 02/20/2020: establish care with Dr. Lorenso Courier. WBC 6.9, Hgb 10.0, MCV 83.3, Plt 340 6) 04/25/2020: WBC 4.1, Hgb 9.2, MCV 92.2, Plt 253   Interval History:  Ana Rose 45 y.o. female with medical history significant for vitamin b12 deficiency who presents for a follow up visit. The patient's last visit was on 06/25/2020. In the interim since the last visit she has been receiving her Vitamin b12 shots administered by her spouse.   On exam today Ana Rose reports she remains quite fatigued despite her increase in hemoglobin.  She notes that she has not made any major changes to her lifestyle or diet but that she did recently discontinue Vyvanse in favor of Concerta.  She is gone on 2 trips recently to [facility] and has only had major issues with fatigue.  She continues her vitamin B12 shots weekly and reports that there have been no other changes outside of the ordinary.  She recently tried to mow her grass but had to stop 4 times due to fatigue.  She otherwise denies any overt signs of bleeding, bruising, or dark stools.  Her weight has been stable and she has been otherwise at her baseline level of health.  She currently denies any fevers, chills, sweats, nausea, or chest pain.  A full 10 point ROS is listed below.  MEDICAL HISTORY:  Past Medical History:  Diagnosis Date  . ADD (attention deficit disorder)   . Anemia   . Anxiety   . Bipolar disorder ([place])   . CIN  I (cervical intraepithelial neoplasia I)   . Depression   . Epileptic seizures ([place])    Last seizure at 45 years of age  . GERD (gastroesophageal reflux disease)   . Heart murmur    funtional  . Hiatal hernia   . History of dysfunctional uterine bleeding   . Hypercholesteremia 08/31/2016   Noted in note  . Hypothyroidism   . Irritable bowel syndrome (IBS)   . PONV (postoperative nausea and vomiting)   . Suicide attempt ([place]) 08/24/2012    SURGICAL HISTORY: Past Surgical History:  Procedure Laterality Date  . CHOLECYSTECTOMY  2001  . COLONOSCOPY  05/27/2017  . CYST EXCISION Right 10/24/2018   Procedure: Excision of right inner thigh sebaceous cyst;  Surgeon: Wallace Going, DO;  Location: [place];  Service: Plastics;  Laterality: Right;  . ESOPHAGOGASTRODUODENOSCOPY  05/2012  . GASTRIC BYPASS  2010  . GYNECOLOGIC CRYOSURGERY  Approximately age 75  . LAPAROSCOPIC TOTAL HYSTERECTOMY  7.16.2012   endometriosis on fallopian tubes  . LAPAROSCOPIC UNILATERAL SALPINGECTOMY Right 10/30/2018   Procedure: LAPAROSCOPIC UNILATERAL SALPINGECTOMY;  Surgeon: [name], MD;  Location: [place];  Service: Gynecology;  Laterality: Right;  . LAPAROSCOPIC UNILATERAL SALPINGO OOPHERECTOMY Left 10/30/2018   Procedure: LAPAROSCOPIC UNILATERAL SALPINGO OOPHORECTOMY;  Surgeon: [name], MD;  Location: [place];  Service: Gynecology;  Laterality: Left;  request 7:30am OR time in [facility] block requests  one hour  . TUBAL LIGATION  2001  . WISDOM TOOTH EXTRACTION      SOCIAL HISTORY: Social History   Socioeconomic History  . Marital status: Married    Spouse name: Not on file  . Number of children: Not on file  . Years of education: Not on file  . Highest education level: Not on file  Occupational History  . Not on file  Tobacco Use  . Smoking status: Former Smoker    Types: Cigarettes    Quit date: 11/30/1998     Years since quitting: 21.8  . Smokeless tobacco: Never Used  Vaping Use  . Vaping Use: Never used  Substance and Sexual Activity  . Alcohol use: Not Currently    Alcohol/week: 0.0 standard drinks  . Drug use: No  . Sexual activity: Yes    Birth control/protection: Surgical    Comment: HYST.-1st intercourse 45 yo-Fewer than 5 partners  Other Topics Concern  . Not on file  Social History Narrative  . Not on file   Social Determinants of Health   Financial Resource Strain: Not on file  Food Insecurity: Not on file  Transportation Needs: Not on file  Physical Activity: Not on file  Stress: Not on file  Social Connections: Not on file  Intimate Partner Violence: Not on file    FAMILY HISTORY: Family History  Problem Relation Age of Onset  . Hypertension Mother   . Hypertension Father   . Diabetes Paternal Grandmother   . Cancer Paternal Grandmother        OV/UT  . Breast cancer Paternal Grandmother 60  . Breast cancer Cousin 34       MATERNAL  . Alcoholism Paternal Grandfather     ALLERGIES:  is allergic to hydrocodone, lactose intolerance (gi), nsaids, and xanax [alprazolam].  MEDICATIONS:  Current Outpatient Medications  Medication Sig Dispense Refill  . acetaminophen (TYLENOL) 325 MG tablet Tylenol  prn    . cyanocobalamin (,VITAMIN B-12,) 1000 MCG/ML injection     . EQUETRO 100 MG CP12 12 hr capsule TAKE ONE CAPSULE BY MOUTH EVERY MORNING AND TAKE 2 CAPSULES BY MOUTH AT BEDTIME    . gabapentin (NEURONTIN) 300 MG capsule Take 1 capsule (300 mg total) by mouth daily. (Patient taking differently: Take 300-600 mg by mouth every 8 (eight) hours as needed (for anxiety).) 30 capsule 0  . lamoTRIgine (LAMICTAL) 200 MG tablet Take 1 tablet (200 mg total) by mouth every morning. (Patient taking differently: Take 200 mg by mouth every morning.) 30 tablet 0  . liothyronine (CYTOMEL) 25 MCG tablet liothyronine 25 mcg tablet  TAKE 1 TABLET BY MOUTH EVERY DAY (BEST TO TAKE ON AN  EMPTY STOMACH)    . lithium carbonate (LITHOBID) 300 MG CR tablet TAKE 1 TABLET BY MOUTH EVERYDAY AT BEDTIME    . Melatonin 3 MG TABS Take 3 mg by mouth at bedtime.    . methylphenidate (CONCERTA) 36 MG PO CR tablet Concerta 36 mg tablet,extended release  TAKE 1 TABLET BY MOUTH EVERY DAY IN THE MORNING    . ondansetron (ZOFRAN) 4 MG tablet Take 4 mg by mouth daily.    . pantoprazole (PROTONIX) 40 MG tablet Take 1 tablet (40 mg total) by mouth at bedtime. 30 tablet 0  . topiramate (TOPAMAX) 50 MG tablet Take 100 mg by mouth at bedtime.    . Vitamin D, Ergocalciferol, (DRISDOL) 50000 units CAPS capsule Take 50,000 Units by mouth every 7 (seven) days.  No current facility-administered medications for this visit.    REVIEW OF SYSTEMS:   Constitutional: ( - ) fevers, ( - )  chills , ( - ) night sweats Eyes: ( - ) blurriness of vision, ( - ) double vision, ( - ) watery eyes Ears, nose, mouth, throat, and face: ( - ) mucositis, ( - ) sore throat Respiratory: ( - ) cough, ( - ) dyspnea, ( - ) wheezes Cardiovascular: ( - ) palpitation, ( - ) chest discomfort, ( - ) lower extremity swelling Gastrointestinal:  ( - ) nausea, ( - ) heartburn, ( - ) change in bowel habits Skin: ( - ) abnormal skin rashes Lymphatics: ( - ) new lymphadenopathy, ( - ) easy bruising Neurological: ( - ) numbness, ( - ) tingling, ( - ) new weaknesses Behavioral/Psych: ( - ) mood change, ( - ) new changes  All other systems were reviewed with the patient and are negative.  PHYSICAL EXAMINATION:  Vitals:   09/24/20 0902  BP: (!) 135/94  Pulse: (!) 119  Resp: 17  Temp: (!) 96.9 F (36.1 C)  SpO2: 100%   Filed Weights   09/24/20 0902  Weight: 153 lb 3.2 oz (69.5 kg)    GENERAL: well appearing middle aged Caucasian female. alert, no distress and comfortable SKIN: skin color, texture, turgor are normal, no rashes or significant lesions EYES: conjunctiva are pink and non-injected, sclera clear LUNGS: clear to  auscultation and percussion with normal breathing effort HEART: regular rate & rhythm and no murmurs and no lower extremity edema Musculoskeletal: no cyanosis of digits and no clubbing  PSYCH: alert & oriented x 3, fluent speech NEURO: no focal motor/sensory deficits  LABORATORY DATA:  I have reviewed the data as listed CBC Latest Ref Rng & Units 09/24/2020 06/25/2020 06/20/2020  WBC 4.0 - 10.5 K/uL 7.1 3.8(L) 4.5  Hemoglobin 12.0 - 15.0 g/dL 11.1(L) 10.3(L) 10.4(L)  Hematocrit 36.0 - 46.0 % 34.4(L) 33.8(L) 33.1(L)  Platelets 150 - 400 K/uL 437(H) 289 311    CMP Latest Ref Rng & Units 09/24/2020 06/25/2020 06/06/2020  Glucose 70 - 99 mg/dL 59(L) 52(L) 74  BUN 6 - 20 mg/dL '8 11 13  ' Creatinine 0.44 - 1.00 mg/dL 0.80 0.80 0.86  Sodium 135 - 145 mmol/L 142 138 138  Potassium 3.5 - 5.1 mmol/L 4.0 4.1 4.2  Chloride 98 - 111 mmol/L 107 102 104  CO2 22 - 32 mmol/L 19(L) 21(L) 21(L)  Calcium 8.9 - 10.3 mg/dL 8.6(L) 8.9 8.7(L)  Total Protein 6.5 - 8.1 g/dL 6.6 7.3 7.3  Total Bilirubin 0.3 - 1.2 mg/dL 0.4 0.5 0.3  Alkaline Phos 38 - 126 U/L 124 87 79  AST 15 - 41 U/L 66(H) 59(H) 25  ALT 0 - 44 U/L 35 34 13    RADIOGRAPHIC STUDIES: No results found.  ASSESSMENT & PLAN Ana Rose 45 y.o. female with medical history significant for vitamin b12 deficiency/normocytic anemia who presents for a follow up visit.  After review the labs, the records, discussion with the patient the findings are most consistent with continued/worsening anemia.  This is concerning as we had identified the nutritional deficiency as vitamin B12.  She has received weekly shots as recommended, though the dose is not clear at this time as this was prescribed by her primary care provider is currently being ministered at home by her spouse.  Because the patient has had normalization of her vitamin B12 levels we need to consider alternative causes of  her anemia.   Bone marrow biopsy was performed on 06/20/2018 and showed  normocellular marrow with trilineage hematopoiesis with maturation.  The peripheral blood also only showed a normocytic anemia.  It is possible that the patient's mild appearing iron labs do truly underlay a genuine iron deficiency.  We will need to discuss this with pathology for consideration of iron staining on her bone marrow.  # Vitamin B12 deficiency  #Normocytic Anemia, worsening # Iron Deficiency Anemia #s/p Gastric Bypass in 2010 --findings were most consistent with nutritional deficiencies 2/2 to her gastric bypass, however her hemoglobin has continued to drop despite vitamin B12 therapy --prior full nutritional evaluation with Vitamin b12 ,folate, MMA, homocysteine, copper, iron panel, and ferritin showed findings consistent with Vitamin B12 deficiency  --after extensive peripheral blood work no etiology has been found. A bone marrow biopsy also showed no clear etiology --will administration of IV iron given the relatively low ferritin and the decreased iron stores within the bone marrow.  Additionally the recent increase in platelets may be secondary to iron deficiency. --RTC in 6 weeks after last dose of IV iron. Continue weekly vitamin b12 injections and assure proper dosage/administration.   No orders of the defined types were placed in this encounter.  All questions were answered. The patient knows to call the clinic with any problems, questions or concerns.  A total of more than 30 minutes were spent on this encounter and over half of that time was spent on counseling and coordination of care as outlined above.   Ledell Peoples, MD Department of Hematology/Oncology [place] at [facility] Phone: [phone] Pager: [phone] Email: [email or web address]  09/24/2020 9:59 AM

## 2020-09-24 ENCOUNTER — Encounter: Payer: Self-pay | Admitting: Hematology and Oncology

## 2020-09-24 ENCOUNTER — Other Ambulatory Visit: Payer: Self-pay

## 2020-09-24 ENCOUNTER — Inpatient Hospital Stay: Payer: 59 | Attending: Hematology and Oncology

## 2020-09-24 ENCOUNTER — Inpatient Hospital Stay (HOSPITAL_BASED_OUTPATIENT_CLINIC_OR_DEPARTMENT_OTHER): Payer: 59 | Admitting: Hematology and Oncology

## 2020-09-24 VITALS — BP 135/94 | HR 119 | Temp 96.9°F | Resp 17 | Wt 153.2 lb

## 2020-09-24 DIAGNOSIS — E538 Deficiency of other specified B group vitamins: Secondary | ICD-10-CM | POA: Diagnosis not present

## 2020-09-24 DIAGNOSIS — R238 Other skin changes: Secondary | ICD-10-CM | POA: Diagnosis not present

## 2020-09-24 DIAGNOSIS — R233 Spontaneous ecchymoses: Secondary | ICD-10-CM

## 2020-09-24 DIAGNOSIS — D508 Other iron deficiency anemias: Secondary | ICD-10-CM | POA: Insufficient documentation

## 2020-09-24 DIAGNOSIS — D649 Anemia, unspecified: Secondary | ICD-10-CM | POA: Insufficient documentation

## 2020-09-24 DIAGNOSIS — Z9884 Bariatric surgery status: Secondary | ICD-10-CM | POA: Insufficient documentation

## 2020-09-24 LAB — CBC WITH DIFFERENTIAL (CANCER CENTER ONLY)
Abs Immature Granulocytes: 0.03 10*3/uL (ref 0.00–0.07)
Basophils Absolute: 0.1 10*3/uL (ref 0.0–0.1)
Basophils Relative: 1 %
Eosinophils Absolute: 0.1 10*3/uL (ref 0.0–0.5)
Eosinophils Relative: 1 %
HCT: 34.4 % — ABNORMAL LOW (ref 36.0–46.0)
Hemoglobin: 11.1 g/dL — ABNORMAL LOW (ref 12.0–15.0)
Immature Granulocytes: 0 %
Lymphocytes Relative: 31 %
Lymphs Abs: 2.2 10*3/uL (ref 0.7–4.0)
MCH: 30.2 pg (ref 26.0–34.0)
MCHC: 32.3 g/dL (ref 30.0–36.0)
MCV: 93.5 fL (ref 80.0–100.0)
Monocytes Absolute: 0.9 10*3/uL (ref 0.1–1.0)
Monocytes Relative: 12 %
Neutro Abs: 3.9 10*3/uL (ref 1.7–7.7)
Neutrophils Relative %: 55 %
Platelet Count: 437 10*3/uL — ABNORMAL HIGH (ref 150–400)
RBC: 3.68 MIL/uL — ABNORMAL LOW (ref 3.87–5.11)
RDW: 19.4 % — ABNORMAL HIGH (ref 11.5–15.5)
WBC Count: 7.1 10*3/uL (ref 4.0–10.5)
nRBC: 0 % (ref 0.0–0.2)

## 2020-09-24 LAB — CMP (CANCER CENTER ONLY)
ALT: 35 U/L (ref 0–44)
AST: 66 U/L — ABNORMAL HIGH (ref 15–41)
Albumin: 3.5 g/dL (ref 3.5–5.0)
Alkaline Phosphatase: 124 U/L (ref 38–126)
Anion gap: 16 — ABNORMAL HIGH (ref 5–15)
BUN: 8 mg/dL (ref 6–20)
CO2: 19 mmol/L — ABNORMAL LOW (ref 22–32)
Calcium: 8.6 mg/dL — ABNORMAL LOW (ref 8.9–10.3)
Chloride: 107 mmol/L (ref 98–111)
Creatinine: 0.8 mg/dL (ref 0.44–1.00)
GFR, Estimated: >60 mL/min (ref >60)
Glucose, Bld: 59 mg/dL — ABNORMAL LOW (ref 70–99)
Potassium: 4 mmol/L (ref 3.5–5.1)
Sodium: 142 mmol/L (ref 135–145)
Total Bilirubin: 0.4 mg/dL (ref 0.3–1.2)
Total Protein: 6.6 g/dL (ref 6.5–8.1)

## 2020-09-24 LAB — IRON AND TIBC
Iron: 83 ug/dL (ref 28–170)
Saturation Ratios: 42 % — ABNORMAL HIGH (ref 10.4–31.8)
TIBC: 197 ug/dL — ABNORMAL LOW (ref 250–450)
UIBC: 114 ug/dL

## 2020-09-24 LAB — FERRITIN: Ferritin: 27 ng/mL (ref 11–307)

## 2020-09-24 LAB — VITAMIN B12: Vitamin B-12: 2379 pg/mL — ABNORMAL HIGH (ref 180–914)

## 2020-09-24 LAB — FOLATE: Folate: 4.8 ng/mL — ABNORMAL LOW (ref >5.9)

## 2020-09-25 ENCOUNTER — Telehealth: Payer: Self-pay | Admitting: Hematology and Oncology

## 2020-09-25 NOTE — Telephone Encounter (Signed)
Scheduled per los. Called and left detailed msg about iron infusion being scheduled at our [facility] location. Left call back number for patient to call back

## 2020-09-30 LAB — METHYLMALONIC ACID, SERUM: Methylmalonic Acid, Quantitative: 77 nmol/L (ref 0–378)

## 2020-10-01 ENCOUNTER — Other Ambulatory Visit: Payer: Self-pay

## 2020-10-01 ENCOUNTER — Inpatient Hospital Stay: Payer: 59

## 2020-10-01 VITALS — BP 121/83 | HR 90 | Temp 98.0°F | Resp 17

## 2020-10-01 DIAGNOSIS — D649 Anemia, unspecified: Secondary | ICD-10-CM

## 2020-10-01 DIAGNOSIS — D508 Other iron deficiency anemias: Secondary | ICD-10-CM | POA: Diagnosis not present

## 2020-10-01 MED ORDER — SODIUM CHLORIDE 0.9 % IV SOLN
510.0000 mg | Freq: Once | INTRAVENOUS | Status: AC
  Administered 2020-10-01: 510 mg via INTRAVENOUS
  Filled 2020-10-01: qty 510

## 2020-10-01 MED ORDER — SODIUM CHLORIDE 0.9 % IV SOLN
Freq: Once | INTRAVENOUS | Status: AC
  Filled 2020-10-01: qty 250

## 2020-10-01 NOTE — Patient Instructions (Signed)
Ferumoxytol injection What is this medicine? FERUMOXYTOL is an iron complex. Iron is used to make healthy red blood cells, which carry oxygen and nutrients throughout the body. This medicine is used to treat iron deficiency anemia. This medicine may be used for other purposes; ask your health care provider or pharmacist if you have questions. COMMON BRAND NAME(S): Feraheme What should I tell my health care provider before I take this medicine? They need to know if you have any of these conditions:  anemia not caused by low iron levels  high levels of iron in the blood  magnetic resonance imaging (MRI) test scheduled  an unusual or allergic reaction to iron, other medicines, foods, dyes, or preservatives  pregnant or trying to get pregnant  breast-feeding How should I use this medicine? This medicine is for injection into a vein. It is given by a health care professional in a hospital or clinic setting. Talk to your pediatrician regarding the use of this medicine in children. Special care may be needed. Overdosage: If you think you have taken too much of this medicine contact a poison control center or emergency room at once. NOTE: This medicine is only for you. Do not share this medicine with others. What if I miss a dose? It is important not to miss your dose. Call your doctor or health care professional if you are unable to keep an appointment. What may interact with this medicine? This medicine may interact with the following medications:  other iron products This list may not describe all possible interactions. Give your health care provider a list of all the medicines, herbs, non-prescription drugs, or dietary supplements you use. Also tell them if you smoke, drink alcohol, or use illegal drugs. Some items may interact with your medicine. What should I watch for while using this medicine? Visit your doctor or healthcare professional regularly. Tell your doctor or healthcare  professional if your symptoms do not start to get better or if they get worse. You may need blood work done while you are taking this medicine. You may need to follow a special diet. Talk to your doctor. Foods that contain iron include: whole grains/cereals, dried fruits, beans, or peas, leafy green vegetables, and organ meats (liver, kidney). What side effects may I notice from receiving this medicine? Side effects that you should report to your doctor or health care professional as soon as possible:  allergic reactions like skin rash, itching or hives, swelling of the face, lips, or tongue  breathing problems  changes in blood pressure  feeling faint or lightheaded, falls  fever or chills  flushing, sweating, or hot feelings  swelling of the ankles or feet Side effects that usually do not require medical attention (report to your doctor or health care professional if they continue or are bothersome):  diarrhea  headache  nausea, vomiting  stomach pain This list may not describe all possible side effects. Call your doctor for medical advice about side effects. You may report side effects to FDA at 1-800-FDA-1088. Where should I keep my medicine? This drug is given in a hospital or clinic and will not be stored at home. NOTE: This sheet is a summary. It may not cover all possible information. If you have questions about this medicine, talk to your doctor, pharmacist, or health care provider.  2021 Elsevier/Gold Standard (2016-06-28 20:21:10)  

## 2020-10-08 ENCOUNTER — Inpatient Hospital Stay: Payer: 59

## 2020-10-08 ENCOUNTER — Other Ambulatory Visit: Payer: Self-pay

## 2020-10-08 VITALS — BP 123/89 | HR 99 | Temp 98.0°F | Resp 16

## 2020-10-08 DIAGNOSIS — D508 Other iron deficiency anemias: Secondary | ICD-10-CM | POA: Diagnosis not present

## 2020-10-08 DIAGNOSIS — D649 Anemia, unspecified: Secondary | ICD-10-CM

## 2020-10-08 MED ORDER — SODIUM CHLORIDE 0.9 % IV SOLN
510.0000 mg | Freq: Once | INTRAVENOUS | Status: AC
  Administered 2020-10-08: 510 mg via INTRAVENOUS
  Filled 2020-10-08: qty 17

## 2020-10-08 MED ORDER — SODIUM CHLORIDE 0.9 % IV SOLN
Freq: Once | INTRAVENOUS | Status: AC
Start: 2020-10-08 — End: 2020-10-08
  Filled 2020-10-08: qty 250

## 2020-10-08 NOTE — Patient Instructions (Signed)
Ferumoxytol injection What is this medicine? FERUMOXYTOL is an iron complex. Iron is used to make healthy red blood cells, which carry oxygen and nutrients throughout the body. This medicine is used to treat iron deficiency anemia. This medicine may be used for other purposes; ask your health care provider or pharmacist if you have questions. COMMON BRAND NAME(S): Feraheme What should I tell my health care provider before I take this medicine? They need to know if you have any of these conditions:  anemia not caused by low iron levels  high levels of iron in the blood  magnetic resonance imaging (MRI) test scheduled  an unusual or allergic reaction to iron, other medicines, foods, dyes, or preservatives  pregnant or trying to get pregnant  breast-feeding How should I use this medicine? This medicine is for injection into a vein. It is given by a health care professional in a hospital or clinic setting. Talk to your pediatrician regarding the use of this medicine in children. Special care may be needed. Overdosage: If you think you have taken too much of this medicine contact a poison control center or emergency room at once. NOTE: This medicine is only for you. Do not share this medicine with others. What if I miss a dose? It is important not to miss your dose. Call your doctor or health care professional if you are unable to keep an appointment. What may interact with this medicine? This medicine may interact with the following medications:  other iron products This list may not describe all possible interactions. Give your health care provider a list of all the medicines, herbs, non-prescription drugs, or dietary supplements you use. Also tell them if you smoke, drink alcohol, or use illegal drugs. Some items may interact with your medicine. What should I watch for while using this medicine? Visit your doctor or healthcare professional regularly. Tell your doctor or healthcare  professional if your symptoms do not start to get better or if they get worse. You may need blood work done while you are taking this medicine. You may need to follow a special diet. Talk to your doctor. Foods that contain iron include: whole grains/cereals, dried fruits, beans, or peas, leafy green vegetables, and organ meats (liver, kidney). What side effects may I notice from receiving this medicine? Side effects that you should report to your doctor or health care professional as soon as possible:  allergic reactions like skin rash, itching or hives, swelling of the face, lips, or tongue  breathing problems  changes in blood pressure  feeling faint or lightheaded, falls  fever or chills  flushing, sweating, or hot feelings  swelling of the ankles or feet Side effects that usually do not require medical attention (report to your doctor or health care professional if they continue or are bothersome):  diarrhea  headache  nausea, vomiting  stomach pain This list may not describe all possible side effects. Call your doctor for medical advice about side effects. You may report side effects to FDA at 1-800-FDA-1088. Where should I keep my medicine? This drug is given in a hospital or clinic and will not be stored at home. NOTE: This sheet is a summary. It may not cover all possible information. If you have questions about this medicine, talk to your doctor, pharmacist, or health care provider.  2021 Elsevier/Gold Standard (2016-06-28 20:21:10)  

## 2020-11-19 ENCOUNTER — Inpatient Hospital Stay: Payer: 59 | Attending: Hematology and Oncology | Admitting: Hematology and Oncology

## 2020-11-19 ENCOUNTER — Inpatient Hospital Stay: Payer: 59

## 2020-11-19 ENCOUNTER — Other Ambulatory Visit: Payer: Self-pay | Admitting: Hematology and Oncology

## 2020-11-19 ENCOUNTER — Other Ambulatory Visit: Payer: Self-pay

## 2020-11-19 VITALS — BP 126/107 | HR 112 | Temp 97.5°F | Resp 19 | Ht 66.0 in | Wt 143.2 lb

## 2020-11-19 DIAGNOSIS — D508 Other iron deficiency anemias: Secondary | ICD-10-CM

## 2020-11-19 DIAGNOSIS — G40909 Epilepsy, unspecified, not intractable, without status epilepticus: Secondary | ICD-10-CM | POA: Diagnosis not present

## 2020-11-19 DIAGNOSIS — E7800 Pure hypercholesterolemia, unspecified: Secondary | ICD-10-CM | POA: Diagnosis not present

## 2020-11-19 DIAGNOSIS — E039 Hypothyroidism, unspecified: Secondary | ICD-10-CM | POA: Diagnosis not present

## 2020-11-19 DIAGNOSIS — D649 Anemia, unspecified: Secondary | ICD-10-CM | POA: Diagnosis not present

## 2020-11-19 DIAGNOSIS — E538 Deficiency of other specified B group vitamins: Secondary | ICD-10-CM | POA: Diagnosis not present

## 2020-11-19 DIAGNOSIS — Z79899 Other long term (current) drug therapy: Secondary | ICD-10-CM | POA: Insufficient documentation

## 2020-11-19 DIAGNOSIS — D509 Iron deficiency anemia, unspecified: Secondary | ICD-10-CM | POA: Diagnosis not present

## 2020-11-19 DIAGNOSIS — K589 Irritable bowel syndrome without diarrhea: Secondary | ICD-10-CM | POA: Insufficient documentation

## 2020-11-19 DIAGNOSIS — Z9884 Bariatric surgery status: Secondary | ICD-10-CM | POA: Diagnosis not present

## 2020-11-19 LAB — CMP (CANCER CENTER ONLY)
ALT: 66 U/L — ABNORMAL HIGH (ref 0–44)
AST: 74 U/L — ABNORMAL HIGH (ref 15–41)
Albumin: 3.2 g/dL — ABNORMAL LOW (ref 3.5–5.0)
Alkaline Phosphatase: 144 U/L — ABNORMAL HIGH (ref 38–126)
Anion gap: 12 (ref 5–15)
BUN: 10 mg/dL (ref 6–20)
CO2: 23 mmol/L (ref 22–32)
Calcium: 8.9 mg/dL (ref 8.9–10.3)
Chloride: 105 mmol/L (ref 98–111)
Creatinine: 0.89 mg/dL (ref 0.44–1.00)
GFR, Estimated: >60 mL/min (ref >60)
Glucose, Bld: 93 mg/dL (ref 70–99)
Potassium: 4.2 mmol/L (ref 3.5–5.1)
Sodium: 140 mmol/L (ref 135–145)
Total Bilirubin: 0.5 mg/dL (ref 0.3–1.2)
Total Protein: 6.6 g/dL (ref 6.5–8.1)

## 2020-11-19 LAB — CBC WITH DIFFERENTIAL (CANCER CENTER ONLY)
Abs Immature Granulocytes: 0.04 10*3/uL (ref 0.00–0.07)
Basophils Absolute: 0 10*3/uL (ref 0.0–0.1)
Basophils Relative: 1 %
Eosinophils Absolute: 0.1 10*3/uL (ref 0.0–0.5)
Eosinophils Relative: 1 %
HCT: 34.5 % — ABNORMAL LOW (ref 36.0–46.0)
Hemoglobin: 11.5 g/dL — ABNORMAL LOW (ref 12.0–15.0)
Immature Granulocytes: 1 %
Lymphocytes Relative: 17 %
Lymphs Abs: 1.3 10*3/uL (ref 0.7–4.0)
MCH: 33.4 pg (ref 26.0–34.0)
MCHC: 33.3 g/dL (ref 30.0–36.0)
MCV: 100.3 fL — ABNORMAL HIGH (ref 80.0–100.0)
Monocytes Absolute: 0.5 10*3/uL (ref 0.1–1.0)
Monocytes Relative: 7 %
Neutro Abs: 5.9 10*3/uL (ref 1.7–7.7)
Neutrophils Relative %: 73 %
Platelet Count: 358 10*3/uL (ref 150–400)
RBC: 3.44 MIL/uL — ABNORMAL LOW (ref 3.87–5.11)
RDW: 15.9 % — ABNORMAL HIGH (ref 11.5–15.5)
WBC Count: 7.8 10*3/uL (ref 4.0–10.5)
nRBC: 0 % (ref 0.0–0.2)

## 2020-11-19 LAB — IRON AND TIBC
Iron: 164 ug/dL — ABNORMAL HIGH (ref 41–142)
Saturation Ratios: 102 % — ABNORMAL HIGH (ref 21–57)
TIBC: 161 ug/dL — ABNORMAL LOW (ref 236–444)
UIBC: UNDETERMINED ug/dL (ref 120–384)

## 2020-11-19 LAB — RETIC PANEL
Immature Retic Fract: 13.2 % (ref 2.3–15.9)
RBC.: 3.48 MIL/uL — ABNORMAL LOW (ref 3.87–5.11)
Retic Count, Absolute: 74.8 10*3/uL (ref 19.0–186.0)
Retic Ct Pct: 2.2 % (ref 0.4–3.1)
Reticulocyte Hemoglobin: 36.2 pg (ref >27.9)

## 2020-11-19 LAB — VITAMIN B12: Vitamin B-12: 896 pg/mL (ref 180–914)

## 2020-11-19 LAB — FERRITIN: Ferritin: 369 ng/mL — ABNORMAL HIGH (ref 11–307)

## 2020-11-19 NOTE — Progress Notes (Signed)
[place] [phone]   [phone]) [phone]  PROGRESS NOTE  Patient Care Team: Aretta Nip, MD as PCP - General (Family Medicine)  Hematological/Oncological History # Normocytic Anemia 1) 11/17/2018: Iron 39, Ferritin 8, Sat 10%, TIBC 408  2) 11/14/2019: Vitamin b12 109, Folate 13.2,  3) 12/03/2019-12/17/2019: received weekly injections of 1000 mcg of Vitamin B12. An additional dose was given on 01/21/2020 4) 01/21/2020: WBC 4.3, Hgb 9.2, MCV 82.6, Plt 350. Vitamin B12 130 (nml 180-914)   5) 02/20/2020: establish care with Dr. Lorenso Courier. WBC 6.9, Hgb 10.0, MCV 83.3, Plt 340 6) 04/25/2020: WBC 4.1, Hgb 9.2, MCV 92.2, Plt 253  7) 5/11-5/18/2022: 2 doses of IV Feraheme  Interval History:  Ana Rose 45 y.o. female with medical history significant for vitamin b12 deficiency who presents for a follow up visit. The patient's last visit was on 09/24/2020. In the interim since the last visit she received 2 doses of IV iron on 5/11 and 5/18.   On exam today Ana Rose reports she still continues to have low levels of energy.  She reports that she feels like she is actually been more focused and more motivated but still feels like there is room for improvement.  She notes that she is continue to lose weight with her weight currently having dropped down to 143.  She is that she has been drinking protein shakes and peanut butter with bananas in order to try to help bolster her weight.  She reports that the vitamin B12 shots gave her a good boost of energy typically the day after she received the injection.  She also notes that about 2 days ago she had some bright red blood in her stools that were not painful.  She also does not have any menstrual cycles having her last menstrual cycle at age 23.  She also underwent colonoscopy in 2019.  She currently denies any fevers, chills, sweats, nausea, or chest pain.  A full 10 point ROS is listed below.  MEDICAL HISTORY:  Past Medical  History:  Diagnosis Date   ADD (attention deficit disorder)    Anemia    Anxiety    Bipolar disorder (HCC)    CIN I (cervical intraepithelial neoplasia I)    Depression    Epileptic seizures ([facility])    Last seizure at 45 years of age   GERD (gastroesophageal reflux disease)    Heart murmur    funtional   Hiatal hernia    History of dysfunctional uterine bleeding    Hypercholesteremia 08/31/2016   Noted in note   Hypothyroidism    Irritable bowel syndrome (IBS)    PONV (postoperative nausea and vomiting)    Suicide attempt ([place]) 08/24/2012    SURGICAL HISTORY: Past Surgical History:  Procedure Laterality Date   CHOLECYSTECTOMY  2001   COLONOSCOPY  05/27/2017   CYST EXCISION Right 10/24/2018   Procedure: Excision of right inner thigh sebaceous cyst;  Surgeon: Wallace Going, DO;  Location: [place];  Service: Plastics;  Laterality: Right;   ESOPHAGOGASTRODUODENOSCOPY  05/2012   GASTRIC BYPASS  2010   GYNECOLOGIC CRYOSURGERY  Approximately age 47   LAPAROSCOPIC TOTAL HYSTERECTOMY  7.16.2012   endometriosis on fallopian tubes   LAPAROSCOPIC UNILATERAL SALPINGECTOMY Right 10/30/2018   Procedure: LAPAROSCOPIC UNILATERAL SALPINGECTOMY;  Surgeon: [name], MD;  Location: [facility];  Service: Gynecology;  Laterality: Right;   LAPAROSCOPIC UNILATERAL SALPINGO OOPHERECTOMY Left 10/30/2018   Procedure: LAPAROSCOPIC UNILATERAL SALPINGO OOPHORECTOMY;  Surgeon: [name], MD;  Location: [facility];  Service: Gynecology;  Laterality: Left;  request 7:30am OR time in [place] block requests one hour   TUBAL LIGATION  2001   WISDOM TOOTH EXTRACTION      SOCIAL HISTORY: Social History   Socioeconomic History   Marital status: Married    Spouse name: Not on file   Number of children: Not on file   Years of education: Not on file   Highest education level: Not on file  Occupational History   Not on file   Tobacco Use   Smoking status: Former    Pack years: 0.00    Types: Cigarettes    Quit date: 11/30/1998    Years since quitting: 21.9   Smokeless tobacco: Never  Vaping Use   Vaping Use: Never used  Substance and Sexual Activity   Alcohol use: Not Currently    Alcohol/week: 0.0 standard drinks   Drug use: No   Sexual activity: Yes    Birth control/protection: Surgical    Comment: HYST.-1st intercourse 45 yo-Fewer than 5 partners  Other Topics Concern   Not on file  Social History Narrative   Not on file   Social Determinants of Health   Financial Resource Strain: Not on file  Food Insecurity: Not on file  Transportation Needs: Not on file  Physical Activity: Not on file  Stress: Not on file  Social Connections: Not on file  Intimate Partner Violence: Not on file    FAMILY HISTORY: Family History  Problem Relation Age of Onset   Hypertension Mother    Hypertension Father    Diabetes Paternal Grandmother    Cancer Paternal Grandmother        OV/UT   Breast cancer Paternal Grandmother 53   Breast cancer Cousin 56       MATERNAL   Alcoholism Paternal Grandfather     ALLERGIES:  is allergic to hydrocodone, lactose intolerance (gi), nsaids, and xanax [alprazolam].  MEDICATIONS:  Current Outpatient Medications  Medication Sig Dispense Refill   acetaminophen (TYLENOL) 325 MG tablet Tylenol  prn     cyanocobalamin (,VITAMIN B-12,) 1000 MCG/ML injection      EQUETRO 100 MG CP12 12 hr capsule TAKE ONE CAPSULE BY MOUTH EVERY MORNING AND TAKE 2 CAPSULES BY MOUTH AT BEDTIME     gabapentin (NEURONTIN) 300 MG capsule Take 1 capsule (300 mg total) by mouth daily. (Patient taking differently: Take 300-600 mg by mouth every 8 (eight) hours as needed (for anxiety).) 30 capsule 0   lamoTRIgine (LAMICTAL) 200 MG tablet Take 1 tablet (200 mg total) by mouth every morning. (Patient taking differently: Take 200 mg by mouth every morning.) 30 tablet 0   lisdexamfetamine (VYVANSE) 50 MG  capsule Take 50 mg by mouth daily. In the morning     lithium carbonate (LITHOBID) 300 MG CR tablet TAKE 1 TABLET BY MOUTH EVERYDAY AT BEDTIME     Melatonin 3 MG TABS Take 3 mg by mouth at bedtime.     ondansetron (ZOFRAN) 4 MG tablet Take 4 mg by mouth daily.     pantoprazole (PROTONIX) 40 MG tablet Take 1 tablet (40 mg total) by mouth at bedtime. (Patient taking differently: Take 40 mg by mouth 2 (two) times daily. One in the morning and once in the evening.) 30 tablet 0   topiramate (TOPAMAX) 50 MG tablet Take 100 mg by mouth at bedtime.     Vitamin D, Ergocalciferol, (DRISDOL) 50000 units  CAPS capsule Take 50,000 Units by mouth every 7 (seven) days.     No current facility-administered medications for this visit.    REVIEW OF SYSTEMS:   Constitutional: ( - ) fevers, ( - )  chills , ( - ) night sweats Eyes: ( - ) blurriness of vision, ( - ) double vision, ( - ) watery eyes Ears, nose, mouth, throat, and face: ( - ) mucositis, ( - ) sore throat Respiratory: ( - ) cough, ( - ) dyspnea, ( - ) wheezes Cardiovascular: ( - ) palpitation, ( - ) chest discomfort, ( - ) lower extremity swelling Gastrointestinal:  ( - ) nausea, ( - ) heartburn, ( - ) change in bowel habits Skin: ( - ) abnormal skin rashes Lymphatics: ( - ) new lymphadenopathy, ( - ) easy bruising Neurological: ( - ) numbness, ( - ) tingling, ( - ) new weaknesses Behavioral/Psych: ( - ) mood change, ( - ) new changes  All other systems were reviewed with the patient and are negative.  PHYSICAL EXAMINATION:  Vitals:   11/19/20 0942 11/19/20 0945  BP: (!) 154/107 (!) 126/107  Pulse: (!) 112   Resp: 19   Temp: (!) 97.5 F (36.4 C)   SpO2: 100%    Filed Weights   11/19/20 0942  Weight: 143 lb 3.2 oz (65 kg)    GENERAL: well appearing middle aged Caucasian female. alert, no distress and comfortable SKIN: skin color, texture, turgor are normal, no rashes or significant lesions EYES: conjunctiva are pink and non-injected,  sclera clear LUNGS: clear to auscultation and percussion with normal breathing effort HEART: regular rate & rhythm and no murmurs and no lower extremity edema Musculoskeletal: no cyanosis of digits and no clubbing  PSYCH: alert & oriented x 3, fluent speech NEURO: no focal motor/sensory deficits  LABORATORY DATA:  I have reviewed the data as listed CBC Latest Ref Rng & Units 11/19/2020 09/24/2020 06/25/2020  WBC 4.0 - 10.5 K/uL 7.8 7.1 3.8(L)  Hemoglobin 12.0 - 15.0 g/dL 11.5(L) 11.1(L) 10.3(L)  Hematocrit 36.0 - 46.0 % 34.5(L) 34.4(L) 33.8(L)  Platelets 150 - 400 K/uL 358 437(H) 289    CMP Latest Ref Rng & Units 11/19/2020 09/24/2020 06/25/2020  Glucose 70 - 99 mg/dL 93 59(L) 52(L)  BUN 6 - 20 mg/dL '10 8 11  ' Creatinine 0.44 - 1.00 mg/dL 0.89 0.80 0.80  Sodium 135 - 145 mmol/L 140 142 138  Potassium 3.5 - 5.1 mmol/L 4.2 4.0 4.1  Chloride 98 - 111 mmol/L 105 107 102  CO2 22 - 32 mmol/L 23 19(L) 21(L)  Calcium 8.9 - 10.3 mg/dL 8.9 8.6(L) 8.9  Total Protein 6.5 - 8.1 g/dL 6.6 6.6 7.3  Total Bilirubin 0.3 - 1.2 mg/dL 0.5 0.4 0.5  Alkaline Phos 38 - 126 U/L 144(H) 124 87  AST 15 - 41 U/L 74(H) 66(H) 59(H)  ALT 0 - 44 U/L 66(H) 35 34    RADIOGRAPHIC STUDIES: No results found.  ASSESSMENT & PLAN Ana Rose 45 y.o. female with medical history significant for vitamin b12 deficiency/normocytic anemia who presents for a follow up visit.  After review the labs, the records, discussion with the patient the findings are most consistent with improving anemia.  The patient responded modestly with IM vitamin B12 treatment, however she had a more robust response with IV iron therapy.  At this time her findings are most consistent with anemia secondary to her gastric bypass surgery.  Due to relatively mild deficiencies in  these further work-up was pursued with a bone marrow biopsy.  Bone marrow biopsy was performed on 06/20/2018 and showed normocellular marrow with trilineage hematopoiesis with  maturation.  The peripheral blood also only showed a normocytic anemia.  It is possible that the patient's mild appearing iron labs do truly underlay a genuine iron deficiency.    # Vitamin B12 deficiency  #Normocytic Anemia, improving # Iron Deficiency Anemia #s/p Gastric Bypass in 2010 --findings were most consistent with nutritional deficiencies 2/2 to her gastric bypass, however her hemoglobin has continued to drop despite vitamin B12 therapy --prior full nutritional evaluation with Vitamin b12 ,folate, MMA, homocysteine, copper, iron panel, and ferritin showed findings consistent with Vitamin B12 deficiency and mild iron deficiency --after extensive peripheral blood work no etiology has been found. A bone marrow biopsy also showed no clear etiology --administered IV iron given the relatively low ferritin and the decreased iron stores within the bone marrow.   --Hgb responded with increase to Hgb 11.5 (from 10.3 on 06/25/2020) --RTC in 12 weeks or sooner if more iron therapy is required.   No orders of the defined types were placed in this encounter.  All questions were answered. The patient knows to call the clinic with any problems, questions or concerns.  A total of more than 30 minutes were spent on this encounter and over half of that time was spent on counseling and coordination of care as outlined above.   Ledell Peoples, MD Department of Hematology/Oncology [place] at [facility] Phone: [phone] Pager: [phone] Email: [email or web address]  11/19/2020 6:46 PM

## 2020-11-20 ENCOUNTER — Telehealth: Payer: Self-pay | Admitting: *Deleted

## 2020-11-20 NOTE — Telephone Encounter (Signed)
Patient called and left message in voicemail requesting a call back. I called to back.

## 2020-11-23 LAB — METHYLMALONIC ACID, SERUM: Methylmalonic Acid, Quantitative: 146 nmol/L (ref 0–378)

## 2020-11-26 ENCOUNTER — Telehealth: Payer: Self-pay | Admitting: Hematology and Oncology

## 2020-11-26 ENCOUNTER — Telehealth: Payer: Self-pay

## 2020-11-26 ENCOUNTER — Encounter: Payer: Self-pay | Admitting: Hematology and Oncology

## 2020-11-26 NOTE — Telephone Encounter (Signed)
Patient called with odor and "burning with urination". However, upon discussion she said it is her labia that burns when she urinates.   She does have some urinary symptoms complaining of not being able to urinate when she feels like she should and frequency.  Appointment recommended and scheduled for tomorrow morning.

## 2020-11-26 NOTE — Telephone Encounter (Signed)
Sch per 7/6 los, left message.

## 2020-11-26 NOTE — Telephone Encounter (Signed)
Patient called back and spoke with Juliann Pulse see telephone encounter on 11/26/20

## 2020-11-27 ENCOUNTER — Encounter: Payer: Self-pay | Admitting: Obstetrics & Gynecology

## 2020-11-27 ENCOUNTER — Ambulatory Visit: Payer: 59 | Admitting: Obstetrics & Gynecology

## 2020-11-27 ENCOUNTER — Other Ambulatory Visit: Payer: Self-pay

## 2020-11-27 VITALS — BP 110/72 | HR 109 | Temp 99.2°F | Ht 65.75 in | Wt 143.0 lb

## 2020-11-27 DIAGNOSIS — N951 Menopausal and female climacteric states: Secondary | ICD-10-CM | POA: Diagnosis not present

## 2020-11-27 DIAGNOSIS — N898 Other specified noninflammatory disorders of vagina: Secondary | ICD-10-CM

## 2020-11-27 DIAGNOSIS — R350 Frequency of micturition: Secondary | ICD-10-CM | POA: Diagnosis not present

## 2020-11-27 DIAGNOSIS — Z113 Encounter for screening for infections with a predominantly sexual mode of transmission: Secondary | ICD-10-CM | POA: Diagnosis not present

## 2020-11-27 DIAGNOSIS — Z79890 Hormone replacement therapy (postmenopausal): Secondary | ICD-10-CM

## 2020-11-27 LAB — WET PREP FOR TRICH, YEAST, CLUE

## 2020-11-27 MED ORDER — FLUCONAZOLE 150 MG PO TABS: 150.0000 mg | ORAL_TABLET | Freq: Every day | ORAL | 2 refills | Status: AC

## 2020-11-27 MED ORDER — ESTRADIOL 0.05 MG/24HR TD PTTW: 1.0000 | MEDICATED_PATCH | TRANSDERMAL | 4 refills | Status: DC

## 2020-11-27 MED ORDER — TINIDAZOLE 500 MG PO TABS: 1000.0000 mg | ORAL_TABLET | Freq: Two times a day (BID) | ORAL | 0 refills | Status: AC

## 2020-11-27 MED ORDER — SULFAMETHOXAZOLE-TRIMETHOPRIM 800-160 MG PO TABS: 1.0000 | ORAL_TABLET | Freq: Two times a day (BID) | ORAL | 0 refills | Status: AC

## 2020-11-27 NOTE — Progress Notes (Signed)
    Ana Rose 06-12-1975 [id number]        45 y.o.  G1P1L1 Married (has a wife).  Daughter is 12 Nurse in [place].  RP: Vaginal d/c with odor/Urinary frequency  HPI: S/P Total Hysterectomy. Vaginal d/c with odor/Urinary frequency.  Complaints of hot flashes and night sweats as well as emotional swings.  Would like STI screen.  No pelvic pain.  No fever.    OB History  Gravida Para Term Preterm AB Living  1 1 1     1   SAB IAB Ectopic Multiple Live Births               # Outcome Date GA Lbr Len/2nd Weight Sex Delivery Anes PTL Lv  1 Term             Past medical history,surgical history, problem list, medications, allergies, family history and social history were all reviewed and documented in the EPIC chart.   Directed ROS with pertinent positives and negatives documented in the history of present illness/assessment and plan.  Exam:  Vitals:   11/27/20 0918  BP: 110/72  Pulse: (!) 109  Temp: 99.2 F (37.3 C)  TempSrc: Oral  Weight: 143 lb (64.9 kg)  Height: 5' 5.75" (1.67 m)   General appearance:  Normal  Abdomen: Normal  Gynecologic exam: Vulva normal.  Speculum:  Vagina normal.  Increased vaginal discharge with odor.  Wet prep done.  Gono-Chlam done.  Wet prep: Clue cells present with odor U/A:  Clue cells present.  Pending U. Culture   Assessment/Plan:  45 y.o. G1P1001   1. Vaginal odor Bacterial vaginosis confirmed by wet prep and clue cells present in urine.  Decision to treat with tinidazole 2 tablets per mouth twice a day for 2 days.  Usage reviewed and prescription sent to pharmacy. - WET PREP FOR [place], YEAST, CLUE  2. Screen for STD (sexually transmitted disease) - HIV antibody (with reflex) - RPR - Hepatitis B Surface AntiGEN - Hepatitis C Antibody - C. trachomatis/N. gonorrhoeae RNA  3. Urinary frequency Urine analysis positive for clue cells.  Otherwise within normal limits.  Will treat bacterial vaginosis.  Pending urine culture. -  Urinalysis,Complete w/RFL Culture  4. Menopausal syndrome Severe symptoms of menopause present with hot flashes and night sweats, as well as emotional swings.  Status post total hysterectomy.  Will do an [facility] today.  Counseling on hormone replacement therapy done.  No contraindication to estradiol therapy.  Risks and benefits thoroughly reviewed.  Decision to start on estradiol patch 0.05 twice a week.  Usage reviewed and prescription sent to pharmacy. - [facility]  Other orders - tinidazole (TINDAMAX) 500 MG tablet; Take 2 tablets (1,000 mg total) by mouth 2 (two) times daily for 2 days. - fluconazole (DIFLUCAN) 150 MG tablet; Take 1 tablet (150 mg total) by mouth daily for 3 days. - sulfamethoxazole-trimethoprim (BACTRIM DS) 800-160 MG tablet; Take 1 tablet by mouth 2 (two) times daily for 3 days. - estradiol (VIVELLE-DOT) 0.05 MG/24HR patch; Place 1 patch (0.05 mg total) onto the skin 2 (two) times a week.   Princess Bruins MD, 10:24 AM 11/27/2020

## 2020-11-28 LAB — C. TRACHOMATIS/N. GONORRHOEAE RNA
C. trachomatis RNA, TMA: NOT DETECTED
N. gonorrhoeae RNA, TMA: NOT DETECTED

## 2020-11-28 LAB — HEPATITIS C ANTIBODY
Hepatitis C Ab: NONREACTIVE
SIGNAL TO CUT-OFF: 0.01 (ref <1.00)

## 2020-11-28 LAB — HIV ANTIBODY (ROUTINE TESTING W REFLEX): HIV 1&2 Ab, 4th Generation: NONREACTIVE

## 2020-11-28 LAB — HEPATITIS B SURFACE ANTIGEN: Hepatitis B Surface Ag: NONREACTIVE

## 2020-11-28 LAB — RPR: RPR Ser Ql: NONREACTIVE

## 2020-11-28 LAB — FOLLICLE STIMULATING HORMONE: FSH: 81 m[IU]/mL

## 2020-11-29 LAB — URINALYSIS, COMPLETE W/RFL CULTURE
Bilirubin Urine: NEGATIVE
Glucose, UA: NEGATIVE
Hgb urine dipstick: NEGATIVE
Hyaline Cast: NONE SEEN /LPF
Ketones, ur: NEGATIVE
Nitrites, Initial: NEGATIVE
Protein, ur: NEGATIVE
RBC / HPF: NONE SEEN /HPF (ref 0–2)
Specific Gravity, Urine: 1.015 (ref 1.001–1.035)
pH: 6.5 (ref 5.0–8.0)

## 2020-11-29 LAB — URINE CULTURE
MICRO NUMBER:: 12091831
SPECIMEN QUALITY:: ADEQUATE

## 2020-11-29 LAB — CULTURE INDICATED

## 2020-11-30 ENCOUNTER — Encounter: Payer: Self-pay | Admitting: Obstetrics & Gynecology

## 2021-02-04 ENCOUNTER — Other Ambulatory Visit (HOSPITAL_COMMUNITY)
Admission: RE | Admit: 2021-02-04 | Discharge: 2021-02-04 | Disposition: A | Discharge status: Home / Self Care | Payer: 59 | Source: Ambulatory Visit | Attending: Obstetrics & Gynecology | Admitting: Obstetrics & Gynecology

## 2021-02-04 ENCOUNTER — Other Ambulatory Visit: Payer: Self-pay

## 2021-02-04 ENCOUNTER — Encounter: Payer: Self-pay | Admitting: Obstetrics & Gynecology

## 2021-02-04 ENCOUNTER — Ambulatory Visit (INDEPENDENT_AMBULATORY_CARE_PROVIDER_SITE_OTHER): Payer: 59 | Admitting: Obstetrics & Gynecology

## 2021-02-04 VITALS — BP 120/80 | HR 117 | Resp 16 | Ht 65.75 in | Wt 143.0 lb

## 2021-02-04 DIAGNOSIS — N951 Menopausal and female climacteric states: Secondary | ICD-10-CM | POA: Diagnosis not present

## 2021-02-04 DIAGNOSIS — Z1272 Encounter for screening for malignant neoplasm of vagina: Secondary | ICD-10-CM | POA: Insufficient documentation

## 2021-02-04 DIAGNOSIS — F319 Bipolar disorder, unspecified: Secondary | ICD-10-CM | POA: Diagnosis not present

## 2021-02-04 DIAGNOSIS — Z01419 Encounter for gynecological examination (general) (routine) without abnormal findings: Secondary | ICD-10-CM | POA: Insufficient documentation

## 2021-02-04 DIAGNOSIS — Z90710 Acquired absence of both cervix and uterus: Secondary | ICD-10-CM

## 2021-02-04 NOTE — Progress Notes (Signed)
Ana Rose [birth year] [id number]   History:    45 y.o. G1P1L1 Separated from her wife.  Daughter is 21 Nurse in [place].   RP:  Established patient presenting for Annual Gyn exam   HPI: S/P Total Hysterectomy.  Postmenopause.  Decided to stop the Estradiol patch at this time.  No pelvic pain.  Breasts normal.  Urine/BMs normal.  Just separated from her 29 yr wife.  Increased anxiety.  No major depression/not suicidal. Followed by Psychiatrist.  BMI 23.26.  Yoga, meditation.  Health labs with Fam MD.  Past medical history,surgical history, family history and social history were all reviewed and documented in the EPIC chart.  Gynecologic History Patient's last menstrual period was 11/18/2010.  Obstetric History OB History  Gravida Para Term Preterm AB Living  '1 1 1     1  '$ SAB IAB Ectopic Multiple Live Births               # Outcome Date GA Lbr Len/2nd Weight Sex Delivery Anes PTL Lv  1 Term              ROS: A ROS was performed and pertinent positives and negatives are included in the history.  GENERAL: No fevers or chills. HEENT: No change in vision, no earache, sore throat or sinus congestion. NECK: No pain or stiffness. CARDIOVASCULAR: No chest pain or pressure. No palpitations. PULMONARY: No shortness of breath, cough or wheeze. GASTROINTESTINAL: No abdominal pain, nausea, vomiting or diarrhea, melena or bright red blood per rectum. GENITOURINARY: No urinary frequency, urgency, hesitancy or dysuria. MUSCULOSKELETAL: No joint or muscle pain, no back pain, no recent trauma. DERMATOLOGIC: No rash, no itching, no lesions. ENDOCRINE: No polyuria, polydipsia, no heat or cold intolerance. No recent change in weight. HEMATOLOGICAL: No anemia or easy bruising or bleeding. NEUROLOGIC: No headache, seizures, numbness, tingling or weakness. PSYCHIATRIC: No depression, no loss of interest in normal activity or change in sleep pattern.     Exam:   BP 120/80   Pulse (!) 117   Resp 16   Ht  5' 5.75" (1.67 m)   Wt 143 lb (64.9 kg)   LMP 11/18/2010   BMI 23.26 kg/m   Body mass index is 23.26 kg/m.  General appearance : Well developed well nourished female. No acute distress HEENT: Eyes: no retinal hemorrhage or exudates,  Neck supple, trachea midline, no carotid bruits, no thyroidmegaly Lungs: Clear to auscultation, no rhonchi or wheezes, or rib retractions  Heart: Regular rate and rhythm, no murmurs or gallops Breast:Examined in sitting and supine position were symmetrical in appearance, no palpable masses or tenderness,  no skin retraction, no nipple inversion, no nipple discharge, no skin discoloration, no axillary or supraclavicular lymphadenopathy Abdomen: no palpable masses or tenderness, no rebound or guarding Extremities: no edema or skin discoloration or tenderness  Pelvic: Vulva: Normal             Vagina: No gross lesions or discharge.  Pap reflex done.  Cervix/Uterus absent  Adnexa  Without masses or tenderness  Anus: Normal   Assessment/Plan:  45 y.o. female for annual exam   1. Encounter for Papanicolaou smear of vagina as part of routine gynecological examination Gynecologic exam status post total hysterectomy.  Pap reflex done on the vaginal vault.  Breast exam normal.  Last screening mammogram August 2021 was negative, will schedule screening mammogram now.  Good body mass index at 23.26.  Continue with fitness and healthy nutrition.  Health labs with family  physician. - Cytology - PAP( [place])  2. S/P TAH (total abdominal hysterectomy)  3. Menopausal syndrome Stopped the Estradiol patch recently.  Will contact me if decides to restart.  Vitamin D supplements, calcium intake of 1.5 g/day total and regular weightbearing physical activity is recommended.  4. Bipolar affective disorder, remission status unspecified ([employer]) Going through a difficult separation.  Followed closely by psychiatrist.  Other orders - lamoTRIgine (LAMICTAL) 25 MG tablet; 50  mg.   Princess Bruins MD, 8:25 AM 02/04/2021

## 2021-02-05 ENCOUNTER — Encounter: Payer: Self-pay | Admitting: Obstetrics & Gynecology

## 2021-02-09 LAB — CYTOLOGY - PAP: Diagnosis: NEGATIVE

## 2021-02-12 ENCOUNTER — Encounter: Payer: Self-pay | Admitting: Obstetrics & Gynecology

## 2021-02-20 ENCOUNTER — Inpatient Hospital Stay (HOSPITAL_BASED_OUTPATIENT_CLINIC_OR_DEPARTMENT_OTHER): Payer: 59 | Admitting: Hematology and Oncology

## 2021-02-20 ENCOUNTER — Other Ambulatory Visit: Payer: Self-pay

## 2021-02-20 ENCOUNTER — Other Ambulatory Visit: Payer: Self-pay | Admitting: Hematology and Oncology

## 2021-02-20 ENCOUNTER — Inpatient Hospital Stay: Payer: 59 | Attending: Hematology and Oncology

## 2021-02-20 VITALS — BP 134/96 | HR 104 | Temp 97.1°F | Resp 18 | Ht 65.75 in | Wt 140.0 lb

## 2021-02-20 DIAGNOSIS — D508 Other iron deficiency anemias: Secondary | ICD-10-CM

## 2021-02-20 DIAGNOSIS — Z9884 Bariatric surgery status: Secondary | ICD-10-CM | POA: Diagnosis not present

## 2021-02-20 DIAGNOSIS — D649 Anemia, unspecified: Secondary | ICD-10-CM | POA: Diagnosis not present

## 2021-02-20 DIAGNOSIS — E538 Deficiency of other specified B group vitamins: Secondary | ICD-10-CM | POA: Insufficient documentation

## 2021-02-20 DIAGNOSIS — R238 Other skin changes: Secondary | ICD-10-CM | POA: Diagnosis not present

## 2021-02-20 DIAGNOSIS — R233 Spontaneous ecchymoses: Secondary | ICD-10-CM

## 2021-02-20 LAB — CMP (CANCER CENTER ONLY)
ALT: 19 U/L (ref 0–44)
AST: 35 U/L (ref 15–41)
Albumin: 3.6 g/dL (ref 3.5–5.0)
Alkaline Phosphatase: 105 U/L (ref 38–126)
Anion gap: 11 (ref 5–15)
BUN: 10 mg/dL (ref 6–20)
CO2: 24 mmol/L (ref 22–32)
Calcium: 9 mg/dL (ref 8.9–10.3)
Chloride: 105 mmol/L (ref 98–111)
Creatinine: 0.84 mg/dL (ref 0.44–1.00)
GFR, Estimated: >60 mL/min (ref >60)
Glucose, Bld: 96 mg/dL (ref 70–99)
Potassium: 3.7 mmol/L (ref 3.5–5.1)
Sodium: 140 mmol/L (ref 135–145)
Total Bilirubin: 0.6 mg/dL (ref 0.3–1.2)
Total Protein: 6.4 g/dL — ABNORMAL LOW (ref 6.5–8.1)

## 2021-02-20 LAB — IRON AND TIBC
Iron: 134 ug/dL (ref 41–142)
Saturation Ratios: 68 % — ABNORMAL HIGH (ref 21–57)
TIBC: 196 ug/dL — ABNORMAL LOW (ref 236–444)
UIBC: 62 ug/dL — ABNORMAL LOW (ref 120–384)

## 2021-02-20 LAB — CBC WITH DIFFERENTIAL (CANCER CENTER ONLY)
Abs Immature Granulocytes: 0 10*3/uL (ref 0.00–0.07)
Basophils Absolute: 0 10*3/uL (ref 0.0–0.1)
Basophils Relative: 1 %
Eosinophils Absolute: 0 10*3/uL (ref 0.0–0.5)
Eosinophils Relative: 1 %
HCT: 36.7 % (ref 36.0–46.0)
Hemoglobin: 12.1 g/dL (ref 12.0–15.0)
Immature Granulocytes: 0 %
Lymphocytes Relative: 39 %
Lymphs Abs: 1.2 10*3/uL (ref 0.7–4.0)
MCH: 32.6 pg (ref 26.0–34.0)
MCHC: 33 g/dL (ref 30.0–36.0)
MCV: 98.9 fL (ref 80.0–100.0)
Monocytes Absolute: 0.4 10*3/uL (ref 0.1–1.0)
Monocytes Relative: 14 %
Neutro Abs: 1.4 10*3/uL — ABNORMAL LOW (ref 1.7–7.7)
Neutrophils Relative %: 45 %
Platelet Count: 239 10*3/uL (ref 150–400)
RBC: 3.71 MIL/uL — ABNORMAL LOW (ref 3.87–5.11)
RDW: 13.9 % (ref 11.5–15.5)
WBC Count: 3.1 10*3/uL — ABNORMAL LOW (ref 4.0–10.5)
nRBC: 0 % (ref 0.0–0.2)

## 2021-02-20 LAB — RETIC PANEL
Immature Retic Fract: 8.8 % (ref 2.3–15.9)
RBC.: 3.61 MIL/uL — ABNORMAL LOW (ref 3.87–5.11)
Retic Count, Absolute: 48 10*3/uL (ref 19.0–186.0)
Retic Ct Pct: 1.3 % (ref 0.4–3.1)
Reticulocyte Hemoglobin: 36.5 pg (ref >27.9)

## 2021-02-20 LAB — FERRITIN: Ferritin: 115 ng/mL (ref 11–307)

## 2021-02-20 LAB — VITAMIN B12: Vitamin B-12: 605 pg/mL (ref 180–914)

## 2021-02-20 NOTE — Progress Notes (Signed)
[place] [phone]   [phone]  PROGRESS NOTE  Patient Care Team: Aretta Nip, MD as PCP - General (Family Medicine)  Hematological/Oncological History # Normocytic Anemia 1) 11/17/2018: Iron 39, Ferritin 8, Sat 10%, TIBC 408  2) 11/14/2019: Vitamin b12 109, Folate 13.2,  3) 12/03/2019-12/17/2019: received weekly injections of 1000 mcg of Vitamin B12. An additional dose was given on 01/21/2020 4) 01/21/2020: WBC 4.3, Hgb 9.2, MCV 82.6, Plt 350. Vitamin B12 130 (nml 180-914)   5) 02/20/2020: establish care with Dr. Lorenso Courier. WBC 6.9, Hgb 10.0, MCV 83.3, Plt 340 6) 04/25/2020: WBC 4.1, Hgb 9.2, MCV 92.2, Plt 253  7) 5/11-5/18/2022: 2 doses of IV Feraheme 8) 02/20/2021: WBC 3.1, Hgb 12.1, MCV 98.9, Plt 239  Interval History:  Ana Rose 45 y.o. female with medical history significant for vitamin b12 deficiency who presents for a follow up visit. The patient's last visit was on 11/19/2020.   On exam today Ana Rose reports she has been well in the interim since her last visit.  She notes that her energy levels have been good and she is not having any issues with shortness of breath.  She also denies any overt signs of bleeding.  She continues her vitamin B12 shots weekly.  She notes that she is not having the opposite problem and is actually having trouble with insomnia.  She otherwise has noticed no major changes in her health since her last visit.  She currently denies any fevers, chills, sweats, nausea, or chest pain.  A full 10 point ROS is listed below.  MEDICAL HISTORY:  Past Medical History:  Diagnosis Date   ADD (attention deficit disorder)    Anemia    Anxiety    Bipolar disorder ([place])    CIN I (cervical intraepithelial neoplasia I)    Depression    Diabetes ([place])    Epileptic seizures ([place])    Last seizure at 45 years of age   GERD (gastroesophageal reflux disease)    Heart murmur    funtional   Hiatal hernia    History of  dysfunctional uterine bleeding    Hypercholesteremia 08/31/2016   Noted in note   Hypothyroidism    Irritable bowel syndrome (IBS)    PONV (postoperative nausea and vomiting)    Suicide attempt ([place]) 08/24/2012    SURGICAL HISTORY: Past Surgical History:  Procedure Laterality Date   CHOLECYSTECTOMY  2001   COLONOSCOPY  05/27/2017   CYST EXCISION Right 10/24/2018   Procedure: Excision of right inner thigh sebaceous cyst;  Surgeon: Wallace Going, DO;  Location: [facility];  Service: Plastics;  Laterality: Right;   ESOPHAGOGASTRODUODENOSCOPY  05/2012   GASTRIC BYPASS  2010   GYNECOLOGIC CRYOSURGERY  Approximately age 41   LAPAROSCOPIC TOTAL HYSTERECTOMY  7.16.2012   endometriosis on fallopian tubes   LAPAROSCOPIC UNILATERAL SALPINGECTOMY Right 10/30/2018   Procedure: LAPAROSCOPIC UNILATERAL SALPINGECTOMY;  Surgeon: [name], MD;  Location: [facility];  Service: Gynecology;  Laterality: Right;   LAPAROSCOPIC UNILATERAL SALPINGO OOPHERECTOMY Left 10/30/2018   Procedure: LAPAROSCOPIC UNILATERAL SALPINGO OOPHORECTOMY;  Surgeon: [name], MD;  Location: [facility];  Service: Gynecology;  Laterality: Left;  request 7:30am OR time in [place] block requests one hour   TUBAL LIGATION  2001   WISDOM TOOTH EXTRACTION      SOCIAL HISTORY: Social History   Socioeconomic History   Marital status: Married    Spouse name: Not on file  Number of children: Not on file   Years of education: Not on file   Highest education level: Not on file  Occupational History   Not on file  Tobacco Use   Smoking status: Former    Types: Cigarettes    Quit date: 11/30/1998    Years since quitting: 22.2   Smokeless tobacco: Never  Vaping Use   Vaping Use: Never used  Substance and Sexual Activity   Alcohol use: Not Currently    Alcohol/week: 0.0 standard drinks   Drug use: No   Sexual activity: Not Currently    Partners: Male     Birth control/protection: Surgical    Comment: HYST.-1st intercourse 45 yo-Fewer than 5 partners  Other Topics Concern   Not on file  Social History Narrative   Not on file   Social Determinants of Health   Financial Resource Strain: Not on file  Food Insecurity: Not on file  Transportation Needs: Not on file  Physical Activity: Not on file  Stress: Not on file  Social Connections: Not on file  Intimate Partner Violence: Not on file    FAMILY HISTORY: Family History  Problem Relation Age of Onset   Hypertension Mother    Hypertension Father    Diabetes Paternal Grandmother    Cancer Paternal Grandmother        OV/UT   Breast cancer Paternal Grandmother 10   Breast cancer Cousin 62       MATERNAL   Alcoholism Paternal Grandfather     ALLERGIES:  is allergic to hydrocodone, lactose intolerance (gi), nsaids, and xanax [alprazolam].  MEDICATIONS:  Current Outpatient Medications  Medication Sig Dispense Refill   acetaminophen (TYLENOL) 325 MG tablet Tylenol  prn     cyanocobalamin (,VITAMIN B-12,) 1000 MCG/ML injection      lamoTRIgine (LAMICTAL) 25 MG tablet 50 mg.     lisdexamfetamine (VYVANSE) 50 MG capsule Take 50 mg by mouth daily. In the morning     Melatonin 3 MG TABS Take 3 mg by mouth at bedtime.     ondansetron (ZOFRAN) 4 MG tablet Take 4 mg by mouth daily.     pantoprazole (PROTONIX) 40 MG tablet Take 1 tablet (40 mg total) by mouth at bedtime. (Patient taking differently: Take 40 mg by mouth 2 (two) times daily. One in the morning and once in the evening.) 30 tablet 0   topiramate (TOPAMAX) 50 MG tablet Take 100 mg by mouth at bedtime.     Vitamin D, Ergocalciferol, (DRISDOL) 50000 units CAPS capsule Take 50,000 Units by mouth every 7 (seven) days.     No current facility-administered medications for this visit.    REVIEW OF SYSTEMS:   Constitutional: ( - ) fevers, ( - )  chills , ( - ) night sweats Eyes: ( - ) blurriness of vision, ( - ) double vision,  ( - ) watery eyes Ears, nose, mouth, throat, and face: ( - ) mucositis, ( - ) sore throat Respiratory: ( - ) cough, ( - ) dyspnea, ( - ) wheezes Cardiovascular: ( - ) palpitation, ( - ) chest discomfort, ( - ) lower extremity swelling Gastrointestinal:  ( - ) nausea, ( - ) heartburn, ( - ) change in bowel habits Skin: ( - ) abnormal skin rashes Lymphatics: ( - ) new lymphadenopathy, ( - ) easy bruising Neurological: ( - ) numbness, ( - ) tingling, ( - ) new weaknesses Behavioral/Psych: ( - ) mood change, ( - ) new changes  All other systems were reviewed with the patient and are negative.  PHYSICAL EXAMINATION:  Vitals:   02/20/21 0951  BP: (!) 134/96  Pulse: (!) 104  Resp: 18  Temp: (!) 97.1 F (36.2 C)  SpO2: 100%   Filed Weights   02/20/21 0951  Weight: 140 lb (63.5 kg)    GENERAL: well appearing middle aged Caucasian female. alert, no distress and comfortable SKIN: skin color, texture, turgor are normal, no rashes or significant lesions EYES: conjunctiva are pink and non-injected, sclera clear LUNGS: clear to auscultation and percussion with normal breathing effort HEART: regular rate & rhythm and no murmurs and no lower extremity edema Musculoskeletal: no cyanosis of digits and no clubbing  PSYCH: alert & oriented x 3, fluent speech NEURO: no focal motor/sensory deficits  LABORATORY DATA:  I have reviewed the data as listed CBC Latest Ref Rng & Units 02/20/2021 11/19/2020 09/24/2020  WBC 4.0 - 10.5 K/uL 3.1(L) 7.8 7.1  Hemoglobin 12.0 - 15.0 g/dL 12.1 11.5(L) 11.1(L)  Hematocrit 36.0 - 46.0 % 36.7 34.5(L) 34.4(L)  Platelets 150 - 400 K/uL 239 358 437(H)    CMP Latest Ref Rng & Units 02/20/2021 11/19/2020 09/24/2020  Glucose 70 - 99 mg/dL 96 93 59(L)  BUN 6 - 20 mg/dL _0 Creatinine 0.44 - 1.00 mg/dL 0.84 0.89 0.80  Sodium 135 - 145 mmol/L 140 140 142  Potassium 3.5 - 5.1 mmol/L 3.7 4.2 4.0  Chloride 98 - 111 mmol/L 105 105 107  CO2 22 - 32 mmol/L 24 23 19(L)   Calcium 8.9 - 10.3 mg/dL 9.0 8.9 8.6(L)  Total Protein 6.5 - 8.1 g/dL 6.4(L) 6.6 6.6  Total Bilirubin 0.3 - 1.2 mg/dL 0.6 0.5 0.4  Alkaline Phos 38 - 126 U/L 105 144(H) 124  AST 15 - 41 U/L 35 74(H) 66(H)  ALT 0 - 44 U/L 19 66(H) 35    RADIOGRAPHIC STUDIES: No results found.  ASSESSMENT & PLAN Ana Rose 45 y.o. female with medical history significant for vitamin b12 deficiency/normocytic anemia who presents for a follow up visit.  After review the labs, the records, discussion with the patient the findings are most consistent with improving anemia.  The patient responded modestly with IM vitamin B12 treatment, however she had a more robust response with IV iron therapy.  At this time her findings are most consistent with anemia secondary to her gastric bypass surgery.  Due to relatively mild deficiencies in these further work-up was pursued with a bone marrow biopsy.  Bone marrow biopsy was performed on 06/20/2018 and showed normocellular marrow with trilineage hematopoiesis with maturation.  The peripheral blood also only showed a normocytic anemia.  It is possible that the patient's mild appearing iron labs do truly underlay a genuine iron deficiency.    # Vitamin B12 deficiency  #Normocytic Anemia, improving # Iron Deficiency Anemia #s/p Gastric Bypass in 2010 --findings were most consistent with nutritional deficiencies 2/2 to her gastric bypass, however her hemoglobin has continued to drop despite vitamin B12 therapy --prior full nutritional evaluation with Vitamin b12 ,folate, MMA, homocysteine, copper, iron panel, and ferritin showed findings consistent with Vitamin B12 deficiency and mild iron deficiency --after extensive peripheral blood work no etiology has been found. A bone marrow biopsy also showed no clear etiology --administered IV iron given the relatively low ferritin and the decreased iron stores within the bone marrow.   --Hgb responded with increase to Hgb 12.1  (from 10.3 on 06/25/2020) --RTC in 6 months  or sooner if more iron therapy is required.   No orders of the defined types were placed in this encounter.  All questions were answered. The patient knows to call the clinic with any problems, questions or concerns.  A total of more than 30 minutes were spent on this encounter and over half of that time was spent on counseling and coordination of care as outlined above.   Ledell Peoples, MD Department of Hematology/Oncology [place] [facility] Phone: [phone] Pager: [phone] Email: [email or web address]  02/20/2021 2:29 PM

## 2021-02-23 ENCOUNTER — Encounter: Payer: Self-pay | Admitting: Obstetrics & Gynecology

## 2021-02-24 LAB — METHYLMALONIC ACID, SERUM: Methylmalonic Acid, Quantitative: 116 nmol/L (ref 0–378)

## 2021-02-25 ENCOUNTER — Telehealth: Payer: Self-pay | Admitting: *Deleted

## 2021-02-25 NOTE — Telephone Encounter (Signed)
-----   Message from Otila Kluver, RN sent at 02/25/2021  2:42 PM EDT -----  ----- Message ----- From: Orson Slick, MD Sent: 02/25/2021   9:08 AM EDT To: Otila Kluver, RN  Please let Mrs. Minarik know that her iron levels are strong and stable.  Her vitamin B12 levels are also back to normal range.  I would recommend that she continue vitamin B12 shots once monthly rather than once a week moving forward.  We will plan to see her back in 6 months time to reevaluate.  ----- Message ----- From: Interface, [facility] [place] Sent: 02/20/2021   9:51 AM EDT To: Orson Slick, MD

## 2021-02-25 NOTE — Telephone Encounter (Signed)
LM with note below. To call if she has any questions 

## 2021-02-26 ENCOUNTER — Ambulatory Visit: Payer: 59 | Admitting: Obstetrics & Gynecology

## 2021-03-09 ENCOUNTER — Other Ambulatory Visit: Payer: Self-pay | Admitting: Family Medicine

## 2021-03-09 ENCOUNTER — Ambulatory Visit
Admission: RE | Admit: 2021-03-09 | Discharge: 2021-03-09 | Disposition: A | Discharge status: Home / Self Care | Payer: 59 | Source: Ambulatory Visit | Attending: Family Medicine | Admitting: Family Medicine

## 2021-03-09 ENCOUNTER — Encounter: Payer: Self-pay | Admitting: Hematology and Oncology

## 2021-03-09 DIAGNOSIS — M25471 Effusion, right ankle: Secondary | ICD-10-CM

## 2021-03-09 DIAGNOSIS — M25571 Pain in right ankle and joints of right foot: Secondary | ICD-10-CM

## 2021-03-14 ENCOUNTER — Other Ambulatory Visit: Payer: Self-pay | Admitting: Obstetrics & Gynecology

## 2021-03-27 IMAGING — CT CT BIOPSY
1 of 2 series · 15 of 28 positions shown, 19 images · non-contrast
Comparison: none

INDICATION: Unexplained anemia, B12 deficiency

[Series 2: i-spiral 5.0 br40 · axial · 0.98mm/px · z∈[-128,-51]mm · 15 of 26 slices shown, 19 images]
[im 2/26  mediastinal]
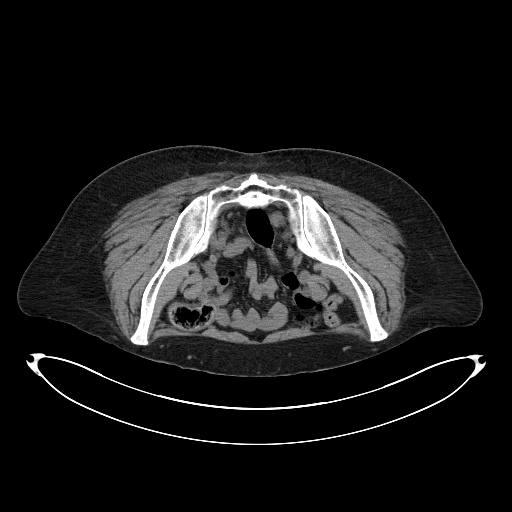
[im 2/26  lung]
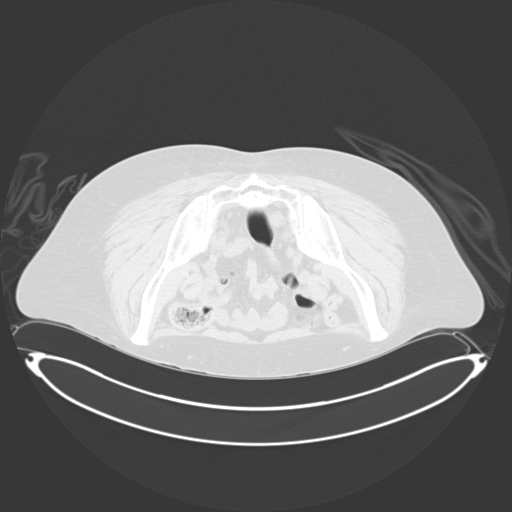
[im 4/26  lung]
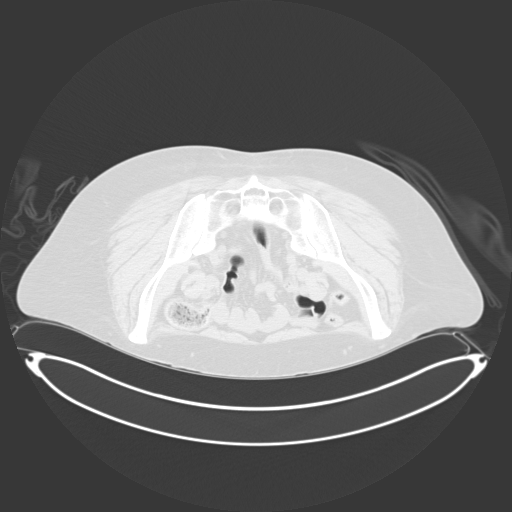
[im 5/26  lung]
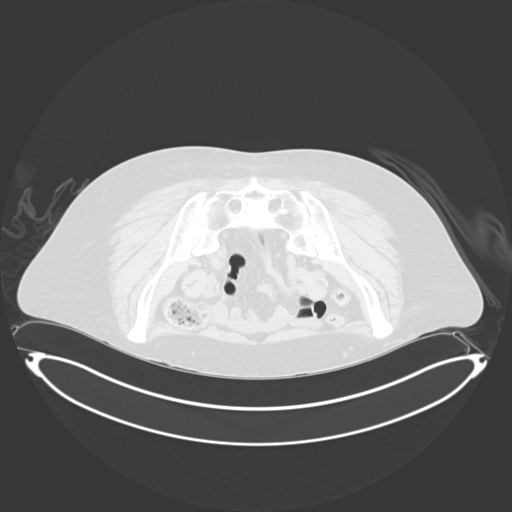
[im 7/26  lung]
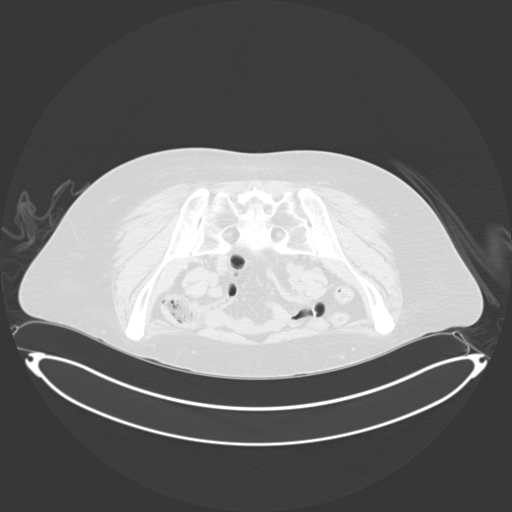
[im 8/26  mediastinal]
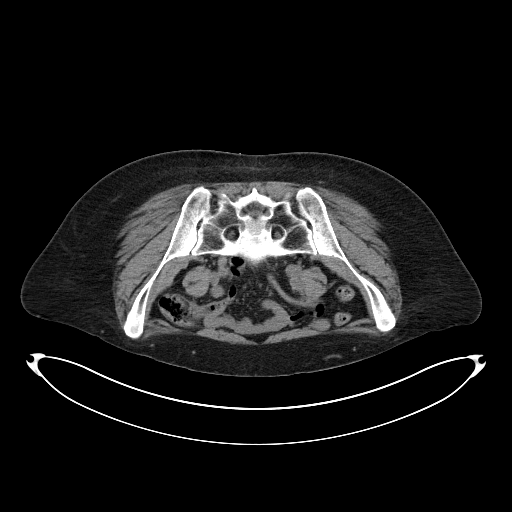
[im 8/26  lung]
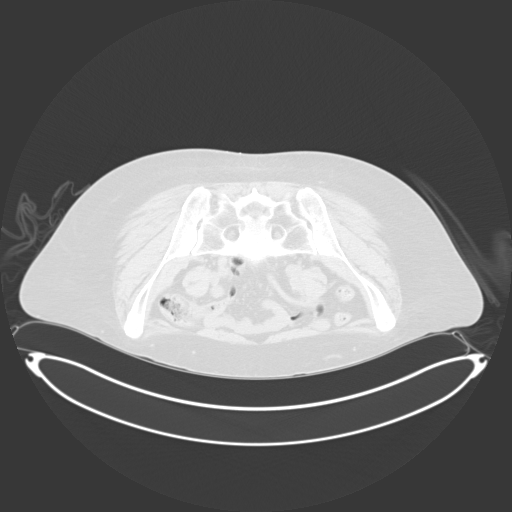
[im 10/26  lung]
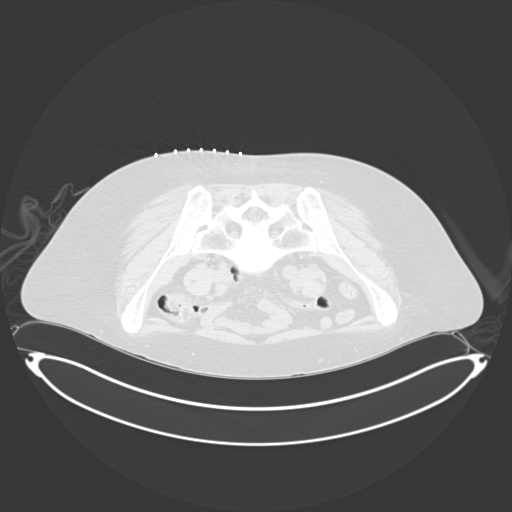
[im 11/26  lung]
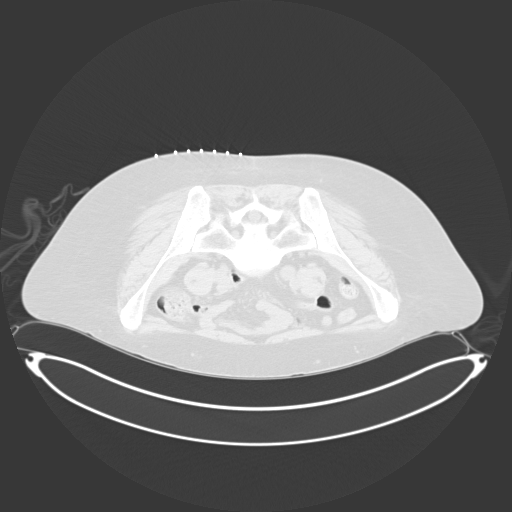
[im 13/26  lung]
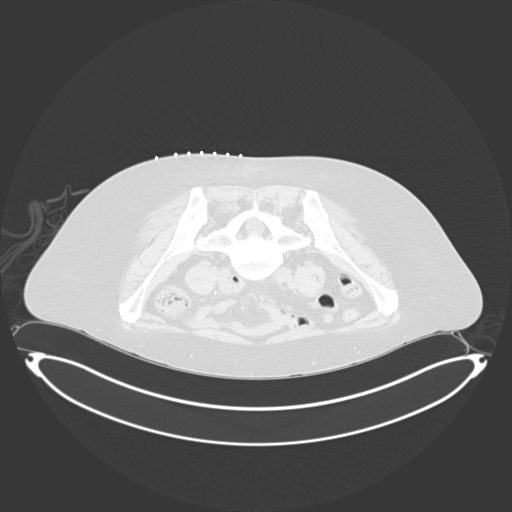
[im 15/26  mediastinal]
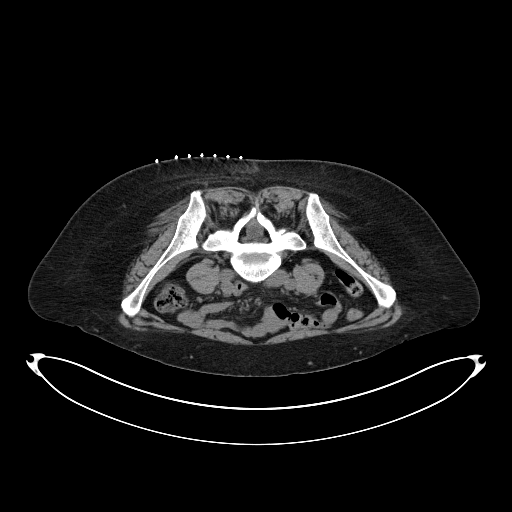
[im 15/26  lung]
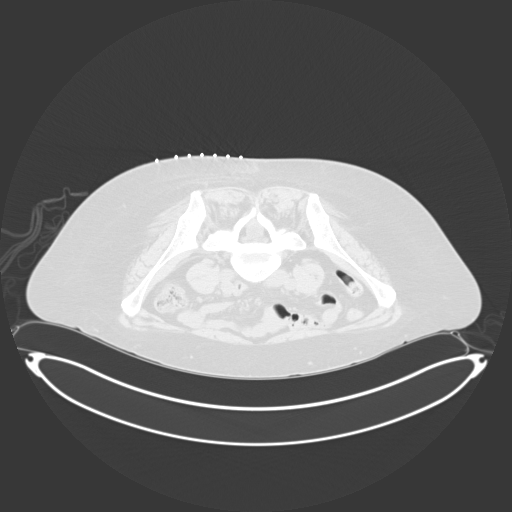
[im 16/26  lung]
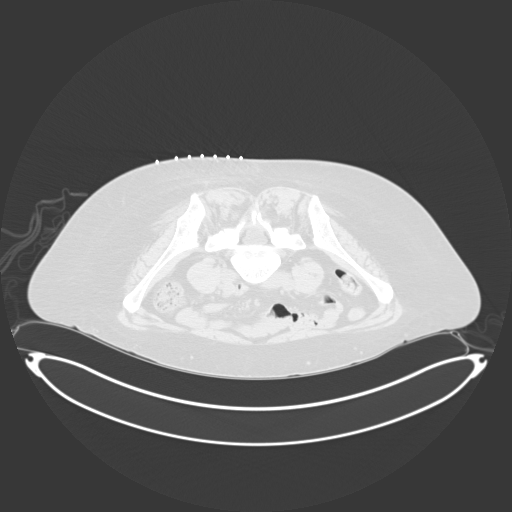
[im 18/26  lung]
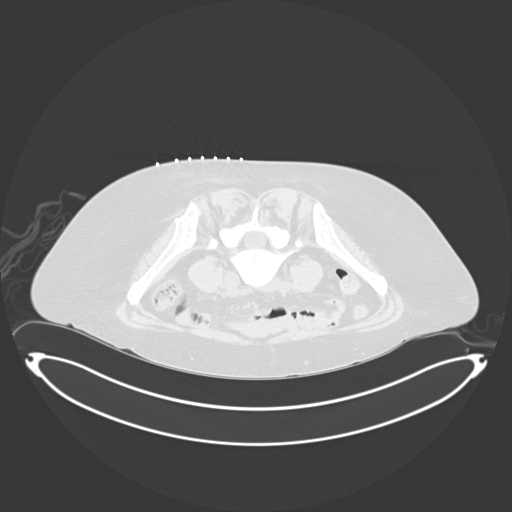
[im 19/26  lung]
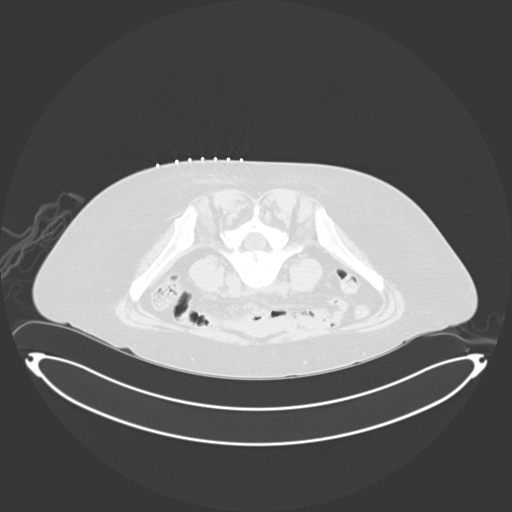
[im 21/26  mediastinal]
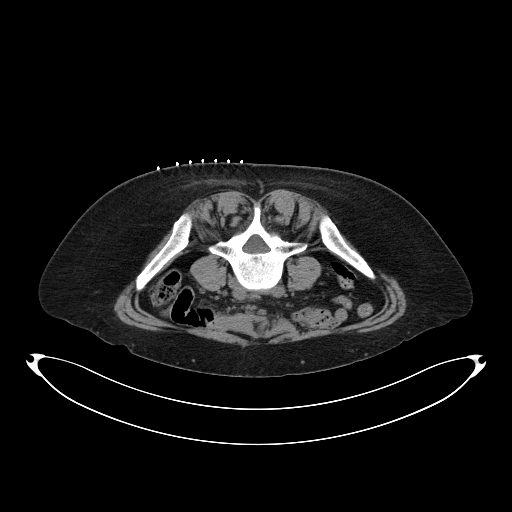
[im 21/26  lung]
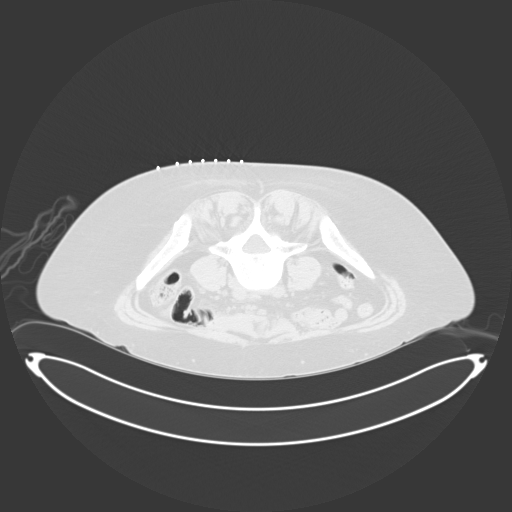
[im 22/26  lung]
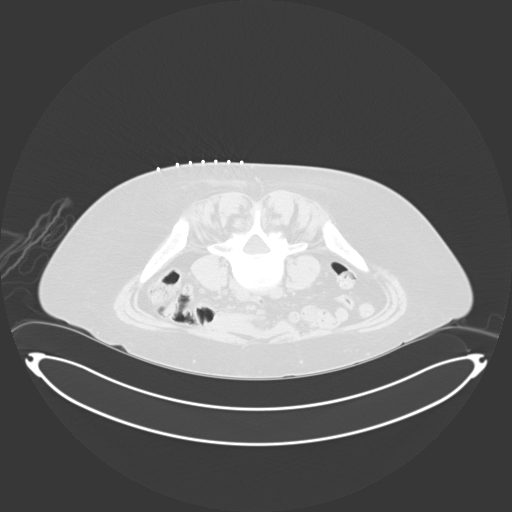
[im 24/26  lung]
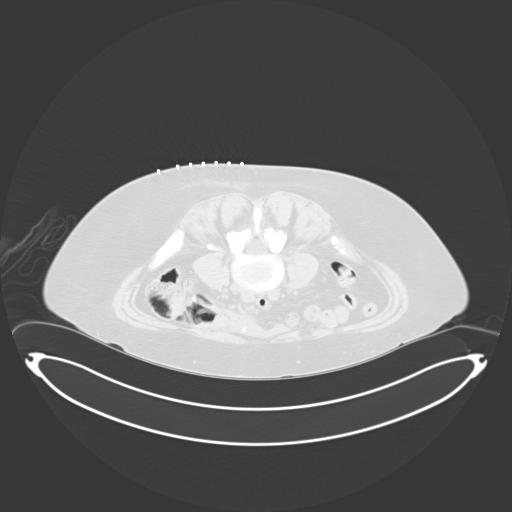

[15 of 28 positions shown; findings below may reference images not displayed]

EXAM:
CT GUIDED RIGHT ILIAC BONE MARROW ASPIRATION AND CORE BIOPSY

Radiologist:  Bernardelli, Gian Maria

Guidance:  CT

FLUOROSCOPY TIME:  Fluoroscopy Time: NONE.

MEDICATIONS:
1% lidocaine local

ANESTHESIA/SEDATION:
2.5 mg IV Versed; 100 mcg IV Fentanyl

Moderate Sedation Time:  10 minutes

The patient was continuously monitored during the procedure by the
interventional radiology nurse under my direct supervision.

CONTRAST:  None.

COMPLICATIONS:
None

PROCEDURE:
Informed consent was obtained from the patient following explanation
of the procedure, risks, benefits and alternatives. The patient
understands, agrees and consents for the procedure. All questions
were addressed. A time out was performed.

The patient was positioned prone and non-contrast localization CT
was performed of the pelvis to demonstrate the iliac marrow spaces.

Maximal barrier sterile technique utilized including caps, mask,
sterile gowns, sterile gloves, large sterile drape, hand hygiene,
and Betadine prep.

Under sterile conditions and local anesthesia, an 11 gauge coaxial
bone biopsy needle was advanced into the right iliac marrow space.
Needle position was confirmed with CT imaging. Initially, bone
marrow aspiration was performed. Next, the 11 gauge outer cannula
was utilized to obtain a right iliac bone marrow core biopsy. Needle
was removed. Hemostasis was obtained with compression. The patient
tolerated the procedure well. Samples were prepared with the
cytotechnologist. No immediate complications.
IMPRESSION: CT guided right iliac bone marrow aspiration and core biopsy.

## 2021-04-27 ENCOUNTER — Ambulatory Visit (INDEPENDENT_AMBULATORY_CARE_PROVIDER_SITE_OTHER): Payer: 59 | Admitting: Plastic Surgery

## 2021-04-27 ENCOUNTER — Other Ambulatory Visit: Payer: Self-pay

## 2021-04-27 VITALS — BP 131/89 | HR 96 | Ht 66.0 in | Wt 148.2 lb

## 2021-04-27 DIAGNOSIS — L905 Scar conditions and fibrosis of skin: Secondary | ICD-10-CM | POA: Diagnosis not present

## 2021-04-27 DIAGNOSIS — D489 Neoplasm of uncertain behavior, unspecified: Secondary | ICD-10-CM | POA: Diagnosis not present
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
## 2021-05-12 ENCOUNTER — Other Ambulatory Visit: Payer: Self-pay | Admitting: Obstetrics & Gynecology

## 2021-05-17 ENCOUNTER — Emergency Department (HOSPITAL_COMMUNITY)
Admission: EM | Admit: 2021-05-17 | Discharge: 2021-05-17 | Disposition: A | Discharge status: Home / Self Care | Payer: 59 | Attending: Emergency Medicine | Admitting: Emergency Medicine

## 2021-05-17 ENCOUNTER — Emergency Department (HOSPITAL_COMMUNITY): Payer: 59

## 2021-05-17 ENCOUNTER — Encounter (HOSPITAL_COMMUNITY): Payer: Self-pay

## 2021-05-17 DIAGNOSIS — Z20822 Contact with and (suspected) exposure to covid-19: Secondary | ICD-10-CM | POA: Diagnosis not present

## 2021-05-17 DIAGNOSIS — R0602 Shortness of breath: Secondary | ICD-10-CM | POA: Diagnosis present

## 2021-05-17 DIAGNOSIS — Z5321 Procedure and treatment not carried out due to patient leaving prior to being seen by health care provider: Secondary | ICD-10-CM | POA: Diagnosis not present

## 2021-05-17 LAB — RESP PANEL BY RT-PCR (FLU A&B, COVID) ARPGX2
Influenza A by PCR: NEGATIVE
Influenza B by PCR: NEGATIVE
SARS Coronavirus 2 by RT PCR: NEGATIVE

## 2021-05-17 MED ORDER — ALBUTEROL SULFATE HFA 108 (90 BASE) MCG/ACT IN AERS: 2.0000 | INHALATION_SPRAY | RESPIRATORY_TRACT | Status: DC | PRN

## 2021-05-17 NOTE — ED Notes (Signed)
Pt called for CXR and EKG, no answer.

## 2021-05-17 NOTE — ED Notes (Signed)
Pt called for room, no answer.

## 2021-05-17 NOTE — ED Triage Notes (Signed)
Pt presents with SHOB, productive cough, fever, chills, headache, fatigue, V/D and bodyaches x10 days. Pt states she had a tele-health and visit 8 days ago and told she had the flu but was not tested. Pt states she is worried because she had postpartum cardiomyopathy and how she feels is the same. Pt took motrin 2000. Pt is A&Ox4

## 2021-05-21 ENCOUNTER — Encounter: Payer: Self-pay | Admitting: Hematology and Oncology

## 2021-05-22 ENCOUNTER — Ambulatory Visit: Payer: 59 | Admitting: Plastic Surgery

## 2021-06-30 ENCOUNTER — Telehealth: Payer: Self-pay

## 2021-06-30 NOTE — Telephone Encounter (Signed)
Patient called stating she has new Friday insurance plan and her patch is now costing her $50. She is inquiring about a less expensive option.  I tried to call her back to discuss if she has a formulary or could find out from her ins co what options might be less expensive for her before her provider prescribed something different.   I was unable to leave a message as her voice mail would not allow.

## 2021-07-02 MED ORDER — ESTRADIOL 1 MG PO TABS: 1.0000 mg | ORAL_TABLET | Freq: Every day | ORAL | 2 refills | Status: DC

## 2021-07-02 NOTE — Telephone Encounter (Signed)
Ana Rose see the below, patient and wife called back asking if another provider would be willing to switch to oral estrogen?

## 2021-07-02 NOTE — Telephone Encounter (Addendum)
Please let her know that the risk is felt to be smaller with transdermal than oral estrogen. It avoids first pass metabolism through her liver and likely decreases her risk of blood clots. Given that, if she wants to switch to oral estrogen she can. The equivalent dose would be 1 mg of oral estradiol.  If she wants this, you can call in 90 with 2 refills. She should f/u with Dr Dellis Filbert in 9/23 for her annual exam.  CC: Marny Lowenstein, NP

## 2021-07-02 NOTE — Telephone Encounter (Signed)
FYI. Pt returned phone call re: last msg. Pt notified to contact insurance to discuss coverage and out of pocket costs for a different/alternative medication. Was encouraged to call us and let us know and if its a different medication that she has concerns/questions about, we would be happy to see her and discuss. Pt voiced understanding.

## 2021-07-02 NOTE — Addendum Note (Signed)
Addended by: Thamas Jaegers on: 07/02/2021 02:29 PM   Modules accepted: Orders

## 2021-07-02 NOTE — Telephone Encounter (Addendum)
Dr.Lavoie patient called back she called insurance company and was informed that oral estradiol will be a tier 1 level for her. She asked if oral estradiol was an option? She was previously using estradiol (vivelle dot 0.5 mg). Please advise

## 2021-08-03 ENCOUNTER — Ambulatory Visit: Payer: 59 | Admitting: Plastic Surgery

## 2021-08-19 ENCOUNTER — Encounter: Payer: Self-pay | Admitting: Radiology

## 2021-08-19 ENCOUNTER — Ambulatory Visit: Payer: 59 | Admitting: Radiology

## 2021-08-19 ENCOUNTER — Encounter: Payer: Self-pay | Admitting: Hematology and Oncology

## 2021-08-19 ENCOUNTER — Other Ambulatory Visit: Payer: Self-pay

## 2021-08-19 VITALS — BP 102/64

## 2021-08-19 DIAGNOSIS — N8111 Cystocele, midline: Secondary | ICD-10-CM

## 2021-08-19 DIAGNOSIS — R300 Dysuria: Secondary | ICD-10-CM

## 2021-08-19 DIAGNOSIS — R1032 Left lower quadrant pain: Secondary | ICD-10-CM
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
## 2021-08-21 ENCOUNTER — Inpatient Hospital Stay: Payer: 59 | Attending: Obstetrics & Gynecology

## 2021-08-21 ENCOUNTER — Encounter: Payer: Self-pay | Admitting: Hematology and Oncology

## 2021-08-21 ENCOUNTER — Other Ambulatory Visit: Payer: Self-pay | Admitting: Hematology and Oncology

## 2021-08-21 ENCOUNTER — Other Ambulatory Visit: Payer: Self-pay

## 2021-08-21 ENCOUNTER — Inpatient Hospital Stay (HOSPITAL_BASED_OUTPATIENT_CLINIC_OR_DEPARTMENT_OTHER): Payer: 59 | Admitting: Hematology and Oncology

## 2021-08-21 VITALS — BP 110/76 | HR 100 | Temp 97.7°F | Wt 151.1 lb

## 2021-08-21 DIAGNOSIS — D508 Other iron deficiency anemias: Secondary | ICD-10-CM

## 2021-08-21 DIAGNOSIS — E119 Type 2 diabetes mellitus without complications: Secondary | ICD-10-CM

## 2021-08-21 DIAGNOSIS — E538 Deficiency of other specified B group vitamins: Secondary | ICD-10-CM

## 2021-08-21 DIAGNOSIS — D649 Anemia, unspecified: Secondary | ICD-10-CM

## 2021-08-21 DIAGNOSIS — D509 Iron deficiency anemia, unspecified: Secondary | ICD-10-CM | POA: Insufficient documentation

## 2021-08-21 DIAGNOSIS — Z87891 Personal history of nicotine dependence: Secondary | ICD-10-CM

## 2021-08-21 DIAGNOSIS — Z9884 Bariatric surgery status: Secondary | ICD-10-CM

## 2021-08-21 DIAGNOSIS — Z808 Family history of malignant neoplasm of other organs or systems: Secondary | ICD-10-CM

## 2021-08-21 DIAGNOSIS — Z803 Family history of malignant neoplasm of breast: Secondary | ICD-10-CM

## 2021-08-21 LAB — RETIC PANEL
Immature Retic Fract: 21.9 % — ABNORMAL HIGH (ref 2.3–15.9)
RBC.: 3.86 MIL/uL — ABNORMAL LOW (ref 3.87–5.11)
Retic Count, Absolute: 102.7 10*3/uL (ref 19.0–186.0)
Retic Ct Pct: 2.7 % (ref 0.4–3.1)
Reticulocyte Hemoglobin: 37.4 pg (ref >27.9)

## 2021-08-21 LAB — CBC WITH DIFFERENTIAL (CANCER CENTER ONLY)
Abs Immature Granulocytes: 0 10*3/uL (ref 0.00–0.07)
Basophils Absolute: 0 10*3/uL (ref 0.0–0.1)
Basophils Relative: 1 %
Eosinophils Absolute: 0.1 10*3/uL (ref 0.0–0.5)
Eosinophils Relative: 2 %
HCT: 34.9 % — ABNORMAL LOW (ref 36.0–46.0)
Hemoglobin: 12 g/dL (ref 12.0–15.0)
Immature Granulocytes: 0 %
Lymphocytes Relative: 56 %
Lymphs Abs: 2 10*3/uL (ref 0.7–4.0)
MCH: 31.9 pg (ref 26.0–34.0)
MCHC: 34.4 g/dL (ref 30.0–36.0)
MCV: 92.8 fL (ref 80.0–100.0)
Monocytes Absolute: 0.2 10*3/uL (ref 0.1–1.0)
Monocytes Relative: 6 %
Neutro Abs: 1.2 10*3/uL — ABNORMAL LOW (ref 1.7–7.7)
Neutrophils Relative %: 35 %
Platelet Count: 238 10*3/uL (ref 150–400)
RBC: 3.76 MIL/uL — ABNORMAL LOW (ref 3.87–5.11)
RDW: 14.5 % (ref 11.5–15.5)
WBC Count: 3.6 10*3/uL — ABNORMAL LOW (ref 4.0–10.5)
nRBC: 0 % (ref 0.0–0.2)

## 2021-08-21 LAB — URINALYSIS, COMPLETE W/RFL CULTURE
Bacteria, UA: NONE SEEN /HPF
Bilirubin Urine: NEGATIVE
Glucose, UA: NEGATIVE
Hgb urine dipstick: NEGATIVE
Hyaline Cast: NONE SEEN /LPF
Ketones, ur: NEGATIVE
Leukocyte Esterase: NEGATIVE
Nitrites, Initial: NEGATIVE
Protein, ur: NEGATIVE
RBC / HPF: NONE SEEN /HPF (ref 0–2)
Specific Gravity, Urine: 1.002 (ref 1.001–1.035)
WBC, UA: NONE SEEN /HPF (ref 0–5)
pH: 5.5 (ref 5.0–8.0)

## 2021-08-21 LAB — CMP (CANCER CENTER ONLY)
ALT: 35 U/L (ref 0–44)
AST: 75 U/L — ABNORMAL HIGH (ref 15–41)
Albumin: 4.3 g/dL (ref 3.5–5.0)
Alkaline Phosphatase: 102 U/L (ref 38–126)
Anion gap: 9 (ref 5–15)
BUN: 7 mg/dL (ref 6–20)
CO2: 26 mmol/L (ref 22–32)
Calcium: 8.8 mg/dL — ABNORMAL LOW (ref 8.9–10.3)
Chloride: 102 mmol/L (ref 98–111)
Creatinine: 0.79 mg/dL (ref 0.44–1.00)
GFR, Estimated: >60 mL/min (ref >60)
Glucose, Bld: 114 mg/dL — ABNORMAL HIGH (ref 70–99)
Potassium: 3.9 mmol/L (ref 3.5–5.1)
Sodium: 137 mmol/L (ref 135–145)
Total Bilirubin: 0.5 mg/dL (ref 0.3–1.2)
Total Protein: 7.2 g/dL (ref 6.5–8.1)

## 2021-08-21 LAB — IRON AND IRON BINDING CAPACITY (CC-WL,HP ONLY)
Iron: 216 ug/dL — ABNORMAL HIGH (ref 28–170)
Saturation Ratios: 70 % — ABNORMAL HIGH (ref 10.4–31.8)
TIBC: 311 ug/dL (ref 250–450)
UIBC: 95 ug/dL — ABNORMAL LOW (ref 148–442)

## 2021-08-21 LAB — URINE CULTURE
MICRO NUMBER:: 13195554
SPECIMEN QUALITY:: ADEQUATE

## 2021-08-21 LAB — FERRITIN: Ferritin: 102 ng/mL (ref 11–307)

## 2021-08-21 LAB — VITAMIN B12: Vitamin B-12: 382 pg/mL (ref 180–914)

## 2021-08-21 LAB — CULTURE INDICATED

## 2021-08-21 NOTE — Progress Notes (Signed)
?[place] ?[phone]   [phone] ? ?PROGRESS NOTE ? ?Patient Care Team: ?Rankins, Bill Salinas, MD as PCP - General (Family Medicine) ? ?Hematological/Oncological History ?# Normocytic Anemia ?1) 11/17/2018: Iron 39, Ferritin 8, Sat 10%, TIBC 408  ?2) 11/14/2019: Vitamin b12 109, Folate 13.2,  ?3) 12/03/2019-12/17/2019: received weekly injections of 1000 mcg of Vitamin B12. An additional dose was given on 01/21/2020 ?4) 01/21/2020: WBC 4.3, Hgb 9.2, MCV 82.6, Plt 350. Vitamin B12 130 (nml 180-914)   ?5) 02/20/2020: establish care with Dr. Lorenso Courier. WBC 6.9, Hgb 10.0, MCV 83.3, Plt 340 ?6) 04/25/2020: WBC 4.1, Hgb 9.2, MCV 92.2, Plt 253  ?7) 5/11-5/18/2022: 2 doses of IV Feraheme ?8) 02/20/2021: WBC 3.1, Hgb 12.1, MCV 98.9, Plt 239 ?9) 08/21/2021: WBC 3.6, Hgb 12.0, Mcv 92.8, Plt 238 ? ?Interval History:  ?Ana Rose 46 y.o. female with medical history significant for vitamin b12 deficiency who presents for a follow up visit. The patient's last visit was on 9/302022.  ? ?On exam today Ana Rose is accompanied by her wife.  She reports that she has been well overall in the interim since her last visit.  She has had no major changes in her health since that time.  She does that she is not having shortness of breath, lightheadedness, dizziness, or other concerning symptoms.  She continues to eat a regular diet.  Her energy levels have been stable.  Overall she is remained at a steady level of health.  She currently denies any fevers, chills, sweats, nausea, or chest pain.  A full 10 point ROS is listed below. ? ?MEDICAL HISTORY:  ?Past Medical History:  ?Diagnosis Date  ? ADD (attention deficit disorder)   ? Anemia   ? Anxiety   ? Bipolar disorder ([place])   ? CIN I (cervical intraepithelial neoplasia I)   ? Depression   ? Diabetes ([place])   ? Epileptic seizures ([place])   ? Last seizure at 46 years of age  ? GERD (gastroesophageal reflux disease)   ? Heart murmur   ? funtional  ? Hiatal hernia    ? History of dysfunctional uterine bleeding   ? Hypercholesteremia 08/31/2016  ? Noted in note  ? Hypothyroidism   ? Irritable bowel syndrome (IBS)   ? PONV (postoperative nausea and vomiting)   ? Suicide attempt ([place]) 08/24/2012  ? ? ?SURGICAL HISTORY: ?Past Surgical History:  ?Procedure Laterality Date  ? CHOLECYSTECTOMY  2001  ? COLONOSCOPY  05/27/2017  ? CYST EXCISION Right 10/24/2018  ? Procedure: Excision of right inner thigh sebaceous cyst;  Surgeon: Wallace Going, DO;  Location: [place];  Service: Plastics;  Laterality: Right;  ? ESOPHAGOGASTRODUODENOSCOPY  05/2012  ? GASTRIC BYPASS  2010  ? GYNECOLOGIC CRYOSURGERY  Approximately age 17  ? LAPAROSCOPIC TOTAL HYSTERECTOMY  7.16.2012  ? endometriosis on fallopian tubes  ? LAPAROSCOPIC UNILATERAL SALPINGECTOMY Right 10/30/2018  ? Procedure: LAPAROSCOPIC UNILATERAL SALPINGECTOMY;  Surgeon: [name], MD;  Location: [place];  Service: Gynecology;  Laterality: Right;  ? LAPAROSCOPIC UNILATERAL SALPINGO OOPHERECTOMY Left 10/30/2018  ? Procedure: LAPAROSCOPIC UNILATERAL SALPINGO OOPHORECTOMY;  Surgeon: [name], MD;  Location: [place];  Service: Gynecology;  Laterality: Left;  request 7:30am OR time in [facility] block ?requests one hour  ? TUBAL LIGATION  2001  ? WISDOM TOOTH EXTRACTION    ? ? ?SOCIAL HISTORY: ?Social History  ? ?Socioeconomic History  ? Marital status: Married  ?  Spouse  name: Not on file  ? Number of children: Not on file  ? Years of education: Not on file  ? Highest education level: Not on file  ?Occupational History  ? Not on file  ?Tobacco Use  ? Smoking status: Former  ?  Types: Cigarettes  ?  Quit date: 11/30/1998  ?  Years since quitting: 22.7  ? Smokeless tobacco: Never  ?Vaping Use  ? Vaping Use: Never used  ?Substance and Sexual Activity  ? Alcohol use: Not Currently  ?  Alcohol/week: 0.0 standard drinks  ? Drug use: No  ? Sexual activity: Yes  ?  Partners:  Female  ?  Birth control/protection: Surgical  ?  Comment: HYST.-1st intercourse 46 yo-Fewer than 5 partners  ?Other Topics Concern  ? Not on file  ?Social History Narrative  ? Not on file  ? ?Social Determinants of Health  ? ?Financial Resource Strain: Not on file  ?Food Insecurity: Not on file  ?Transportation Needs: Not on file  ?Physical Activity: Not on file  ?Stress: Not on file  ?Social Connections: Not on file  ?Intimate Partner Violence: Not on file  ? ? ?FAMILY HISTORY: ?Family History  ?Problem Relation Age of Onset  ? Hypertension Mother   ? Hypertension Father   ? Diabetes Paternal Grandmother   ? Cancer Paternal Grandmother   ?     OV/UT  ? Breast cancer Paternal Grandmother 68  ? Breast cancer Cousin 34  ?     MATERNAL  ? Alcoholism Paternal Grandfather   ? ? ?ALLERGIES:  is allergic to hydrocodone, lactose intolerance (gi), nsaids, and xanax [alprazolam]. ? ?MEDICATIONS:  ?Current Outpatient Medications  ?Medication Sig Dispense Refill  ? acetaminophen (TYLENOL) 325 MG tablet Tylenol ? prn    ? cyanocobalamin (,VITAMIN B-12,) 1000 MCG/ML injection     ? estradiol (ESTRACE) 1 MG tablet Take 1 tablet (1 mg total) by mouth daily. 90 tablet 2  ? folic acid (FOLVITE) 1 MG tablet Take 1 mg by mouth daily.    ? guanFACINE (TENEX) 1 MG tablet Take 1 mg by mouth 2 (two) times daily.    ? hydrOXYzine (VISTARIL) 50 MG capsule Take by mouth.    ? lamoTRIgine (LAMICTAL) 100 MG tablet Take 100 mg by mouth daily.    ? lamoTRIgine (LAMICTAL) 25 MG tablet 50 mg.    ? lisdexamfetamine (VYVANSE) 50 MG capsule Take 50 mg by mouth daily. In the morning (Patient not taking: Reported on 04/27/2021)    ? melatonin 3 MG TABS tablet 1 tablet at bedtime as needed with food    ? Melatonin 3 MG TABS Take 3 mg by mouth at bedtime.    ? Multiple Vitamins-Minerals (ONCOVITE) TABS Take 1 tablet by mouth daily.    ? ondansetron (ZOFRAN) 4 MG tablet Take 4 mg by mouth daily.    ? pantoprazole (PROTONIX) 40 MG tablet Take 1 tablet (40  mg total) by mouth at bedtime. (Patient taking differently: Take 40 mg by mouth 2 (two) times daily. One in the morning and once in the evening.) 30 tablet 0  ? topiramate (TOPAMAX) 50 MG tablet Take 100 mg by mouth at bedtime.    ? Vitamin D, Ergocalciferol, (DRISDOL) 50000 units CAPS capsule Take 50,000 Units by mouth every 7 (seven) days. (Patient not taking: Reported on 08/19/2021)    ? ?No current facility-administered medications for this visit.  ? ? ?REVIEW OF SYSTEMS:   ?Constitutional: ( - ) fevers, ( - )  chills , ( - ) night sweats ?Eyes: ( - ) blurriness of vision, ( - ) double vision, ( - ) watery eyes ?Ears, nose, mouth, throat, and face: ( - ) mucositis, ( - ) sore throat ?Respiratory: ( - ) cough, ( - ) dyspnea, ( - ) wheezes ?Cardiovascular: ( - ) palpitation, ( - ) chest discomfort, ( - ) lower extremity swelling ?Gastrointestinal:  ( - ) nausea, ( - ) heartburn, ( - ) change in bowel habits ?Skin: ( - ) abnormal skin rashes ?Lymphatics: ( - ) new lymphadenopathy, ( - ) easy bruising ?Neurological: ( - ) numbness, ( - ) tingling, ( - ) new weaknesses ?Behavioral/Psych: ( - ) mood change, ( - ) new changes  ?All other systems were reviewed with the patient and are negative. ? ?PHYSICAL EXAMINATION: ? ?Vitals:  ? 08/21/21 1142  ?BP: 110/76  ?Pulse: 100  ?Temp: 97.7 ?F (36.5 ?C)  ?SpO2: 99%  ? ?Filed Weights  ? 08/21/21 1142  ?Weight: 151 lb 1.6 oz (68.5 kg)  ? ? ?GENERAL: well appearing middle aged Caucasian female. alert, no distress and comfortable ?SKIN: skin color, texture, turgor are normal, no rashes or significant lesions ?EYES: conjunctiva are pink and non-injected, sclera clear ?LUNGS: clear to auscultation and percussion with normal breathing effort ?HEART: regular rate & rhythm and no murmurs and no lower extremity edema ?Musculoskeletal: no cyanosis of digits and no clubbing  ?PSYCH: alert & oriented x 3, fluent speech ?NEURO: no focal motor/sensory deficits ? ?LABORATORY DATA:  ?I have  reviewed the data as listed ? ?  Latest Ref Rng & Units 08/21/2021  ? 10:58 AM 02/20/2021  ?  9:37 AM 11/19/2020  ?  9:36 AM  ?CBC  ?WBC 4.0 - 10.5 K/uL 3.6   3.1   7.8    ?Hemoglobin 12.0 - 15.0 g/dL 12.0

## 2021-08-30 ENCOUNTER — Encounter: Payer: Self-pay | Admitting: Hematology and Oncology

## 2021-09-10 ENCOUNTER — Encounter: Payer: Self-pay | Admitting: Hematology and Oncology

## 2021-09-15 ENCOUNTER — Encounter: Payer: Self-pay | Admitting: Hematology and Oncology

## 2021-09-17 ENCOUNTER — Telehealth: Payer: Self-pay | Admitting: *Deleted

## 2021-09-17 ENCOUNTER — Ambulatory Visit (INDEPENDENT_AMBULATORY_CARE_PROVIDER_SITE_OTHER): Payer: 59

## 2021-09-17 ENCOUNTER — Ambulatory Visit: Payer: 59 | Admitting: Obstetrics & Gynecology

## 2021-09-17 ENCOUNTER — Encounter: Payer: Self-pay | Admitting: Obstetrics & Gynecology

## 2021-09-17 VITALS — BP 106/76

## 2021-09-17 DIAGNOSIS — R1032 Left lower quadrant pain: Secondary | ICD-10-CM | POA: Diagnosis not present

## 2021-09-17 DIAGNOSIS — Z90710 Acquired absence of both cervix and uterus: Secondary | ICD-10-CM

## 2021-09-17 NOTE — Telephone Encounter (Signed)
Patient called back from 09/17/21 requesting referral to GI for LLQ pain,  patient said the GI she was going to see doesn't accept her insurance. Referral placed at GI number given to patient to schedule.   ? ?FYI  ?

## 2021-09-17 NOTE — Progress Notes (Signed)
? ? ?  Ana Rose 23-Dec-1975 [id number] ? ? ?     46 y.o.  G1P1001  ? ?RP: Intermittent LLQ pain x about 1 month for Pelvic US ? ?HPI: S/P TAH/BS/Left Oophorectomy. Intermittent LLQ pain x about 1 month.  The pain is intermittent and variable in intensity.  Patient reports going from diarrhea to constipation since her Gastric Bypass surgery. ? ? ?OB History  ?Gravida Para Term Preterm AB Living  ?'1 1 1     1  '$ ?SAB IAB Ectopic Multiple Live Births  ?           ?  ?# Outcome Date GA Lbr Len/2nd Weight Sex Delivery Anes PTL Lv  ?1 Term           ? ? ?Past medical history,surgical history, problem list, medications, allergies, family history and social history were all reviewed and documented in the EPIC chart. ? ? ?Directed ROS with pertinent positives and negatives documented in the history of present illness/assessment and plan. ? ?Exam: ? ?Vitals:  ? 09/17/21 0958  ?BP: 106/76  ? ?General appearance:  Normal ? ?Pelvic US today: T/V images.  Status post total abdominal hysterectomy with bilateral salpingectomy and left oophorectomy.  Vaginal cuff normal.  Right ovary is with atrophic appearance.  Positive perfusion to the right ovary.  Moderate stool burden seen in the colon, patient endorses some constipation.  Moderate bowel gas seen throughout the pelvis.  No adnexal mass.  No free fluid in the pelvis. ? ? ?Assessment/Plan:  46 y.o. G1P1001  ? ?1. LLQ pain ?S/P TAH/BS/Left Oophorectomy. Intermittent LLQ pain x about 1 month.  The pain is intermittent and variable in intensity.  Patient reports going from diarrhea to constipation since her Gastric Bypass surgery.  Pelvic ultrasound findings thoroughly reviewed with patient.  Patient reassured that no pelvic cyst or mass were seen.  Right ovary is atrophic and normal.  No free fluid in the pelvis.  Patient informed that bowel gas is present throughout the bowels and the pelvis and that she has a moderate stool burden in the colon.  Therefore recommend a consultation  with her gastroenterologist. ? ?2. S/P TAH (total abdominal hysterectomy), bilateral Salpingectomy with Left Oophorectomy ? ?Other orders ?- acamprosate (CAMPRAL) 333 MG tablet; Take 666 mg by mouth 3 (three) times daily. ?- traZODone (DESYREL) 50 MG tablet; Take 50 mg by mouth at bedtime. ?- lamoTRIgine (LAMICTAL) 100 MG tablet; Take 100 mg by mouth daily. ?- Thiamine HCl (B-1 PO); Take by mouth.  ? ?Princess Bruins MD, 10:14 AM 09/17/2021 ? ? ? ?  ?

## 2021-10-05 ENCOUNTER — Ambulatory Visit (INDEPENDENT_AMBULATORY_CARE_PROVIDER_SITE_OTHER): Payer: 59

## 2021-10-05 ENCOUNTER — Ambulatory Visit (INDEPENDENT_AMBULATORY_CARE_PROVIDER_SITE_OTHER): Payer: 59 | Admitting: Orthopaedic Surgery

## 2021-10-05 DIAGNOSIS — M25562 Pain in left knee: Secondary | ICD-10-CM

## 2021-10-05 DIAGNOSIS — M222X2 Patellofemoral disorders, left knee: Secondary | ICD-10-CM | POA: Diagnosis not present

## 2021-10-05 DIAGNOSIS — G8929 Other chronic pain: Secondary | ICD-10-CM | POA: Diagnosis not present

## 2021-10-05 MED ORDER — TRIAMCINOLONE ACETONIDE 40 MG/ML IJ SUSP
80.0000 mg | INTRAMUSCULAR | Status: AC | PRN
  Administered 2021-10-05: 80 mg via INTRA_ARTICULAR

## 2021-10-05 MED ORDER — LIDOCAINE HCL 1 % IJ SOLN
4.0000 mL | INTRAMUSCULAR | Status: AC | PRN
  Administered 2021-10-05: 4 mL

## 2021-10-05 NOTE — Progress Notes (Addendum)
? ?                            ? ? ?Chief Complaint: Left knee pain ?  ? ? ?History of Present Illness:  ? ? ?Ana Rose is a 46 y.o. female presents with 1 year of left knee pain which has been ongoing now and worsening over the last several months.  She does have a history of right knee patellofemoral type pain which was previously treated at [facility].  She is here today for the left knee.  She states that she is tender about the medial aspect of the left knee.  She has not had any injections or physical therapy for this side.  She enjoys doing gardening in her spare time.  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  She is here today with her wife.  She does have pain going up and down stairs and with bending of the knee.  She has pain in the knee with longer drives ? ? ? ?Surgical History:   ?None ? ?PMH/PSH/Family History/Social History/Meds/Allergies:   ? ?Past Medical History:  ?Diagnosis Date  ?? ADD (attention deficit disorder)   ?? Anemia   ?? Anxiety   ?? Bipolar disorder ([place])   ?? CIN I (cervical intraepithelial neoplasia I)   ?? Depression   ?? Diabetes ([place])   ?? Epileptic seizures ([place])   ? Last seizure at 46 years of age  ?? GERD (gastroesophageal reflux disease)   ?? Heart murmur   ? funtional  ?? Hiatal hernia   ?? History of dysfunctional uterine bleeding   ?? Hypercholesteremia 08/31/2016  ? Noted in note  ?? Hypothyroidism   ?? Irritable bowel syndrome (IBS)   ?? PONV (postoperative nausea and vomiting)   ?? Suicide attempt ([place]) 08/24/2012  ? ?Past Surgical History:  ?Procedure Laterality Date  ?? CHOLECYSTECTOMY  2001  ?? COLONOSCOPY  05/27/2017  ?? CYST EXCISION Right 10/24/2018  ? Procedure: Excision of right inner thigh sebaceous cyst;  Surgeon: Wallace Going, DO;  Location: [place];  Service: Plastics;  Laterality: Right;  ?? ESOPHAGOGASTRODUODENOSCOPY  05/2012  ?? GASTRIC BYPASS  2010  ?? GYNECOLOGIC CRYOSURGERY   Approximately age 75  ?? LAPAROSCOPIC TOTAL HYSTERECTOMY  7.16.2012  ? endometriosis on fallopian tubes  ?? LAPAROSCOPIC UNILATERAL SALPINGECTOMY Right 10/30/2018  ? Procedure: LAPAROSCOPIC UNILATERAL SALPINGECTOMY;  Surgeon: [name], MD;  Location: [place];  Service: Gynecology;  Laterality: Right;  ?? LAPAROSCOPIC UNILATERAL SALPINGO OOPHERECTOMY Left 10/30/2018  ? Procedure: LAPAROSCOPIC UNILATERAL SALPINGO OOPHORECTOMY;  Surgeon: [name], MD;  Location: [place];  Service: Gynecology;  Laterality: Left;  request 7:30am OR time in [facility] block ?requests one hour  ?? TUBAL LIGATION  2001  ?? WISDOM TOOTH EXTRACTION    ? ?Social History  ? ?Socioeconomic History  ?? Marital status: Married  ?  Spouse name: Not on file  ?? Number of children: Not on file  ?? Years of education: Not on file  ?? Highest education level: Not on file  ?Occupational History  ?? Not on file  ?Tobacco Use  ?? Smoking status: Former  ?  Types: Cigarettes  ?  Quit date: 11/30/1998  ?  Years since quitting: 22.8  ?? Smokeless tobacco: Never  ?Vaping Use  ?? Vaping Use: Never used  ?Substance and Sexual Activity  ?? Alcohol use:  Not Currently  ?  Alcohol/week: 0.0 standard drinks  ?? Drug use: No  ?? Sexual activity: Yes  ?  Partners: Female  ?  Birth control/protection: Surgical  ?  Comment: HYST.-1st intercourse 46 yo-Fewer than 5 partners  ?Other Topics Concern  ?? Not on file  ?Social History Narrative  ?? Not on file  ? ?Social Determinants of Health  ? ?Financial Resource Strain: Not on file  ?Food Insecurity: Not on file  ?Transportation Needs: Not on file  ?Physical Activity: Not on file  ?Stress: Not on file  ?Social Connections: Not on file  ? ?Family History  ?Problem Relation Age of Onset  ?? Hypertension Mother   ?? Hypertension Father   ?? Diabetes Paternal Grandmother   ?? Cancer Paternal Grandmother   ?     OV/UT  ?? Breast cancer Paternal Grandmother 74  ?? Breast  cancer Cousin 34  ?     MATERNAL  ?? Alcoholism Paternal Grandfather   ? ?Allergies  ?Allergen Reactions  ?? Hydrocodone Nausea And Vomiting  ?  vicodin  ?? Lactose Intolerance (Gi) Nausea And Vomiting  ?? Nsaids Other (See Comments)  ?  Oral NSAIDS contraindicated due to Gastric Bypass  ?? Xanax [Alprazolam] Other (See Comments)  ?  Over sedation  ? ?Current Outpatient Medications  ?Medication Sig Dispense Refill  ?? acamprosate (CAMPRAL) 333 MG tablet Take 666 mg by mouth 3 (three) times daily.    ?? acetaminophen (TYLENOL) 325 MG tablet Tylenol ? prn    ?? cyanocobalamin (,VITAMIN B-12,) 1000 MCG/ML injection     ?? estradiol (ESTRACE) 1 MG tablet Take 1 tablet (1 mg total) by mouth daily. 90 tablet 2  ?? folic acid (FOLVITE) 1 MG tablet Take 1 mg by mouth daily.    ?? guanFACINE (TENEX) 1 MG tablet Take 1 mg by mouth 2 (two) times daily.    ?? hydrOXYzine (VISTARIL) 50 MG capsule Take by mouth.    ?? lamoTRIgine (LAMICTAL) 100 MG tablet Take 100 mg by mouth daily.    ?? melatonin 3 MG TABS tablet 1 tablet at bedtime as needed with food    ?? Multiple Vitamins-Minerals (ONCOVITE) TABS Take 1 tablet by mouth daily.    ?? ondansetron (ZOFRAN) 4 MG tablet Take 4 mg by mouth daily.    ?? pantoprazole (PROTONIX) 40 MG tablet Take 1 tablet (40 mg total) by mouth at bedtime. (Patient taking differently: Take 40 mg by mouth 2 (two) times daily. One in the morning and once in the evening.) 30 tablet 0  ?? Thiamine HCl (B-1 PO) Take by mouth.    ?? topiramate (TOPAMAX) 50 MG tablet Take 100 mg by mouth at bedtime.    ?? traZODone (DESYREL) 50 MG tablet Take 50 mg by mouth at bedtime.    ?? Vitamin D, Ergocalciferol, (DRISDOL) 50000 units CAPS capsule Take 50,000 Units by mouth every 7 (seven) days.    ? ?No current facility-administered medications for this visit.  ? ?No results found. ? ?Review of Systems:   ?A ROS was performed including pertinent positives and negatives as documented in the HPI. ? ?Physical Exam :    ?Constitutional: NAD and appears stated age ?Neurological: Alert and oriented ?Psych: Appropriate affect and cooperative ?Last menstrual period 11/18/2010.  ? ?Comprehensive Musculoskeletal Exam:   ? ?  ?Musculoskeletal Exam  ?Gait Normal  ?Alignment Normal  ? Right Left  ?Inspection Normal Normal  ?Palpation    ?Tenderness None Medial, patellofemoral  ?Crepitus None  None  ?Effusion None None  ?Range of Motion    ?Extension 0 0  ?Flexion 135 135  ?Strength    ?Extension 5/5 5/5  ?Flexion 5/5 5/5  ?Ligament Exam     ?Generalized Laxity No No  ?Lachman Negative Negative   ?Pivot Shift Negative Negative  ?Anterior Drawer Negative Negative  ?Valgus at 0 Negative Negative  ?Valgus at 20 Negative Negative  ?Varus at 0 0 0  ?Varus at 20   0 0  ?Posterior Drawer at 90 0 0  ?Vascular/Lymphatic Exam    ?Edema None None  ?Venous Stasis Changes No No  ?Distal Circulation Normal Normal  ?Neurologic    ?Light Touch Sensation Intact Intact  ?Special Tests: Tenderness about lateral IT band about Gertie's tubercle  ? ? ? ?Imaging:   ?Xray (4 views left knee): ?Normal ? ? ?I personally reviewed and interpreted the radiographs. ? ? ?Assessment:   ?46 y.o. female with left knee pain for approximately a year.  I described that I do believe that there are a few components of the pain.  I do believe that a component of this is patellofemoral in nature and to that effect I would like her to work with physical therapy in order to strengthen up her hip and quad.  She is also having some tenderness about the IT band which I believe is consistent with IT band tendinitis.  I would like physical therapy to work with her on IT stretching program as well.  There is some medial joint line tenderness which I do believe would be more consistent with a meniscal issue.  She does complain of popping and catching in the knee as well.  At this time after discussion of options she would prefer to have an injection in the IT band of the left knee as well  as intra-articular.  I would also like to refer for physical therapy for the above reasons.  If she is not improving we would consider an MRI ? ?Plan :   ? ?-Plan for left knee ultrasound-guided injection after ver

## 2021-10-23 ENCOUNTER — Encounter: Payer: Self-pay | Admitting: Gastroenterology

## 2021-11-03 ENCOUNTER — Encounter: Payer: Self-pay | Admitting: Gastroenterology

## 2021-11-03 ENCOUNTER — Other Ambulatory Visit: Payer: 59

## 2021-11-03 ENCOUNTER — Other Ambulatory Visit: Payer: Self-pay | Admitting: Obstetrics & Gynecology

## 2021-11-03 ENCOUNTER — Ambulatory Visit (INDEPENDENT_AMBULATORY_CARE_PROVIDER_SITE_OTHER): Payer: 59 | Admitting: Gastroenterology

## 2021-11-03 VITALS — BP 120/80 | HR 74 | Ht 66.0 in | Wt 162.0 lb

## 2021-11-03 DIAGNOSIS — R140 Abdominal distension (gaseous): Secondary | ICD-10-CM | POA: Diagnosis not present

## 2021-11-03 DIAGNOSIS — K529 Noninfective gastroenteritis and colitis, unspecified: Secondary | ICD-10-CM | POA: Diagnosis not present

## 2021-11-03 DIAGNOSIS — R1319 Other dysphagia: Secondary | ICD-10-CM

## 2021-11-03 DIAGNOSIS — R143 Flatulence: Secondary | ICD-10-CM | POA: Diagnosis not present

## 2021-11-03 MED ORDER — METRONIDAZOLE 500 MG PO TABS
500.0000 mg | ORAL_TABLET | Freq: Two times a day (BID) | ORAL | 0 refills | Status: DC
Start: 2021-11-03 — End: 2021-11-27

## 2021-11-03 NOTE — Patient Instructions (Signed)
If you are age 47 or older, your body mass index should be between 23-30. Your Body mass index is 26.15 kg/m. If this is out of the aforementioned range listed, please consider follow up with your Primary Care Provider.  If you are age 71 or younger, your body mass index should be between 19-25. Your Body mass index is 26.15 kg/m. If this is out of the aformentioned range listed, please consider follow up with your Primary Care Provider.   ________________________________________________________  The [facility] providers would like to encourage you to use [facility] to communicate with providers for non-urgent requests or questions.  Due to long hold times on the telephone, sending your provider a message by [facility] may be a faster and more efficient way to get a response.  Please allow 48 business hours for a response.  Please remember that this is for non-urgent requests.  _______________________________________________________  We have sent the following medications to your pharmacy for you to pick up at your convenience: Flagyl '500mg'$  1 by mouth twice a day.  It was a pleasure to see you today!  Thank you for trusting me with your gastrointestinal care!
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
## 2021-11-04 ENCOUNTER — Other Ambulatory Visit: Payer: 59

## 2021-11-04 DIAGNOSIS — R140 Abdominal distension (gaseous): Secondary | ICD-10-CM

## 2021-11-04 DIAGNOSIS — R143 Flatulence: Secondary | ICD-10-CM

## 2021-11-04 DIAGNOSIS — K529 Noninfective gastroenteritis and colitis, unspecified: Secondary | ICD-10-CM

## 2021-11-04 DIAGNOSIS — R1319 Other dysphagia: Secondary | ICD-10-CM

## 2021-11-07 ENCOUNTER — Encounter (HOSPITAL_COMMUNITY): Payer: Self-pay

## 2021-11-07 ENCOUNTER — Ambulatory Visit (HOSPITAL_COMMUNITY)
Admission: EM | Admit: 2021-11-07 | Discharge: 2021-11-07 | Disposition: A | Discharge status: Home / Self Care | Payer: 59 | Attending: Physician Assistant | Admitting: Physician Assistant

## 2021-11-07 ENCOUNTER — Encounter: Payer: Self-pay | Admitting: Hematology and Oncology

## 2021-11-07 ENCOUNTER — Ambulatory Visit (INDEPENDENT_AMBULATORY_CARE_PROVIDER_SITE_OTHER): Payer: 59

## 2021-11-07 DIAGNOSIS — M79632 Pain in left forearm: Secondary | ICD-10-CM | POA: Diagnosis not present

## 2021-11-07 DIAGNOSIS — W19XXXA Unspecified fall, initial encounter: Secondary | ICD-10-CM

## 2021-11-07 DIAGNOSIS — T148XXA Other injury of unspecified body region, initial encounter: Secondary | ICD-10-CM | POA: Diagnosis not present

## 2021-11-07 NOTE — ED Provider Notes (Signed)
[place]   MRN: [id number] DOB: 08/31/1975  Subjective:   Ana Rose is a 46 y.o. female presenting for left forearm and wrist pain x3 hours.  She states that she was relaxing poolside and went to get up, tripped over legs of a chair, fell on outstretched hand and hurt left wrist and forearm.  Her glasses on the left side of her face also hit the ground and caused a small cut on her left eyebrow.  Patient denies any loss of consciousness.  She denies any headache or dizziness at this time.  She is only complaining about limited range of motion and pain in her arm.  Denies any prior injuries or surgeries to this area.  She does report having some tingling feeling going up into her upper arm.  No tingling sensation in her hands or fingers.  No current facility-administered medications for this encounter.  Current Outpatient Medications:    acamprosate (CAMPRAL) 333 MG tablet, Take 666 mg by mouth 3 (three) times daily., Disp: , Rfl:    acetaminophen (TYLENOL) 325 MG tablet, Tylenol  prn, Disp: , Rfl:    cyanocobalamin (,VITAMIN B-12,) 1000 MCG/ML injection, , Disp: , Rfl:    estradiol (ESTRACE) 1 MG tablet, Take 1 tablet (1 mg total) by mouth daily., Disp: 90 tablet, Rfl: 2   folic acid (FOLVITE) 1 MG tablet, Take 1 mg by mouth daily., Disp: , Rfl:    guanFACINE (TENEX) 1 MG tablet, Take 1 mg by mouth 2 (two) times daily., Disp: , Rfl:    hydrOXYzine (VISTARIL) 50 MG capsule, Take by mouth., Disp: , Rfl:    lamoTRIgine (LAMICTAL) 100 MG tablet, Take 100 mg by mouth daily., Disp: , Rfl:    melatonin 3 MG TABS tablet, 1 tablet at bedtime as needed with food, Disp: , Rfl:    metroNIDAZOLE (FLAGYL) 500 MG tablet, Take 1 tablet (500 mg total) by mouth 2 (two) times daily., Disp: 28 tablet, Rfl: 0   Multiple Vitamins-Minerals (ONCOVITE) TABS, Take 1 tablet by mouth daily., Disp: , Rfl:    ondansetron (ZOFRAN) 4 MG tablet, Take 4 mg by mouth daily., Disp: , Rfl:     pantoprazole (PROTONIX) 40 MG tablet, Take 1 tablet (40 mg total) by mouth at bedtime. (Patient not taking: Reported on 11/03/2021), Disp: 30 tablet, Rfl: 0   Thiamine HCl (B-1 PO), Take by mouth. (Patient not taking: Reported on 11/03/2021), Disp: , Rfl:    topiramate (TOPAMAX) 50 MG tablet, Take 100 mg by mouth at bedtime. (Patient not taking: Reported on 11/03/2021), Disp: , Rfl:    traZODone (DESYREL) 50 MG tablet, Take 50 mg by mouth at bedtime., Disp: , Rfl:    Vitamin D, Ergocalciferol, (DRISDOL) 50000 units CAPS capsule, Take 50,000 Units by mouth every 7 (seven) days., Disp: , Rfl:    Allergies  Allergen Reactions   Hydrocodone Nausea And Vomiting    vicodin   Lactose Intolerance (Gi) Nausea And Vomiting   Nsaids Other (See Comments)    Oral NSAIDS contraindicated due to Gastric Bypass   Xanax [Alprazolam] Other (See Comments)    Over sedation    Past Medical History:  Diagnosis Date   ADD (attention deficit disorder)    Alcoholism ([place])    Anemia    Anxiety    Bipolar disorder (H[facility])    CIN I (cervical intraepithelial neoplasia I)    Congestive heart failure (CHF) (HCC)    Depression    Diabetes ([place])  Epileptic seizures ([place])    Last seizure at 46 years of age   GERD (gastroesophageal reflux disease)    Heart murmur    funtional   Hiatal hernia    History of dysfunctional uterine bleeding    Hypercholesteremia 08/31/2016   Noted in note   Hypothyroidism    Irritable bowel syndrome (IBS)    Obesity    PONV (postoperative nausea and vomiting)    Suicide attempt ([place]) 08/24/2012     Past Surgical History:  Procedure Laterality Date   CHOLECYSTECTOMY  2001   COLONOSCOPY  05/27/2017   CYST EXCISION Right 10/24/2018   Procedure: Excision of right inner thigh sebaceous cyst;  Surgeon: Wallace Going, DO;  Location: [place];  Service: Plastics;  Laterality: Right;   ESOPHAGOGASTRODUODENOSCOPY  05/2012   GASTRIC BYPASS  2010   GYNECOLOGIC  CRYOSURGERY  Approximately age 14   LAPAROSCOPIC TOTAL HYSTERECTOMY  12/07/2010   endometriosis on fallopian tubes   LAPAROSCOPIC UNILATERAL SALPINGECTOMY Right 10/30/2018   Procedure: LAPAROSCOPIC UNILATERAL SALPINGECTOMY;  Surgeon: [name], MD;  Location: [place];  Service: Gynecology;  Laterality: Right;   LAPAROSCOPIC UNILATERAL SALPINGO OOPHERECTOMY Left 10/30/2018   Procedure: LAPAROSCOPIC UNILATERAL SALPINGO OOPHORECTOMY;  Surgeon: [name], MD;  Location: [place];  Service: Gynecology;  Laterality: Left;  request 7:30am OR time in [place] block requests one hour   TUBAL LIGATION  2001   VAGINAL HYSTERECTOMY     WISDOM TOOTH EXTRACTION      Family History  Problem Relation Age of Onset   Hypertension Mother    Colon polyps Mother    Irritable bowel syndrome Mother    Hypertension Father    Colon cancer Maternal Grandmother    Crohn's disease Maternal Grandfather    Diabetes Paternal Grandmother    Cancer Paternal Grandmother        OV/UT   Breast cancer Paternal Grandmother 27   Alcoholism Paternal Grandfather    Breast cancer Cousin 1       MATERNAL   Stomach cancer Neg Hx    Esophageal cancer Neg Hx     Social History   Tobacco Use   Smoking status: Former    Types: Cigarettes    Quit date: 11/30/1998    Years since quitting: 22.9   Smokeless tobacco: Never  Vaping Use   Vaping Use: Some days  Substance Use Topics   Alcohol use: Not Currently    Alcohol/week: 0.0 standard drinks of alcohol   Drug use: No    ROS REFER TO HPI FOR PERTINENT POSITIVES AND NEGATIVES   Objective:   Vitals: BP 128/89 (BP Location: Left Arm)   Pulse 79   Temp 98.2 F (36.8 C) (Oral)   Resp 16   LMP 11/18/2010   SpO2 99%   Physical Exam Constitutional:      General: She is not in acute distress.    Appearance: Normal appearance. She is not ill-appearing.  Cardiovascular:     Rate and Rhythm: Normal  rate and regular rhythm.     Pulses: Normal pulses.     Heart sounds: Normal heart sounds.  Pulmonary:     Effort: Pulmonary effort is normal.     Breath sounds: Normal breath sounds.  Musculoskeletal:     Left elbow: Normal. Normal range of motion.     Left forearm: Edema and tenderness present.     Left wrist: Tenderness present. No snuff box tenderness.  Decreased range of motion. Normal pulse.  Skin:    Comments: Miniscule abrasion left eyebrow with surrounding ecchymosis. Left distal forearm abrasion.   Neurological:     General: No focal deficit present.     Mental Status: She is alert.     Cranial Nerves: No cranial nerve deficit.     Sensory: No sensory deficit.     Motor: No weakness.     Gait: Gait normal.  Psychiatric:        Mood and Affect: Mood normal.     No results found for this or any previous visit (from the past 24 hour(s)).  Assessment and Plan :   PDMP not reviewed this encounter.  1. Fall, initial encounter   2. Left forearm pain   3. Abrasion    Trip and fall from standing; no LOC, no red flags, no major head trauma. Tdap up to date. I personally reviewed her XRAYs and interpretation, no acute fracture noted. Placed in sling due to discomfort today. Will have her f/up with outpatient ortho. Rest, Tylenol, Ibuprofen this weekend. Abrasions dressed.  Discharged in stable condition     Donzel Romack, Randa Evens, PA-C 11/07/21 1855

## 2021-11-07 NOTE — Discharge Instructions (Addendum)
No fractures were noted on x-rays today.  You will need to follow-up with orthopedics in the coming week.  Use a sling for stabilization and support.  Tylenol and ibuprofen alternating to help with pain and inflammation.
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
## 2021-11-11 ENCOUNTER — Encounter: Payer: Self-pay | Admitting: Hematology and Oncology

## 2021-11-11 ENCOUNTER — Other Ambulatory Visit (HOSPITAL_BASED_OUTPATIENT_CLINIC_OR_DEPARTMENT_OTHER): Payer: Self-pay

## 2021-11-11 ENCOUNTER — Ambulatory Visit (INDEPENDENT_AMBULATORY_CARE_PROVIDER_SITE_OTHER): Payer: 59 | Admitting: Orthopaedic Surgery

## 2021-11-11 DIAGNOSIS — M2392 Unspecified internal derangement of left knee: Secondary | ICD-10-CM

## 2021-11-11 MED ORDER — TRAMADOL HCL 50 MG PO TABS
50.0000 mg | ORAL_TABLET | Freq: Two times a day (BID) | ORAL | 0 refills | Status: DC | PRN
  Filled 2021-11-11: qty 30, 15d supply, fill #0

## 2021-11-11 MED ORDER — TRAMADOL HCL 50 MG PO TABS
50.0000 mg | ORAL_TABLET | Freq: Four times a day (QID) | ORAL | 0 refills | Status: DC | PRN
  Filled 2021-11-11: qty 14, 4d supply, fill #0

## 2021-11-11 NOTE — Progress Notes (Signed)
Chief Complaint: Left knee pain,, left forearm pain     History of Present Illness:   11/11/2021: Presents today for follow-up of both the left knee as well as the left forearm.  She unfortunately had a fall on 17 June which she landed directly on the line chair and impacted the forearm into this.  Since that time she has had pain and swelling about the dorsal aspect of the left forearm.  She does have pain with wrist extension and supination although this is generally improving.  She has not taken any pain medication for this.  She is also stating that the left knee is painful about the medial aspect of the knee.  She any relief from the injection with this regard.  She did get improved pain with regard to her IT band tenderness although the medial joint remains very significantly tender.  She is having a difficult time walking and doing basic activities.  She does have an upcoming wedding and is quite concerned about basic activities like dancing.   Ana Rose is a 46 y.o. female presents with 1 year of left knee pain which has been ongoing now and worsening over the last several months.  She does have a history of right knee patellofemoral type pain which was previously treated at [facility]  She is here today for the left knee.  She states that she is tender about the medial aspect of the left knee.  She has not had any injections or physical therapy for this side.  She enjoys doing gardening in her spare time.  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  She is here today with her wife.  She does have pain going up and down stairs and with bending of the knee.  She has pain in the knee with longer drives    Surgical History:   None  PMH/PSH/Family History/Social History/Meds/Allergies:    Past Medical History:  Diagnosis Date   ADD (attention deficit disorder)    Alcoholism ([place])    Anemia    Anxiety    Bipolar  disorder (HCC)    CIN I (cervical intraepithelial neoplasia I)    Congestive heart failure (CHF) (HCC)    Depression    Diabetes (HCC)    Epileptic seizures ([place])    Last seizure at 46 years of age   GERD (gastroesophageal reflux disease)    Heart murmur    funtional   Hiatal hernia    History of dysfunctional uterine bleeding    Hypercholesteremia 08/31/2016   Noted in note   Hypothyroidism    Irritable bowel syndrome (IBS)    Obesity    PONV (postoperative nausea and vomiting)    Suicide attempt ([place]) 08/24/2012   Past Surgical History:  Procedure Laterality Date   CHOLECYSTECTOMY  2001   COLONOSCOPY  05/27/2017   CYST EXCISION Right 10/24/2018   Procedure: Excision of right inner thigh sebaceous cyst;  Surgeon: Wallace Going, DO;  Location: [facility];  Service: Plastics;  Laterality: Right;   ESOPHAGOGASTRODUODENOSCOPY  05/2012   GASTRIC BYPASS  2010   GYNECOLOGIC CRYOSURGERY  Approximately age 46   LAPAROSCOPIC TOTAL HYSTERECTOMY  12/07/2010   endometriosis on fallopian tubes   LAPAROSCOPIC UNILATERAL SALPINGECTOMY Right 10/30/2018   Procedure: LAPAROSCOPIC  UNILATERAL SALPINGECTOMY;  Surgeon: [name], MD;  Location: [facility];  Service: Gynecology;  Laterality: Right;   LAPAROSCOPIC UNILATERAL SALPINGO OOPHERECTOMY Left 10/30/2018   Procedure: LAPAROSCOPIC UNILATERAL SALPINGO OOPHORECTOMY;  Surgeon: [name], MD;  Location: [place];  Service: Gynecology;  Laterality: Left;  request 7:30am OR time in [place] block requests one hour   TUBAL LIGATION  2001   VAGINAL HYSTERECTOMY     WISDOM TOOTH EXTRACTION     Social History   Socioeconomic History   Marital status: Married    Spouse name: Not on file   Number of children: 1   Years of education: Not on file   Highest education level: Not on file  Occupational History   Occupation: insurance agent  Tobacco Use   Smoking status:  Former    Types: Cigarettes    Quit date: 11/30/1998    Years since quitting: 22.9   Smokeless tobacco: Never  Vaping Use   Vaping Use: Some days  Substance and Sexual Activity   Alcohol use: Not Currently    Alcohol/week: 0.0 standard drinks of alcohol   Drug use: No   Sexual activity: Yes    Partners: Female    Birth control/protection: Surgical    Comment: HYST.-1st intercourse 46 yo-Fewer than 5 partners  Other Topics Concern   Not on file  Social History Narrative   Not on file   Social Determinants of Health   Financial Resource Strain: Not on file  Food Insecurity: Not on file  Transportation Needs: Not on file  Physical Activity: Not on file  Stress: Not on file  Social Connections: Not on file   Family History  Problem Relation Age of Onset   Hypertension Mother    Colon polyps Mother    Irritable bowel syndrome Mother    Hypertension Father    Colon cancer Maternal Grandmother    Crohn's disease Maternal Grandfather    Diabetes Paternal Grandmother    Cancer Paternal Grandmother        OV/UT   Breast cancer Paternal Grandmother 40   Alcoholism Paternal Grandfather    Breast cancer Cousin 55       MATERNAL   Stomach cancer Neg Hx    Esophageal cancer Neg Hx    Allergies  Allergen Reactions   Hydrocodone Nausea And Vomiting    vicodin   Lactose Intolerance (Gi) Nausea And Vomiting   Nsaids Other (See Comments)    Oral NSAIDS contraindicated due to Gastric Bypass   Xanax [Alprazolam] Other (See Comments)    Over sedation   Current Outpatient Medications  Medication Sig Dispense Refill   acamprosate (CAMPRAL) 333 MG tablet Take 666 mg by mouth 3 (three) times daily.     acetaminophen (TYLENOL) 325 MG tablet Tylenol  prn     cyanocobalamin (,VITAMIN B-12,) 1000 MCG/ML injection      estradiol (ESTRACE) 1 MG tablet Take 1 tablet (1 mg total) by mouth daily. 90 tablet 2   folic acid (FOLVITE) 1 MG tablet Take 1 mg by mouth daily.     guanFACINE  (TENEX) 1 MG tablet Take 1 mg by mouth 2 (two) times daily.     hydrOXYzine (VISTARIL) 50 MG capsule Take by mouth.     lamoTRIgine (LAMICTAL) 100 MG tablet Take 100 mg by mouth daily.     melatonin 3 MG TABS tablet 1 tablet at bedtime as needed with food     metroNIDAZOLE (FLAGYL)  500 MG tablet Take 1 tablet (500 mg total) by mouth 2 (two) times daily. 28 tablet 0   Multiple Vitamins-Minerals (ONCOVITE) TABS Take 1 tablet by mouth daily.     ondansetron (ZOFRAN) 4 MG tablet Take 4 mg by mouth daily.     pantoprazole (PROTONIX) 40 MG tablet Take 1 tablet (40 mg total) by mouth at bedtime. (Patient not taking: Reported on 11/03/2021) 30 tablet 0   Thiamine HCl (B-1 PO) Take by mouth. (Patient not taking: Reported on 11/03/2021)     topiramate (TOPAMAX) 50 MG tablet Take 100 mg by mouth at bedtime. (Patient not taking: Reported on 11/03/2021)     traZODone (DESYREL) 50 MG tablet Take 50 mg by mouth at bedtime.     Vitamin D, Ergocalciferol, (DRISDOL) 50000 units CAPS capsule Take 50,000 Units by mouth every 7 (seven) days.     No current facility-administered medications for this visit.   No results found.  Review of Systems:   A ROS was performed including pertinent positives and negatives as documented in the HPI.  Physical Exam :   Constitutional: NAD and appears stated age Neurological: Alert and oriented Psych: Appropriate affect and cooperative Last menstrual period 11/18/2010.   Comprehensive Musculoskeletal Exam:      Musculoskeletal Exam  Gait Normal  Alignment Normal   Right Left  Inspection Normal Normal  Palpation    Tenderness None Medial joint  Crepitus None None  Effusion None None  Range of Motion    Extension 0 0  Flexion 135 135  Strength    Extension 5/5 5/5  Flexion 5/5 5/5  Ligament Exam     Generalized Laxity No No  Lachman Negative Negative   Pivot Shift Negative Negative  Anterior Drawer Negative Negative  Valgus at 0 Negative Negative  Valgus at  20 Negative Negative  Varus at 0 0 0  Varus at 20   0 0  Posterior Drawer at 90 0 0  Vascular/Lymphatic Exam    Edema None None  Venous Stasis Changes No No  Distal Circulation Normal Normal  Neurologic    Light Touch Sensation Intact Intact  Special Tests: Tenderness about the medial joint line, positive McMurray medially     Imaging:   Xray (4 views left knee): Normal   I personally reviewed and interpreted the radiographs.   Assessment:   46 y.o. female with left knee pain for approximately a year.  She did have an injection at the last visit.  She states that this improved her IT band type symptoms and she is continuing to experience all of her pain medially.  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  She continues to have medial based knee pain.  Flat affect I do believe that MRI is indicated in order to assess for any type of underlying meniscal injury.  With regard to the left forearm I do believe this is more specifically a bony contusion and extensor tendinitis.  To that effect she may use the arm as tolerated.  I will plan to prescribe her 2 weeks of tramadol for pain relief that she may be able to mobilize with this. Plan :    -Plan for MRI left knee to discuss results  I believe that advance imaging in the form of an MRI is indicated for the following reasons: -Xrays images were obtained and not diagnostic -The patient has failed treatment modalities including, activity restriction, injection, home strengthening -The following worrisome symptoms  are present on history and exam: Positive McMurray medially on the left       I personally saw and evaluated the patient, and participated in the management and treatment plan.  Vanetta Mulders, MD Attending Physician, Orthopedic Surgery  This document was dictated using Dragon voice recognition software. A reasonable attempt at proof reading has been made to minimize errors.

## 2021-11-17 ENCOUNTER — Encounter: Payer: Self-pay | Admitting: Hematology and Oncology

## 2021-11-17 LAB — C. DIFFICILE GDH AND TOXIN A/B
GDH ANTIGEN: NOT DETECTED
MICRO NUMBER:: 13524914
SPECIMEN QUALITY:: ADEQUATE
TOXIN A AND B: NOT DETECTED

## 2021-11-17 LAB — OVA AND PARASITE EXAMINATION
CONCENTRATE RESULT:: NONE SEEN
MICRO NUMBER:: 13524629
SPECIMEN QUALITY:: ADEQUATE
TRICHROME RESULT:: NONE SEEN

## 2021-11-17 LAB — PANCREATIC ELASTASE, FECAL: Pancreatic Elastase-1, Stool: 288 mcg/g

## 2021-11-18 ENCOUNTER — Encounter: Payer: Self-pay | Admitting: Gastroenterology

## 2021-11-18 DIAGNOSIS — K529 Noninfective gastroenteritis and colitis, unspecified: Secondary | ICD-10-CM

## 2021-11-18 NOTE — Telephone Encounter (Signed)
Dr. Lorenso Courier as DOD PM of 11/18/21 - please advise, thanks.  Dr. Loletha Carrow' pt recently treated with Flagyl for diarrhea, stool tests negative, C. Diff PCR pending. Pt reports ongoing symptoms despite course of Flagyl. Dr. Loletha Carrow is out of the office.

## 2021-11-18 NOTE — Telephone Encounter (Signed)
X-ray order in epic. Pt notified via [place].

## 2021-11-19 ENCOUNTER — Ambulatory Visit (HOSPITAL_BASED_OUTPATIENT_CLINIC_OR_DEPARTMENT_OTHER)
Admission: RE | Admit: 2021-11-19 | Discharge: 2021-11-19 | Disposition: A | Discharge status: Home / Self Care | Payer: 59 | Source: Ambulatory Visit | Attending: Orthopaedic Surgery | Admitting: Orthopaedic Surgery

## 2021-11-19 DIAGNOSIS — M1712 Unilateral primary osteoarthritis, left knee: Secondary | ICD-10-CM | POA: Diagnosis not present

## 2021-11-19 DIAGNOSIS — M2392 Unspecified internal derangement of left knee: Secondary | ICD-10-CM | POA: Insufficient documentation

## 2021-11-25 ENCOUNTER — Ambulatory Visit (INDEPENDENT_AMBULATORY_CARE_PROVIDER_SITE_OTHER)
Admission: RE | Admit: 2021-11-25 | Discharge: 2021-11-25 | Disposition: A | Discharge status: Home / Self Care | Payer: 59 | Source: Ambulatory Visit | Attending: Internal Medicine | Admitting: Internal Medicine

## 2021-11-25 DIAGNOSIS — K529 Noninfective gastroenteritis and colitis, unspecified: Secondary | ICD-10-CM | POA: Diagnosis not present

## 2021-11-26 NOTE — Telephone Encounter (Signed)
[place],  Stool testing negative for C. difficile and ova and parasites.   She needs to be scheduled for colonoscopy with me in the [facility] for chronic diarrhea to take biopsies to rule out microscopic colitis.  She might still have small bowel bacterial overgrowth even without improvement on metronidazole.  So I would also like to send a prescription for rifaximin 550 mg, 1 tablet twice daily for 14 days.  -HD

## 2021-11-27 ENCOUNTER — Telehealth: Payer: Self-pay | Admitting: Gastroenterology

## 2021-11-27 DIAGNOSIS — K529 Noninfective gastroenteritis and colitis, unspecified: Secondary | ICD-10-CM

## 2021-11-27 MED ORDER — PLENVU 140 G PO SOLR: 1.0000 | ORAL | 0 refills | Status: DC

## 2021-11-27 MED ORDER — RIFAXIMIN 550 MG PO TABS: 550.0000 mg | ORAL_TABLET | Freq: Two times a day (BID) | ORAL | 0 refills | Status: AC

## 2021-11-27 NOTE — Telephone Encounter (Signed)
Patient called to schedule colonoscopy with Dr. Loletha Carrow.  Does she need a PV first?  She said everything was discussed with her at the visit on 6/13.  Please advise.  Thank you.

## 2021-11-27 NOTE — Telephone Encounter (Signed)
MyChart message from Dr. Loletha Carrow:  Ana Rose,   Stool testing negative for C. difficile and ova and parasites.   She needs to be scheduled for colonoscopy with me in the [facility] for chronic diarrhea to take biopsies to rule out microscopic colitis.   She might still have small bowel bacterial overgrowth even without improvement on metronidazole.   So I would also like to send a prescription for rifaximin 550 mg, 1 tablet twice daily for 14 days.   -HD

## 2021-11-27 NOTE — Telephone Encounter (Signed)
Called and spoke with patient regarding Dr. Loletha Carrow' recommendations. Pt has been scheduled for a colonoscopy  in the [facility] on Friday, 12/04/21 at 2:00 pm. Pt is aware that she will need to arrive at 1 pm with a care partner. Pt is aware that she may have SIBO and we are going to empirically treat with Rifaximin since she did not respond well to metronidazole. Pt would like RX for Rifaximin and [facility]. Pt is aware that I will send her instructions to her via [place]. Pt knows to call or message [place] with any questions. Pt verbalized understanding and had no concerns at the end of the call.  Ambulatory referral to GI in epic. [facility]imin [facility] Colon instructions sent to patient via [place].

## 2021-11-27 NOTE — Telephone Encounter (Signed)
This has been addressed. See 11/27/21 telephone encounter.

## 2021-11-30 ENCOUNTER — Encounter: Payer: Self-pay | Admitting: Gastroenterology

## 2021-12-01 ENCOUNTER — Telehealth: Payer: 59 | Admitting: Family Medicine

## 2021-12-01 DIAGNOSIS — F1029 Alcohol dependence with unspecified alcohol-induced disorder: Secondary | ICD-10-CM | POA: Diagnosis not present

## 2021-12-01 MED ORDER — ACAMPROSATE CALCIUM 333 MG PO TBEC: 666.0000 mg | DELAYED_RELEASE_TABLET | Freq: Three times a day (TID) | ORAL | 0 refills | Status: AC

## 2021-12-01 NOTE — Patient Instructions (Addendum)
Ana Rose, thank you for joining Perlie Mayo, NP for today's virtual visit.  While this provider is not your primary care provider (PCP), if your PCP is located in our provider database this encounter information will be shared with them immediately following your visit.  Consent: (Patient) Dnyla Antonetti provided verbal consent for this virtual visit at the beginning of the encounter.  Current Medications:  Current Outpatient Medications:    acamprosate (CAMPRAL) 333 MG tablet, Take 2 tablets (666 mg total) by mouth 3 (three) times daily., Disp: 180 tablet, Rfl: 0   acetaminophen (TYLENOL) 325 MG tablet, Tylenol  prn, Disp: , Rfl:    cyanocobalamin (,VITAMIN B-12,) 1000 MCG/ML injection, , Disp: , Rfl:    estradiol (ESTRACE) 1 MG tablet, Take 1 tablet (1 mg total) by mouth daily., Disp: 90 tablet, Rfl: 2   folic acid (FOLVITE) 1 MG tablet, Take 1 mg by mouth daily., Disp: , Rfl:    guanFACINE (TENEX) 1 MG tablet, Take 1 mg by mouth 2 (two) times daily., Disp: , Rfl:    hydrOXYzine (VISTARIL) 50 MG capsule, Take by mouth., Disp: , Rfl:    lamoTRIgine (LAMICTAL) 100 MG tablet, Take 100 mg by mouth daily., Disp: , Rfl:    melatonin 3 MG TABS tablet, 1 tablet at bedtime as needed with food, Disp: , Rfl:    Multiple Vitamins-Minerals (ONCOVITE) TABS, Take 1 tablet by mouth daily., Disp: , Rfl:    ondansetron (ZOFRAN) 4 MG tablet, Take 4 mg by mouth daily., Disp: , Rfl:    pantoprazole (PROTONIX) 40 MG tablet, Take 1 tablet (40 mg total) by mouth at bedtime. (Patient not taking: Reported on 11/03/2021), Disp: 30 tablet, Rfl: 0   PEG-KCl-NaCl-NaSulf-Na Asc-C (PLENVU) 140 g SOLR, Take 1 kit by mouth as directed., Disp: 1 each, Rfl: 0   rifaximin (XIFAXAN) 550 MG TABS tablet, Take 1 tablet (550 mg total) by mouth 2 (two) times daily for 14 days., Disp: 28 tablet, Rfl: 0   Thiamine HCl (B-1 PO), Take by mouth. (Patient not taking: Reported on 11/03/2021), Disp: , Rfl:    topiramate (TOPAMAX) 50  MG tablet, Take 100 mg by mouth at bedtime. (Patient not taking: Reported on 11/03/2021), Disp: , Rfl:    traMADol (ULTRAM) 50 MG tablet, Take 1 tablet (50 mg total) by mouth every 12 (twelve) hours as needed., Disp: 30 tablet, Rfl: 0   traZODone (DESYREL) 50 MG tablet, Take 50 mg by mouth at bedtime., Disp: , Rfl:    Vitamin D, Ergocalciferol, (DRISDOL) 50000 units CAPS capsule, Take 50,000 Units by mouth every 7 (seven) days., Disp: , Rfl:    Medications ordered in this encounter:  Meds ordered this encounter  Medications   acamprosate (CAMPRAL) 333 MG tablet    Sig: Take 2 tablets (666 mg total) by mouth 3 (three) times daily.    Dispense:  180 tablet    Refill:  0    Order Specific Question:   Supervising Provider    Answer:   Sabra Heck, BRIAN [[id number]]     *If you need refills on other medications prior to your next appointment, please contact your pharmacy*  Follow-Up: Call back or seek an in-person evaluation if the symptoms worsen or if the condition fails to improve as anticipated.  Other Instructions Take medication has instructed  [facility]    If you have been instructed to have an in-person evaluation today at a local  Urgent Care facility, please use the link below. It will take you to a list of all of our available [facility], including address, phone number and hours of operation. Please do not delay care.  [facility]  If you or a family member do not have a primary care provider, use the link below to schedule a visit and establish care. When you choose a [facility] primary care physician or advanced practice provider, you gain a long-term partner in health. Find a Primary Care Provider  Learn more about [facility]'s in-office and virtual care options: [facility]

## 2021-12-01 NOTE — Progress Notes (Signed)
Virtual Visit Consent   Ana Rose, you are scheduled for a virtual visit with a [facility] provider today. Just as with appointments in the office, your consent must be obtained to participate. Your consent will be active for this visit and any virtual visit you may have with one of our providers in the next 365 days. If you have a MyChart account, a copy of this consent can be sent to you electronically.  As this is a virtual visit, video technology does not allow for your provider to perform a traditional examination. This may limit your provider's ability to fully assess your condition. If your provider identifies any concerns that need to be evaluated in person or the need to arrange testing (such as labs, EKG, etc.), we will make arrangements to do so. Although advances in technology are sophisticated, we cannot ensure that it will always work on either your end or our end. If the connection with a video visit is poor, the visit may have to be switched to a telephone visit. With either a video or telephone visit, we are not always able to ensure that we have a secure connection.  By engaging in this virtual visit, you consent to the provision of healthcare and authorize for your insurance to be billed (if applicable) for the services provided during this visit. Depending on your insurance coverage, you may receive a charge related to this service.  I need to obtain your verbal consent now. Are you willing to proceed with your visit today? [name] has provided verbal consent on 12/01/2021 for a virtual visit (video or telephone). Perlie Mayo, NP  Date: 12/01/2021 9:16 AM  Virtual Visit via Video Note   I, Perlie Mayo, connected with  Ana Rose  ([id number], 04/29/1976) on 12/01/21 at  9:15 AM EDT by a video-enabled telemedicine application and verified that I am speaking with the correct person using two identifiers.  Location: Patient: Virtual Visit Location Patient:  Home Provider: Virtual Visit Location Provider: Home Office   I discussed the limitations of evaluation and management by telemedicine and the availability of in person appointments. The patient expressed understanding and agreed to proceed.    History of Present Illness: Ana Rose is a 46 y.o. who identifies as a female who was assigned female at birth, and is being seen today for refill of Campral. Has PCP appt on July 31 at [facility] with Dr Zadie Rhine- she calls daily to see if she can be seen earlier. She needs refill as this current script is about to run out. It was provided to her by a rehab facility in [place] back in Spring of this year. Note in Epic for review. She reports her last drink was April 6th of this year. Has good family support, does not want to miss days of medication and would like refill to prevent relapsing.    Problems:  Patient Active Problem List   Diagnosis Date Noted   Normocytic anemia 09/24/2020   Nasal trauma, initial encounter 09/13/2018   Sebaceous cyst 08/01/2018   Bipolar disorder, unspecified ([place]) 08/25/2012   Alcohol dependence ([place]) 08/25/2012   Epileptic seizures ([place])    ADD (attention deficit disorder)     Allergies:  Allergies  Allergen Reactions   Hydrocodone Nausea And Vomiting    vicodin   Lactose Intolerance (Gi) Nausea And Vomiting   Nsaids Other (See Comments)    Oral NSAIDS contraindicated due to Gastric Bypass   Xanax [Alprazolam] Other (See Comments)  Over sedation   Medications:  Current Outpatient Medications:    acamprosate (CAMPRAL) 333 MG tablet, Take 666 mg by mouth 3 (three) times daily., Disp: , Rfl:    acetaminophen (TYLENOL) 325 MG tablet, Tylenol  prn, Disp: , Rfl:    cyanocobalamin (,VITAMIN B-12,) 1000 MCG/ML injection, , Disp: , Rfl:    estradiol (ESTRACE) 1 MG tablet, Take 1 tablet (1 mg total) by mouth daily., Disp: 90 tablet, Rfl: 2   folic acid (FOLVITE) 1 MG tablet, Take 1 mg by mouth daily., Disp: , Rfl:     guanFACINE (TENEX) 1 MG tablet, Take 1 mg by mouth 2 (two) times daily., Disp: , Rfl:    hydrOXYzine (VISTARIL) 50 MG capsule, Take by mouth., Disp: , Rfl:    lamoTRIgine (LAMICTAL) 100 MG tablet, Take 100 mg by mouth daily., Disp: , Rfl:    melatonin 3 MG TABS tablet, 1 tablet at bedtime as needed with food, Disp: , Rfl:    Multiple Vitamins-Minerals (ONCOVITE) TABS, Take 1 tablet by mouth daily., Disp: , Rfl:    ondansetron (ZOFRAN) 4 MG tablet, Take 4 mg by mouth daily., Disp: , Rfl:    pantoprazole (PROTONIX) 40 MG tablet, Take 1 tablet (40 mg total) by mouth at bedtime. (Patient not taking: Reported on 11/03/2021), Disp: 30 tablet, Rfl: 0   PEG-KCl-NaCl-NaSulf-Na Asc-C (PLENVU) 140 g SOLR, Take 1 kit by mouth as directed., Disp: 1 each, Rfl: 0   rifaximin (XIFAXAN) 550 MG TABS tablet, Take 1 tablet (550 mg total) by mouth 2 (two) times daily for 14 days., Disp: 28 tablet, Rfl: 0   Thiamine HCl (B-1 PO), Take by mouth. (Patient not taking: Reported on 11/03/2021), Disp: , Rfl:    topiramate (TOPAMAX) 50 MG tablet, Take 100 mg by mouth at bedtime. (Patient not taking: Reported on 11/03/2021), Disp: , Rfl:    traMADol (ULTRAM) 50 MG tablet, Take 1 tablet (50 mg total) by mouth every 12 (twelve) hours as needed., Disp: 30 tablet, Rfl: 0   traZODone (DESYREL) 50 MG tablet, Take 50 mg by mouth at bedtime., Disp: , Rfl:    Vitamin D, Ergocalciferol, (DRISDOL) 50000 units CAPS capsule, Take 50,000 Units by mouth every 7 (seven) days., Disp: , Rfl:   Observations/Objective: Patient is well-developed, well-nourished in no acute distress.  Resting comfortably  at home.  Head is normocephalic, atraumatic.  No labored breathing.  Speech is clear and coherent with logical content.  Patient is alert and oriented at baseline.    Assessment and Plan: 1. Alcohol dependence with unspecified alcohol-induced disorder (HCC)  - acamprosate (CAMPRAL) 333 MG tablet; Take 2 tablets (666 mg total) by mouth 3  (three) times daily.  Dispense: 180 tablet; Refill: 0  -last drink was 08/27/21 -has been taking medication as directed  -PCP appt is too far out to make it without refill -desires to continue to not drink -has family support   Reviewed side effects, risks and benefits of medication.    Patient acknowledged agreement and understanding of the plan.   Past Medical, Surgical, Social History, Allergies, and Medications have been Reviewed.    Follow Up Instructions: I discussed the assessment and treatment plan with the patient. The patient was provided an opportunity to ask questions and all were answered. The patient agreed with the plan and demonstrated an understanding of the instructions.  A copy of instructions were sent to the patient via MyChart unless otherwise noted below.     The patient was advised  to call back or seek an in-person evaluation if the symptoms worsen or if the condition fails to improve as anticipated.  Time:  I spent 15 minutes with the patient via telehealth technology discussing the above problems/concerns.    Perlie Mayo, NP

## 2021-12-04 ENCOUNTER — Ambulatory Visit (AMBULATORY_SURGERY_CENTER): Payer: 59 | Admitting: Gastroenterology

## 2021-12-04 ENCOUNTER — Encounter: Payer: Self-pay | Admitting: Gastroenterology

## 2021-12-04 ENCOUNTER — Other Ambulatory Visit: Payer: Self-pay | Admitting: Gastroenterology

## 2021-12-04 VITALS — BP 108/72 | HR 58 | Temp 97.3°F | Resp 12 | Ht 66.0 in | Wt 162.0 lb

## 2021-12-04 DIAGNOSIS — K635 Polyp of colon: Secondary | ICD-10-CM

## 2021-12-04 DIAGNOSIS — K529 Noninfective gastroenteritis and colitis, unspecified: Secondary | ICD-10-CM | POA: Diagnosis not present

## 2021-12-04 DIAGNOSIS — K648 Other hemorrhoids: Secondary | ICD-10-CM | POA: Diagnosis not present

## 2021-12-04 DIAGNOSIS — D120 Benign neoplasm of cecum: Secondary | ICD-10-CM

## 2021-12-04 MED ORDER — SODIUM CHLORIDE 0.9 % IV SOLN: 500.0000 mL | Freq: Once | INTRAVENOUS | Status: AC

## 2021-12-04 MED ORDER — DIPHENOXYLATE-ATROPINE 2.5-0.025 MG PO TABS: 1.0000 | ORAL_TABLET | Freq: Four times a day (QID) | ORAL | 1 refills | Status: DC | PRN

## 2021-12-04 NOTE — Progress Notes (Signed)
Called to room to assist during endoscopic procedure.  Patient ID and intended procedure confirmed with present staff. Received instructions for my participation in the procedure from the performing physician.  

## 2021-12-04 NOTE — Progress Notes (Signed)
Abd binder on pt before sedation.  Pt reported no discomfort

## 2021-12-04 NOTE — Progress Notes (Signed)
Transporter reported that the pt. Left the wheelchair,went down the elevator and got in the car.

## 2021-12-04 NOTE — Progress Notes (Signed)
History and Physical:  This patient presents for endoscopic testing for: Encounter Diagnosis  Name Primary?   Chronic diarrhea Yes    Most clinical details in my office consult note of 11/03/2021. Since then, negative ova and parasite, C. difficile testing and normal fecal elastase. She had no improvement with an empiric trial of rifaximin for concern of SIBO. More recently placed on rifaximin for same clinical concern. She received multiple antibiotics by report during hospitalization in [place] several months ago. Colonoscopy planned today mainly to rule out IBD or microscopic colitis. History of gastric bypass as well as significantly redundant colon with challenging scope passage on last colonoscopy at [facility] in January 2019.  No polyps were discovered on that exam, with subsequent recommendation to do repeat colonoscopy or CT colonography for screening at a 10-year interval.  Patient is otherwise without complaints or active issues today.   Past Medical History: Past Medical History:  Diagnosis Date   ADD (attention deficit disorder)    Alcoholism ([place])    Anemia    Anxiety    Bipolar disorder ([place])    Blood transfusion without reported diagnosis    CIN I (cervical intraepithelial neoplasia I)    Congestive heart failure (CHF) (HCC)    Depression    Diabetes ([place])    Epileptic seizures ([place])    Last seizure at 46 years of age   GERD (gastroesophageal reflux disease)    Heart murmur    funtional   Hiatal hernia    History of dysfunctional uterine bleeding    Hypercholesteremia 08/31/2016   Noted in note   Hypothyroidism    Irritable bowel syndrome (IBS)    Obesity    PONV (postoperative nausea and vomiting)    Suicide attempt ([place]) 08/24/2012     Past Surgical History: Past Surgical History:  Procedure Laterality Date   CHOLECYSTECTOMY  2001   COLONOSCOPY  05/27/2017   CYST EXCISION Right 10/24/2018   Procedure: Excision of right inner thigh sebaceous cyst;   Surgeon: Wallace Going, DO;  Location: [facility];  Service: Plastics;  Laterality: Right;   ESOPHAGOGASTRODUODENOSCOPY  05/2012   GASTRIC BYPASS  2010   GYNECOLOGIC CRYOSURGERY  Approximately age 24   LAPAROSCOPIC TOTAL HYSTERECTOMY  12/07/2010   endometriosis on fallopian tubes   LAPAROSCOPIC UNILATERAL SALPINGECTOMY Right 10/30/2018   Procedure: LAPAROSCOPIC UNILATERAL SALPINGECTOMY;  Surgeon: [name], MD;  Location: [place];  Service: Gynecology;  Laterality: Right;   LAPAROSCOPIC UNILATERAL SALPINGO OOPHERECTOMY Left 10/30/2018   Procedure: LAPAROSCOPIC UNILATERAL SALPINGO OOPHORECTOMY;  Surgeon: [name], MD;  Location: [place];  Service: Gynecology;  Laterality: Left;  request 7:30am OR time in [place] block requests one hour   TUBAL LIGATION  2001   VAGINAL HYSTERECTOMY     WISDOM TOOTH EXTRACTION      Allergies: Allergies  Allergen Reactions   Hydrocodone Nausea And Vomiting    vicodin   Lactose Intolerance (Gi) Nausea And Vomiting   Nsaids Other (See Comments)    Oral NSAIDS contraindicated due to Gastric Bypass   Xanax [Alprazolam] Other (See Comments)    Over sedation    Outpatient Meds: Current Outpatient Medications  Medication Sig Dispense Refill   acamprosate (CAMPRAL) 333 MG tablet Take 2 tablets (666 mg total) by mouth 3 (three) times daily. 180 tablet 0   estradiol (ESTRACE) 1 MG tablet Take 1 tablet (1 mg total) by mouth daily. 90 tablet 2   folic  acid (FOLVITE) 1 MG tablet Take 1 mg by mouth daily.     hydrOXYzine (VISTARIL) 50 MG capsule Take by mouth.     lamoTRIgine (LAMICTAL) 100 MG tablet Take 100 mg by mouth daily.     Multiple Vitamins-Minerals (ONCOVITE) TABS Take 1 tablet by mouth daily.     ondansetron (ZOFRAN) 4 MG tablet Take 4 mg by mouth daily.     rifaximin (XIFAXAN) 550 MG TABS tablet Take 1 tablet (550 mg total) by mouth 2 (two) times daily for 14  days. 28 tablet 0   topiramate (TOPAMAX) 50 MG tablet Take 100 mg by mouth at bedtime.     traZODone (DESYREL) 50 MG tablet Take 50 mg by mouth at bedtime.     Vitamin D, Ergocalciferol, (DRISDOL) 50000 units CAPS capsule Take 50,000 Units by mouth every 7 (seven) days.     acetaminophen (TYLENOL) 325 MG tablet Tylenol  prn (Patient not taking: Reported on 12/04/2021)     cyanocobalamin (,VITAMIN B-12,) 1000 MCG/ML injection  (Patient not taking: Reported on 12/04/2021)     guanFACINE (INTUNIV) 4 MG TB24 ER tablet Take 4 mg by mouth every morning.     melatonin 3 MG TABS tablet 1 tablet at bedtime as needed with food (Patient not taking: Reported on 12/04/2021)     pantoprazole (PROTONIX) 40 MG tablet Take 1 tablet (40 mg total) by mouth at bedtime. (Patient not taking: Reported on 11/03/2021) 30 tablet 0   sertraline (ZOLOFT) 100 MG tablet Take 150 mg by mouth daily.     Thiamine HCl (B-1 PO) Take by mouth. (Patient not taking: Reported on 11/03/2021)     traMADol (ULTRAM) 50 MG tablet Take 1 tablet (50 mg total) by mouth every 12 (twelve) hours as needed. (Patient not taking: Reported on 12/04/2021) 30 tablet 0   Current Facility-Administered Medications  Medication Dose Route Frequency Provider Last Rate Last Admin   0.9 %  sodium chloride infusion  500 mL Intravenous Once Doran Stabler, MD          ___________________________________________________________________ Objective   Exam:  BP 111/75   Pulse 82   Temp (!) 97.3 F (36.3 C)   Ht '5\' 6"'$  (1.676 m)   Wt 162 lb (73.5 kg)   LMP 11/18/2010   SpO2 98%   BMI 26.15 kg/m   CV: RRR without murmur, S1/S2 Resp: clear to auscultation bilaterally, normal RR and effort noted GI: soft, no tenderness, with active bowel sounds.   Assessment: Encounter Diagnosis  Name Primary?   Chronic diarrhea Yes     Plan: Colonoscopy  The benefits and risks of the planned procedure were described in detail with the patient or (when  appropriate) their health care proxy.  Risks were outlined as including, but not limited to, bleeding, infection, perforation, adverse medication reaction leading to cardiac or pulmonary decompensation, pancreatitis (if ERCP).  The limitation of incomplete mucosal visualization was also discussed.  No guarantees or warranties were given.    The patient is appropriate for an endoscopic procedure in the ambulatory setting.   - Wilfrid Lund, MD

## 2021-12-04 NOTE — Progress Notes (Signed)
Report to PACU, RN, vss, BBS= Clear.  Abd binder release at end of procedure

## 2021-12-04 NOTE — Patient Instructions (Addendum)
Impression/Recommendations:  Polyp and hemorrhoid handouts given to patient.  Resume previous diet. Continue present medications. Await pathology results.  Repeat colonoscopy recommended for surveillance.  Date to be determined after pathology results reviewed.  Return to Dr. Loletha Carrow office at appointment to be scheduled.   Prescription to be sent in for Lomotil.  YOU HAD AN ENDOSCOPIC PROCEDURE TODAY AT [facility]:   Refer to the procedure report that was given to you for any specific questions about what was found during the examination.  If the procedure report does not answer your questions, please call your gastroenterologist to clarify.  If you requested that your care partner not be given the details of your procedure findings, then the procedure report has been included in a sealed envelope for you to review at your convenience later.  YOU SHOULD EXPECT: Some feelings of bloating in the abdomen. Passage of more gas than usual.  Walking can help get rid of the air that was put into your GI tract during the procedure and reduce the bloating. If you had a lower endoscopy (such as a colonoscopy or flexible sigmoidoscopy) you may notice spotting of blood in your stool or on the toilet paper. If you underwent a bowel prep for your procedure, you may not have a normal bowel movement for a few days.  Please Note:  You might notice some irritation and congestion in your nose or some drainage.  This is from the oxygen used during your procedure.  There is no need for concern and it should clear up in a day or so.  SYMPTOMS TO REPORT IMMEDIATELY:  Following lower endoscopy (colonoscopy or flexible sigmoidoscopy):  Excessive amounts of blood in the stool  Significant tenderness or worsening of abdominal pains  Swelling of the abdomen that is new, acute  Fever of 100F or higher For urgent or emergent issues, a gastroenterologist can be reached at any hour by calling [phone]  [phone]. Do not use MyChart messaging for urgent concerns.    DIET:  We do recommend a small meal at first, but then you may proceed to your regular diet.  Drink plenty of fluids but you should avoid alcoholic beverages for 24 hours.  ACTIVITY:  You should plan to take it easy for the rest of today and you should NOT DRIVE or use heavy machinery until tomorrow (because of the sedation medicines used during the test).    FOLLOW UP: Our staff will call the number listed on your records the next business day following your procedure.  We will call around 7:15- 8:00 am to check on you and address any questions or concerns that you may have regarding the information given to you following your procedure. If we do not reach you, we will leave a message.  If you develop any symptoms (ie: fever, flu-like symptoms, shortness of breath, cough etc.) before then, please call [phone].  If you test positive for Covid 19 in the 2 weeks post procedure, please call and report this information to [place].    If any biopsies were taken you will be contacted by phone or by letter within the next 1-3 weeks.  Please call us at [phone] if you have not heard about the biopsies in 3 weeks.    SIGNATURES/CONFIDENTIALITY: You and/or your care partner have signed paperwork which will be entered into your electronic medical record.  These signatures attest to the fact that that the information above on your After Visit Summary has been  reviewed and is understood.  Full responsibility of the confidentiality of this discharge information lies with you and/or your care-partner.

## 2021-12-04 NOTE — Op Note (Signed)
[facility] Patient Name: Azayla Polo Procedure Date: 12/04/2021 11:30 AM MRN: [id number] Endoscopist: Mallie Mussel L. Loletha Carrow , MD Age: 46 Referring MD:  Date of Birth: 08-11-1975 Gender: Female Account #: [id number] Procedure:                Colonoscopy Indications:              Chronic diarrhea                           clinical details in recent GI office consult note.                           subsequent negative stool testing for O/P and C                            diff, normal fecal elastase                           no improvement with trial of metronidazole (empiric                            SIBO Rx, h/o GBP)                           currently in midst of rifaximin treatment course                           Last colonoscopy at [facility] Jan 2019 without                            polyps Medicines:                Monitored Anesthesia Care Procedure:                Pre-Anesthesia Assessment:                           - Prior to the procedure, a History and Physical                            was performed, and patient medications and                            allergies were reviewed. The patient's tolerance of                            previous anesthesia was also reviewed. The risks                            and benefits of the procedure and the sedation                            options and risks were discussed with the patient.                            All questions were answered, and  informed consent                            was obtained. Prior Anticoagulants: The patient has                            taken no previous anticoagulant or antiplatelet                            agents. ASA Grade Assessment: II - A patient with                            mild systemic disease. After reviewing the risks                            and benefits, the patient was deemed in                            satisfactory condition to undergo the procedure.                            After obtaining informed consent, the colonoscope                            was passed under direct vision. Throughout the                            procedure, the patient's blood pressure, pulse, and                            oxygen saturations were monitored continuously. The                            CF [id number] #[id number] was introduced through the anus                            and advanced to the the terminal ileum, with                            identification of the appendiceal orifice and IC                            valve. The colonoscopy was performed with                            difficulty due to a redundant colon and significant                            looping. Successful completion of the procedure was                            aided by an abdominal binder,straightening and  shortening the scope to obtain bowel loop reduction                            and applying abdominal pressure. The quality of the                            bowel preparation was good. The terminal ileum,                            ileocecal valve, appendiceal orifice, and rectum                            were photographed. Scope In: 11:37:23 AM Scope Out: 11:59:56 AM Scope Withdrawal Time: 0 hours 15 minutes 10 seconds  Total Procedure Duration: 0 hours 22 minutes 33 seconds  Findings:                 The perianal and digital rectal examinations were                            normal.                           The terminal ileum appeared normal.                           A 6-8 mm polyp was found in the cecum. The polyp                            was flat. The polyp was removed with a cold snare.                            Resection and retrieval were complete.                           Repeat examination of right colon under NBI                            performed.                           Normal mucosa was found in the entire colon.                             Biopsies for histology were taken with a cold                            forceps from the right colon and left colon for                            evaluation of microscopic colitis.                           The sigmoid colon was significantly redundant.  Internal hemorrhoids were found.                           The exam was otherwise without abnormality on                            direct and retroflexion views. Complications:            No immediate complications. Estimated Blood Loss:     Estimated blood loss was minimal. Impression:               - The examined portion of the ileum was normal.                           - One 6-8 mm polyp in the cecum, removed with a                            cold snare. Resected and retrieved.                           - Normal mucosa in the entire examined colon.                            Biopsied.                           - Redundant colon.                           - Internal hemorrhoids.                           - The examination was otherwise normal on direct                            and retroflexion views. Recommendation:           - Patient has a contact number available for                            emergencies. The signs and symptoms of potential                            delayed complications were discussed with the                            patient. Return to normal activities tomorrow.                            Written discharge instructions were provided to the                            patient.                           - Resume previous diet.                           -  Continue present medications.                           - Await pathology results.                           - Repeat colonoscopy is recommended for                            surveillance. The colonoscopy date will be                            determined after pathology results from today's                            exam  become available for review.                           - Return to my office at appointment to be                            scheduled.                           - Prescription will be sent for Lomotil Darreon Lutes L. Loletha Carrow, MD 12/04/2021 12:07:06 PM This report has been signed electronically.

## 2021-12-07 ENCOUNTER — Ambulatory Visit (HOSPITAL_BASED_OUTPATIENT_CLINIC_OR_DEPARTMENT_OTHER): Payer: 59 | Admitting: Orthopaedic Surgery

## 2021-12-07 ENCOUNTER — Telehealth: Payer: Self-pay | Admitting: *Deleted

## 2021-12-07 NOTE — Telephone Encounter (Signed)
Attempt for follow up phone call. No answer at number given.  Left message on voicemail.   

## 2021-12-08 ENCOUNTER — Telehealth: Payer: Self-pay

## 2021-12-08 NOTE — Telephone Encounter (Signed)
Per 12/04/21 procedure report: - Return to my office at appt to be scheduled  Patient has been scheduled for a follow up appt with Dr. Loletha Carrow on Monday, 01/18/22 at 3:20 pm. I called and left pt a detailed vm with her appt information. I advised pt to call back at her earliest convenience to reschedule if this appt does not work for her.   Appt information mailed to patient.

## 2021-12-09 ENCOUNTER — Ambulatory Visit (INDEPENDENT_AMBULATORY_CARE_PROVIDER_SITE_OTHER): Payer: 59 | Admitting: Orthopaedic Surgery

## 2021-12-09 DIAGNOSIS — M25562 Pain in left knee: Secondary | ICD-10-CM

## 2021-12-09 DIAGNOSIS — M76892 Other specified enthesopathies of left lower limb, excluding foot: Secondary | ICD-10-CM

## 2021-12-09 DIAGNOSIS — M76899 Other specified enthesopathies of unspecified lower limb, excluding foot: Secondary | ICD-10-CM

## 2021-12-09 MED ORDER — TRIAMCINOLONE ACETONIDE 40 MG/ML IJ SUSP
80.0000 mg | INTRAMUSCULAR | Status: AC | PRN
  Administered 2021-12-09: 80 mg via INTRA_ARTICULAR

## 2021-12-09 MED ORDER — LIDOCAINE HCL 1 % IJ SOLN
4.0000 mL | INTRAMUSCULAR | Status: AC | PRN
  Administered 2021-12-09: 4 mL

## 2021-12-09 NOTE — Progress Notes (Signed)
Chief Complaint: Left knee pain,     History of Present Illness:   12/09/2021: For follow-up of her left knee.  Overall she did get good relief about the lateral aspect of the knee following her previous injection.  She is here today for MRI review and discussion of left knee.  At today's visit she is stating she has predominantly medial based pain near the pes tendon   Ana Rose is a 46 y.o. female presents with 1 year of left knee pain which has been ongoing now and worsening over the last several months.  She does have a history of right knee patellofemoral type pain which was previously treated at [facility].  She is here today for the left knee.  She states that she is tender about the medial aspect of the left knee.  She has not had any injections or physical therapy for this side.  She enjoys doing gardening in her spare time.  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  She is here today with her wife.  She does have pain going up and down stairs and with bending of the knee.  She has pain in the knee with longer drives    Surgical History:   None  PMH/PSH/Family History/Social History/Meds/Allergies:    Past Medical History:  Diagnosis Date  . ADD (attention deficit disorder)   . Alcoholism ([place])   . Anemia   . Anxiety   . Bipolar disorder ([place])   . Blood transfusion without reported diagnosis   . CIN I (cervical intraepithelial neoplasia I)   . Congestive heart failure (CHF) ([place])   . Depression   . Diabetes ([place])   . Epileptic seizures ([place])    Last seizure at 46 years of age  . GERD (gastroesophageal reflux disease)   . Heart murmur    funtional  . Hiatal hernia   . History of dysfunctional uterine bleeding   . Hypercholesteremia 08/31/2016   Noted in note  . Hypothyroidism   . Irritable bowel syndrome (IBS)   . Obesity   . PONV (postoperative nausea and vomiting)   . Suicide attempt  ([place]) 08/24/2012   Past Surgical History:  Procedure Laterality Date  . CHOLECYSTECTOMY  2001  . COLONOSCOPY  05/27/2017  . CYST EXCISION Right 10/24/2018   Procedure: Excision of right inner thigh sebaceous cyst;  Surgeon: Wallace Going, DO;  Location: [facility];  Service: Plastics;  Laterality: Right;  . ESOPHAGOGASTRODUODENOSCOPY  05/2012  . GASTRIC BYPASS  2010  . GYNECOLOGIC CRYOSURGERY  Approximately age 73  . LAPAROSCOPIC TOTAL HYSTERECTOMY  12/07/2010   endometriosis on fallopian tubes  . LAPAROSCOPIC UNILATERAL SALPINGECTOMY Right 10/30/2018   Procedure: LAPAROSCOPIC UNILATERAL SALPINGECTOMY;  Surgeon: [name], MD;  Location: [facility];  Service: Gynecology;  Laterality: Right;  . LAPAROSCOPIC UNILATERAL SALPINGO OOPHERECTOMY Left 10/30/2018   Procedure: LAPAROSCOPIC UNILATERAL SALPINGO OOPHORECTOMY;  Surgeon: [name], MD;  Location: [facility];  Service: Gynecology;  Laterality: Left;  request 7:30am OR time in [facility] block requests one hour  . TUBAL LIGATION  2001  . VAGINAL HYSTERECTOMY    . WISDOM TOOTH EXTRACTION     Social History   Socioeconomic History  . Marital status: Married  Spouse name: Not on file  . Number of children: 1  . Years of education: Not on file  . Highest education level: Not on file  Occupational History  . Occupation: Medical illustrator  Tobacco Use  . Smoking status: Former    Types: Cigarettes    Quit date: 11/30/1998    Years since quitting: 23.0  . Smokeless tobacco: Never  Vaping Use  . Vaping Use: Some days  Substance and Sexual Activity  . Alcohol use: Not Currently    Alcohol/week: 0.0 standard drinks of alcohol  . Drug use: No  . Sexual activity: Yes    Partners: Female    Birth control/protection: Surgical    Comment: HYST.-1st intercourse 46 yo-Fewer than 5 partners  Other Topics Concern  . Not on file  Social History Narrative  . Not  on file   Social Determinants of Health   Financial Resource Strain: Not on file  Food Insecurity: Not on file  Transportation Needs: Not on file  Physical Activity: Not on file  Stress: Not on file  Social Connections: Not on file   Family History  Problem Relation Age of Onset  . Hypertension Mother   . Colon polyps Mother   . Irritable bowel syndrome Mother   . Hypertension Father   . Colon cancer Maternal Grandmother   . Crohn's disease Maternal Grandfather   . Diabetes Paternal Grandmother   . Cancer Paternal Grandmother        OV/UT  . Breast cancer Paternal Grandmother 35  . Alcoholism Paternal Grandfather   . Breast cancer Cousin 34       MATERNAL  . Stomach cancer Neg Hx   . Esophageal cancer Neg Hx   . Rectal cancer Neg Hx    Allergies  Allergen Reactions  . Hydrocodone Nausea And Vomiting    vicodin  . Lactose Intolerance (Gi) Nausea And Vomiting  . Nsaids Other (See Comments)    Oral NSAIDS contraindicated due to Gastric Bypass  . Xanax [Alprazolam] Other (See Comments)    Over sedation   Current Outpatient Medications  Medication Sig Dispense Refill  . acamprosate (CAMPRAL) 333 MG tablet Take 2 tablets (666 mg total) by mouth 3 (three) times daily. 180 tablet 0  . acetaminophen (TYLENOL) 325 MG tablet Tylenol  prn (Patient not taking: Reported on 12/04/2021)    . cyanocobalamin (,VITAMIN B-12,) 1000 MCG/ML injection  (Patient not taking: Reported on 12/04/2021)    . diphenoxylate-atropine (LOMOTIL) 2.5-0.025 MG tablet Take 1 tablet by mouth 4 (four) times daily as needed for diarrhea or loose stools. 60 tablet 1  . estradiol (ESTRACE) 1 MG tablet Take 1 tablet (1 mg total) by mouth daily. 90 tablet 2  . folic acid (FOLVITE) 1 MG tablet Take 1 mg by mouth daily.    Marland Kitchen guanFACINE (INTUNIV) 4 MG TB24 ER tablet Take 4 mg by mouth every morning.    . hydrOXYzine (VISTARIL) 50 MG capsule Take by mouth.    . lamoTRIgine (LAMICTAL) 100 MG tablet Take 100 mg by  mouth daily.    . melatonin 3 MG TABS tablet 1 tablet at bedtime as needed with food (Patient not taking: Reported on 12/04/2021)    . Multiple Vitamins-Minerals (ONCOVITE) TABS Take 1 tablet by mouth daily.    . ondansetron (ZOFRAN) 4 MG tablet Take 4 mg by mouth daily.    . pantoprazole (PROTONIX) 40 MG tablet Take 1 tablet (40 mg total) by mouth at bedtime. (Patient not taking:  Reported on 11/03/2021) 30 tablet 0  . rifaximin (XIFAXAN) 550 MG TABS tablet Take 1 tablet (550 mg total) by mouth 2 (two) times daily for 14 days. 28 tablet 0  . sertraline (ZOLOFT) 100 MG tablet Take 150 mg by mouth daily.    . Thiamine HCl (B-1 PO) Take by mouth. (Patient not taking: Reported on 11/03/2021)    . topiramate (TOPAMAX) 50 MG tablet Take 100 mg by mouth at bedtime.    . traMADol (ULTRAM) 50 MG tablet Take 1 tablet (50 mg total) by mouth every 12 (twelve) hours as needed. (Patient not taking: Reported on 12/04/2021) 30 tablet 0  . traZODone (DESYREL) 50 MG tablet Take 50 mg by mouth at bedtime.    . Vitamin D, Ergocalciferol, (DRISDOL) 50000 units CAPS capsule Take 50,000 Units by mouth every 7 (seven) days.     Current Facility-Administered Medications  Medication Dose Route Frequency Provider Last Rate Last Admin  . 0.9 %  sodium chloride infusion  500 mL Intravenous Once Doran Stabler, MD       No results found.  Review of Systems:   A ROS was performed including pertinent positives and negatives as documented in the HPI.  Physical Exam :   Constitutional: NAD and appears stated age Neurological: Alert and oriented Psych: Appropriate affect and cooperative Last menstrual period 11/18/2010.   Comprehensive Musculoskeletal Exam:      Musculoskeletal Exam  Gait Normal  Alignment Normal   Right Left  Inspection Normal Normal  Palpation    Tenderness None Medial joint  Crepitus None None  Effusion None None  Range of Motion    Extension 0 0  Flexion 135 135  Strength    Extension  5/5 5/5  Flexion 5/5 5/5  Ligament Exam     Generalized Laxity No No  Lachman Negative Negative   Pivot Shift Negative Negative  Anterior Drawer Negative Negative  Valgus at 0 Negative Negative  Valgus at 20 Negative Negative  Varus at 0 0 0  Varus at 20   0 0  Posterior Drawer at 90 0 0  Vascular/Lymphatic Exam    Edema None None  Venous Stasis Changes No No  Distal Circulation Normal Normal  Neurologic    Light Touch Sensation Intact Intact  Special Tests: Tenderness about the medial pes tendon     Imaging:   Xray (4 views left knee): Normal  MRI left knee: Within normal limits  I personally reviewed and interpreted the radiographs.   Assessment:   46 y.o. female with left knee pain for approximately a year.  At today's visit she has medial based pain consistent with pes tendinitis.  Effect I recommended ultrasound-guided injection of the left knee pes tendon. Plan :    -Plan for left knee past ultrasound-guided injection after verbal consent obtained -She will begin physical therapy for medial hamstring mobilization and possible dry needling    Procedure Note  Patient: Ana Rose             Date of Birth: 1975/09/26           MRN: [id number]             Visit Date: 12/09/2021  Procedures: Visit Diagnoses:  1. Hamstring tendinitis     Large Joint Inj on 12/09/2021 4:52 PM Indications: pain Details: 22 G 1.5 in needle, ultrasound-guided anterior approach  Arthrogram: No  Medications: 4 mL lidocaine 1 %; 80 mg triamcinolone acetonide 40 MG/ML Outcome: tolerated  well, no immediate complications Procedure, treatment alternatives, risks and benefits explained, specific risks discussed. Consent was given by the patient. Immediately prior to procedure a time out was called to verify the correct patient, procedure, equipment, support staff and site/side marked as required. Patient was prepped and draped in the usual sterile fashion.            I  personally saw and evaluated the patient, and participated in the management and treatment plan.  Vanetta Mulders, MD Attending Physician, Orthopedic Surgery  This document was dictated using Dragon voice recognition software. A reasonable attempt at proof reading has been made to minimize errors.

## 2021-12-09 NOTE — Progress Notes (Signed)
p 

## 2021-12-15 ENCOUNTER — Encounter: Payer: Self-pay | Admitting: Gastroenterology

## 2022-01-18 ENCOUNTER — Encounter: Payer: Self-pay | Admitting: Hematology and Oncology

## 2022-01-18 ENCOUNTER — Encounter: Payer: Self-pay | Admitting: Gastroenterology

## 2022-01-18 ENCOUNTER — Other Ambulatory Visit: Payer: Self-pay

## 2022-01-18 ENCOUNTER — Ambulatory Visit (INDEPENDENT_AMBULATORY_CARE_PROVIDER_SITE_OTHER): Payer: 59 | Admitting: Gastroenterology

## 2022-01-18 ENCOUNTER — Ambulatory Visit (HOSPITAL_BASED_OUTPATIENT_CLINIC_OR_DEPARTMENT_OTHER): Payer: 59 | Attending: Orthopaedic Surgery | Admitting: Physical Therapy

## 2022-01-18 ENCOUNTER — Encounter (HOSPITAL_BASED_OUTPATIENT_CLINIC_OR_DEPARTMENT_OTHER): Payer: Self-pay | Admitting: Physical Therapy

## 2022-01-18 VITALS — BP 90/68 | HR 89 | Ht 66.0 in | Wt 180.0 lb

## 2022-01-18 DIAGNOSIS — R194 Change in bowel habit: Secondary | ICD-10-CM

## 2022-01-18 DIAGNOSIS — M25562 Pain in left knee: Secondary | ICD-10-CM | POA: Insufficient documentation

## 2022-01-18 DIAGNOSIS — M76899 Other specified enthesopathies of unspecified lower limb, excluding foot: Secondary | ICD-10-CM | POA: Insufficient documentation

## 2022-01-18 DIAGNOSIS — G8929 Other chronic pain: Secondary | ICD-10-CM | POA: Insufficient documentation

## 2022-01-18 DIAGNOSIS — R143 Flatulence: Secondary | ICD-10-CM | POA: Diagnosis not present

## 2022-01-18 DIAGNOSIS — M6281 Muscle weakness (generalized): Secondary | ICD-10-CM | POA: Insufficient documentation

## 2022-01-18 DIAGNOSIS — R1084 Generalized abdominal pain: Secondary | ICD-10-CM | POA: Diagnosis not present

## 2022-01-18 DIAGNOSIS — R140 Abdominal distension (gaseous): Secondary | ICD-10-CM | POA: Diagnosis not present

## 2022-01-18 NOTE — Therapy (Signed)
OUTPATIENT PHYSICAL THERAPY LOWER EXTREMITY EVALUATION   Patient Name: Ana Rose MRN: [id number] DOB:1976/02/28, 46 y.o., female Today's Date: 01/18/2022   PT End of Session - 01/18/22 1147     Visit Number 1    Number of Visits 13    Date for PT Re-Evaluation 03/01/22    Authorization Type [employer]    PT Start Time 6578    PT Stop Time 1225    PT Time Calculation (min) 40 min    Activity Tolerance Patient tolerated treatment well    Behavior During Therapy WFL for tasks assessed/performed             Past Medical History:  Diagnosis Date   ADD (attention deficit disorder)    Alcoholism ([place])    Anemia    Anxiety    Bipolar disorder ([place])    Blood transfusion without reported diagnosis    CIN I (cervical intraepithelial neoplasia I)    Congestive heart failure (CHF) ([facility])    Depression    Diabetes ([place])    Epileptic seizures ([place])    Last seizure at 46 years of age   GERD (gastroesophageal reflux disease)    Heart murmur    funtional   Hiatal hernia    History of dysfunctional uterine bleeding    Hypercholesteremia 08/31/2016   Noted in note   Hypothyroidism    Irritable bowel syndrome (IBS)    Obesity    PONV (postoperative nausea and vomiting)    Suicide attempt ([place]) 08/24/2012   Past Surgical History:  Procedure Laterality Date   CHOLECYSTECTOMY  2001   COLONOSCOPY  05/27/2017   CYST EXCISION Right 10/24/2018   Procedure: Excision of right inner thigh sebaceous cyst;  Surgeon: Wallace Going, DO;  Location: [facility];  Service: Plastics;  Laterality: Right;   ESOPHAGOGASTRODUODENOSCOPY  05/2012   GASTRIC BYPASS  2010   GYNECOLOGIC CRYOSURGERY  Approximately age 44   LAPAROSCOPIC TOTAL HYSTERECTOMY  12/07/2010   endometriosis on fallopian tubes   LAPAROSCOPIC UNILATERAL SALPINGECTOMY Right 10/30/2018   Procedure: LAPAROSCOPIC UNILATERAL SALPINGECTOMY;  Surgeon: [name], MD;  Location: [facility];   Service: Gynecology;  Laterality: Right;   LAPAROSCOPIC UNILATERAL SALPINGO OOPHERECTOMY Left 10/30/2018   Procedure: LAPAROSCOPIC UNILATERAL SALPINGO OOPHORECTOMY;  Surgeon: [name], MD;  Location: [facility];  Service: Gynecology;  Laterality: Left;  request 7:30am OR time in [place] block requests one hour   TUBAL LIGATION  2001   VAGINAL HYSTERECTOMY     WISDOM TOOTH EXTRACTION     Patient Active Problem List   Diagnosis Date Noted   Normocytic anemia 09/24/2020   Nasal trauma, initial encounter 09/13/2018   Sebaceous cyst 08/01/2018   Bipolar disorder, unspecified ([place]) 08/25/2012   Alcohol dependence ([place]) 08/25/2012   Epileptic seizures ([place])    ADD (attention deficit disorder)      REFERRING PROVIDER: Vanetta Mulders, MD  REFERRING DIAG: M76.899 (ICD-10-CM) - Hamstring tendinitis  THERAPY DIAG:  Chronic pain of left knee  Muscle weakness (generalized)  Rationale for Evaluation and Treatment Rehabilitation  ONSET DATE: rt knee about 3 yr ago, lt knee about 1 year  SUBJECTIVE:   SUBJECTIVE STATEMENT: I really just have a lot of pain in knees- rubbing lateral knee. I feel like I have to pull up on something to get up from the floor. Hard time getting out of low chair, I feel so old. Occasional painful pops. Knee feels like it gives out,  usually on left side. Unable to stand on one leg to shave legs. Used to do yoga regularly but now my balance is so bad. Stopped yoga with COVID. Daily walks with dogs.   PERTINENT HISTORY: Hiatal hernia & hysterectomy leading to abdominal weakness  PAIN:  Are you having pain? Yes: NPRS scale: 0/10 Pain location: bil knees, left is worse Pain description: tight, swollen Aggravating factors: morning pain, getting up from floor or low surface Relieving factors: ice  PRECAUTIONS: None  WEIGHT BEARING RESTRICTIONS No  FALLS:  Has patient fallen in last 6 months? Yes. 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  Mild osteoarthritis of the medial tibiofemoral compartment.   2.  Small joint effusion.   3. Degenerative changes of the posterior horn of the medial meniscus without discrete tear.   4. Cruciate and collateral ligaments are intact. Quadriceps tendon and patellar tendon are maintained.  Knee xray 10/05/21: No recent fracture or dislocation is seen in the left knee. There is no significant effusion. No significant degenerative changes are noted.   There is 3 mm smooth marginated calcification adjacent to the lateral margin of distal left femur which may suggest soft tissue calcification from previous injury.  PATIENT SURVEYS:  FOTO 63  COGNITION:  Overall cognitive status: Within functional limits for tasks assessed     SENSATION: WFL  EDEMA:  Pt reports feeling swollen but none visible  PALPATION: Soft end feel from muscle and adipose at end range knee flexion, painful per pt  LOWER EXTREMITY MMT:  MMT (lb) Right eval Left eval  Hip flexion    Hip extension    Hip abduction 48.3 36.2  Hip adduction    Hip internal rotation    Hip external rotation    Knee flexion 40.4 25 p!  Knee extension 52.8 42.1  Ankle dorsiflexion    Ankle plantarflexion    Ankle inversion    Ankle eversion     (Blank rows = not tested)    FUNCTIONAL TESTS:  5 times sit to stand: 27s  GAIT: Wide BOS, does not maintain straight line pattern, Rt LE turnout    TODAY'S TREATMENT: EVAL: Self correction- Lt hip flexors/Rt extensors Hooklying iso add with ab set Bridge with ball squeeze   PATIENT EDUCATION:  Education details: Anatomy of condition, POC, HEP, exercise form/rationale Person educated:  Patient Education method: Explanation, Demonstration, Tactile cues, Verbal cues, and Handouts Education comprehension: verbalized understanding, returned demonstration, verbal cues required, tactile cues required, and needs further education   HOME EXERCISE PROGRAM: [id number] Avoid crossing legs  ASSESSMENT:  CLINICAL IMPRESSION: Patient is a 46 y.o. F who was seen today for physical therapy evaluation and treatment for chronic knee pain bilaterally and .    OBJECTIVE IMPAIRMENTS Abnormal gait, decreased activity tolerance, decreased balance, difficulty walking, decreased strength, increased muscle spasms, improper body mechanics, and pain.   ACTIVITY LIMITATIONS bending, sitting, standing, squatting, stairs, and locomotion level  PARTICIPATION LIMITATIONS: cleaning, shopping, and community activity  PERSONAL FACTORS Fitness and Past/current experiences are also affecting patient's functional outcome.   REHAB POTENTIAL: Good  CLINICAL DECISION MAKING: Stable/uncomplicated  EVALUATION COMPLEXITY: Low   GOALS: Goals reviewed with patient? Yes  SHORT TERM GOALS: Target date: 02/08/2022  Verbalized  reduced leg crossing in seated Baseline: Goal status: INITIAL  2.  No longer require self correction Baseline:  Goal status: INITIAL    LONG TERM GOALS: Target date: 03/01/2022   Meet FOTO goal Baseline:  Goal status: INITIAL  2.  Knee flexion/ext strength 90% of opp LE Baseline:  Goal status: INITIAL  3.  Able to get up/down from the floor with minimal discomfort Baseline:  Goal status: INITIAL  4.  5 times sit to stand 12s or less Baseline:  Goal status: INITIAL    PLAN: PT FREQUENCY: 2x/week  PT DURATION: 6 weeks  PLANNED INTERVENTIONS: Therapeutic exercises, Therapeutic activity, Neuromuscular re-education, Balance training, Gait training, Patient/Family education, Self Care, Joint mobilization, Stair training, Aquatic Therapy, Dry Needling, Electrical  stimulation, Cryotherapy, Moist heat, Taping, Ionotophoresis '4mg'$ /ml Dexamethasone, Manual therapy, and Re-evaluation  PLAN FOR NEXT SESSION: recheck pelvic alignment & cont lumbopelvic stability, roller/IASTM to quads/HS, check for need of ionto on pes anserine  Reonna Finlayson C. Nixon Kolton PT, DPT 01/18/22 12:52 PM

## 2022-01-18 NOTE — Patient Instructions (Signed)
_______________________________________________________  If you are age 47 or older, your body mass index should be between 23-30. Your Body mass index is 29.05 kg/m. If this is out of the aforementioned range listed, please consider follow up with your Primary Care Provider.  If you are age 64 or younger, your body mass index should be between 19-25. Your Body mass index is 29.05 kg/m. If this is out of the aformentioned range listed, please consider follow up with your Primary Care Provider.   ________________________________________________________  The [facility] providers would like to encourage you to use MYCHART to communicate with providers for non-urgent requests or questions.  Due to long hold times on the telephone, sending your provider a message by MYCHART may be a faster and more efficient way to get a response.  Please allow 48 business hours for a response.  Please remember that this is for non-urgent requests.  _______________________________________________________  You have been scheduled for a CT scan of the abdomen and pelvis at [facility], 1st floor Radiology. You are scheduled on 01-27-2022 at 1pm. You should arrive 15 minutes prior to your appointment time for registration.  We are giving you 2 bottles of contrast today that you will need to drink before arriving for the exam. The solution may taste better if refrigerated so put them in the refrigerator when you get home, but do NOT add ice or any other liquid to this solution as that would dilute it. Shake well before drinking.   Please follow the written instructions below on the day of your exam:   1) Do not eat anything after 9am (4 hours prior to your test)   2) Drink 1 bottle of contrast @ 11am (2 hours prior to your exam)  Remember to shake well before drinking and do NOT pour over ice.     Drink 1 bottle of contrast @ 12/noon (1 hour prior to your exam)   You may take any medications as prescribed with a  small amount of water, if necessary. If you take any of the following medications: METFORMIN, GLUCOPHAGE, GLUCOVANCE, AVANDAMET, RIOMET, FORTAMET, ACTOPLUS MET, JANUMET, GLUMETZA or METAGLIP, you MAY be asked to HOLD this medication 48 hours AFTER the exam.  The purpose of you drinking the oral contrast is to aid in the visualization of your intestinal tract. The contrast solution may cause some diarrhea. Depending on your individual set of symptoms, you may also receive an intravenous injection of x-ray contrast/dye. Plan on being at [facility] for 45 minutes or longer, depending on the type of exam you are having performed.   If you have any questions regarding your exam or if you need to reschedule, you may call [facility] at [phone] between the hours of 8:00 am and 5:00 pm, Monday-Friday.    We have given you samples of the following medication to take:   Start with Linzess 72mcg and increase to 145mg if not happy with the results.   It was a pleasure to see you today!  Thank you for trusting me with your gastrointestinal care!     

## 2022-01-18 NOTE — Progress Notes (Signed)
[facility] Progress Note  Chief Complaint: Chronic diarrhea  Subjective  History: Ana Rose follows up for her chronic diarrhea, clinical details in my initial office consult note.  Update from a colonoscopy report excerpt: "Chronic diarrhea clinical details in recent GI office consult note. subsequent negative stool testing for O/P and C diff, normal fecal elastase no improvement with trial of metronidazole (empiric SIBO Rx, h/o GBP) currently in midst of rifaximin treatment course Last colonoscopy at [facility] Jan 2019 without polyps"  Patient reported the rifaximin treatment course did not improve her diarrhea and nocturnal incontinence.  12/04/2021 colonoscopy findings: The perianal and digital rectal examinations were normal. - The terminal ileum appeared normal. - A 6-8 mm polyp was found in the cecum. The polyp was flat. The polyp was removed with a cold snare. Resection and retrieval were complete. - Repeat examination of right colon under NBI performed. - Normal mucosa was found in the entire colon. Biopsies for histology were taken with a cold forceps from the right colon and left colon for evaluation of microscopic colitis. - The sigmoid colon was significantly redundant. -Internal hemorrhoids  Pathology results as noted below, after which Lomotil was prescribed.  _______________________________ Ana Rose continues to struggle with her digestive issues. She has fairly generalized bandlike lower abdominal pain, more so when she has difficulty moving her bowels.  She also has bloating and excess gas.  She has a burning rectal pain when having a bowel movement. On further questioning, it sounds like she has an irregular bowel pattern with no BM for 1 to 3 days, then she will sit for prolonged periods until she ultimately has soft or mud like stool many times, and it may continue like that for couple of days.  This will sometimes lead to incontinence while sleeping or  first thing in the morning.  She denies rectal bleeding.   ROS: Cardiovascular:  no chest pain Respiratory: no dyspnea Depression and anxiety Remainder systems negative except as above The patient's Past Medical, Family and Social History were reviewed and are on file in the EMR.  Objective:  Med list reviewed  Current Outpatient Medications:    acetaminophen (TYLENOL) 325 MG tablet, as needed., Disp: , Rfl:    cyanocobalamin (,VITAMIN B-12,) 1000 MCG/ML injection, every 30 (thirty) days., Disp: , Rfl:    estradiol (ESTRACE) 1 MG tablet, Take 1 tablet (1 mg total) by mouth daily., Disp: 90 tablet, Rfl: 2   folic acid (FOLVITE) 1 MG tablet, Take 1 mg by mouth daily., Disp: , Rfl:    guanFACINE (INTUNIV) 4 MG TB24 ER tablet, Take 4 mg by mouth every morning., Disp: , Rfl:    hydrOXYzine (VISTARIL) 50 MG capsule, Take by mouth., Disp: , Rfl:    Multiple Vitamins-Minerals (ONCOVITE) TABS, Take 1 tablet by mouth daily., Disp: , Rfl:    ondansetron (ZOFRAN) 4 MG tablet, Take 4 mg by mouth daily., Disp: , Rfl:    pantoprazole (PROTONIX) 40 MG tablet, Take 1 tablet (40 mg total) by mouth at bedtime., Disp: 30 tablet, Rfl: 0   sertraline (ZOLOFT) 100 MG tablet, Take 150 mg by mouth daily., Disp: , Rfl:    Thiamine HCl (B-1 PO), Take by mouth., Disp: , Rfl:    topiramate (TOPAMAX) 50 MG tablet, Take 100 mg by mouth at bedtime., Disp: , Rfl:    traZODone (DESYREL) 50 MG tablet, Take 300 mg by mouth at bedtime., Disp: , Rfl:    Vitamin D, Ergocalciferol, (DRISDOL) 50000  units CAPS capsule, Take 50,000 Units by mouth every 7 (seven) days., Disp: , Rfl:    diphenoxylate-atropine (LOMOTIL) 2.5-0.025 MG tablet, Take 1 tablet by mouth 4 (four) times daily as needed for diarrhea or loose stools. (Patient not taking: Reported on 01/18/2022), Disp: 60 tablet, Rfl: 1   lamoTRIgine (LAMICTAL) 100 MG tablet, Take 100 mg by mouth daily. (Patient not taking: Reported on 01/18/2022), Disp: , Rfl:    melatonin 3  MG TABS tablet, 1 tablet at bedtime as needed with food (Patient not taking: Reported on 12/04/2021), Disp: , Rfl:    traMADol (ULTRAM) 50 MG tablet, Take 1 tablet (50 mg total) by mouth every 12 (twelve) hours as needed. (Patient not taking: Reported on 12/04/2021), Disp: 30 tablet, Rfl: 0  Current Facility-Administered Medications:    0.9 %  sodium chloride infusion, 500 mL, Intravenous, Once, Danis, Estill Cotta III, MD   Vital signs in last 24 hrs: Vitals:   01/18/22 1534  BP: 90/68  Pulse: 89   Wt Readings from Last 3 Encounters:  01/18/22 180 lb (81.6 kg)  12/04/21 162 lb (73.5 kg)  11/03/21 162 lb (73.5 kg)    Physical Exam  Well-appearing, normal muscle mass HEENT: sclera anicteric, oral mucosa moist without lesions Neck: supple, no thyromegaly, JVD or lymphadenopathy Cardiac: Regular without murmur,  no peripheral edema Pulm: clear to auscultation bilaterally, normal RR and effort noted Abdomen: soft, generalized tenderness, with active bowel sounds. No guarding or palpable hepatosplenomegaly. Skin; warm and dry, no jaundice or rash  Labs:   ___________________________________________ Radiologic studies:   ____________________________________________ Other: Colonoscopy pathology results:  1. Surgical [P], colon, cecum, polyp (1) - SESSILE SERRATED POLYP WITHOUT DYSPLASIA 2. Surgical [P], random sites colon - COLONIC MUCOSA WITHIN NORMAL LIMITS.  _____________________________________________ Assessment & Plan  Assessment: Encounter Diagnoses  Name Primary?   Generalized abdominal pain Yes   Abdominal bloating    Flatulence    Altered bowel habits    Ana Rose seems to have a chronic motility disorder, possibly related to previous gastric bypass.  It sounds like the underlying problem may be slow motility, then symptoms worsen for a few days until she has evacuation. Less likely would be another anatomic problem such as chronic obstruction/bypass loop dilatation  related to previous GBP.   Plan: CT scan abdomen and pelvis to assess GI anatomy for above concerns  Low-dose Linzess, 72 mcg daily, increasing to 145 mcg daily if needed.  This may be limited by side effect of loose or frequent stool.  It may be difficult to find a medicine to treat this, possibly low-dose Motegrity.   35 minutes were spent on this encounter (including chart review, history/exam, counseling/coordination of care, and documentation) > 50% of that time was spent on counseling and coordination of care.   Nelida Meuse III

## 2022-01-21 ENCOUNTER — Telehealth (HOSPITAL_BASED_OUTPATIENT_CLINIC_OR_DEPARTMENT_OTHER): Payer: Self-pay | Admitting: Physical Therapy

## 2022-01-21 ENCOUNTER — Ambulatory Visit (HOSPITAL_BASED_OUTPATIENT_CLINIC_OR_DEPARTMENT_OTHER): Payer: 59 | Admitting: Physical Therapy

## 2022-01-21 NOTE — Telephone Encounter (Signed)
Called phone number as she did not arrive for 2:30 appt. Pt wife answered and stated she left her phone at home but did leave for her appt on time.   I reached back out to pt wife as she still had not arrived at 3:00 and I cannot find her or her car at the [facility].   Bryttani Blew C. Warrene Kapfer PT, DPT 01/21/22 3:16 PM

## 2022-01-26 ENCOUNTER — Ambulatory Visit (HOSPITAL_BASED_OUTPATIENT_CLINIC_OR_DEPARTMENT_OTHER): Payer: 59 | Attending: Orthopaedic Surgery | Admitting: Physical Therapy

## 2022-01-26 ENCOUNTER — Encounter (HOSPITAL_BASED_OUTPATIENT_CLINIC_OR_DEPARTMENT_OTHER): Payer: Self-pay | Admitting: Physical Therapy

## 2022-01-26 DIAGNOSIS — M25562 Pain in left knee: Secondary | ICD-10-CM | POA: Diagnosis present

## 2022-01-26 DIAGNOSIS — M6281 Muscle weakness (generalized): Secondary | ICD-10-CM | POA: Insufficient documentation

## 2022-01-26 DIAGNOSIS — G8929 Other chronic pain: Secondary | ICD-10-CM | POA: Diagnosis present

## 2022-01-26 NOTE — Therapy (Signed)
OUTPATIENT PHYSICAL THERAPY LOWER EXTREMITY TREATMENT   Patient Name: Ana Rose MRN: [id number] DOB:Oct 06, 1975, 46 y.o., female Today's Date: 01/26/2022   PT End of Session - 01/26/22 1320     Visit Number 2    Number of Visits 13    Date for PT Re-Evaluation 03/01/22    Authorization Type Aet[employer]    PT Start Time 1318    PT Stop Time 1400    PT Time Calculation (min) 42 min    Activity Tolerance Patient tolerated treatment well    Behavior During Therapy WFL for tasks assessed/performed              Past Medical History:  Diagnosis Date   ADD (attention deficit disorder)    Alcoholism ([place])    Anemia    Anxiety    Bipolar disorder ([place])    Blood transfusion without reported diagnosis    CIN I (cervical intraepithelial neoplasia I)    Congestive heart failure (CHF) (HCC)    Depression    Diabetes ([place])    Epileptic seizures ([place])    Last seizure at 46 years of age   GERD (gastroesophageal reflux disease)    Heart murmur    funtional   Hiatal hernia    History of dysfunctional uterine bleeding    Hypercholesteremia 08/31/2016   Noted in note   Hypothyroidism    Irritable bowel syndrome (IBS)    Obesity    PONV (postoperative nausea and vomiting)    Suicide attempt ([place]) 08/24/2012   Past Surgical History:  Procedure Laterality Date   CHOLECYSTECTOMY  2001   COLONOSCOPY  05/27/2017   CYST EXCISION Right 10/24/2018   Procedure: Excision of right inner thigh sebaceous cyst;  Surgeon: Wallace Going, DO;  Location: [facility];  Service: Plastics;  Laterality: Right;   ESOPHAGOGASTRODUODENOSCOPY  05/2012   GASTRIC BYPASS  2010   GYNECOLOGIC CRYOSURGERY  Approximately age 30   LAPAROSCOPIC TOTAL HYSTERECTOMY  12/07/2010   endometriosis on fallopian tubes   LAPAROSCOPIC UNILATERAL SALPINGECTOMY Right 10/30/2018   Procedure: LAPAROSCOPIC UNILATERAL SALPINGECTOMY;  Surgeon: [name], MD;  Location: [facility];   Service: Gynecology;  Laterality: Right;   LAPAROSCOPIC UNILATERAL SALPINGO OOPHERECTOMY Left 10/30/2018   Procedure: LAPAROSCOPIC UNILATERAL SALPINGO OOPHORECTOMY;  Surgeon: [name], MD;  Location: [facility];  Service: Gynecology;  Laterality: Left;  request 7:30am OR time in [place] block requests one hour   TUBAL LIGATION  2001   VAGINAL HYSTERECTOMY     WISDOM TOOTH EXTRACTION     Patient Active Problem List   Diagnosis Date Noted   Normocytic anemia 09/24/2020   Nasal trauma, initial encounter 09/13/2018   Sebaceous cyst 08/01/2018   Bipolar disorder, unspecified ([place]) 08/25/2012   Alcohol dependence ([place]) 08/25/2012   Epileptic seizures ([place])    ADD (attention deficit disorder)      REFERRING PROVIDER: Vanetta Mulders, MD  REFERRING DIAG: M76.899 (ICD-10-CM) - Hamstring tendinitis  THERAPY DIAG:  Chronic pain of left knee  Muscle weakness (generalized)  Rationale for Evaluation and Treatment Rehabilitation  ONSET DATE: rt knee about 3 yr ago, lt knee about 1 year  SUBJECTIVE:   SUBJECTIVE STATEMENT: I felt a little odd after doing correction. Did yard work on Sunday and had to ice because the medial Left knee is tight.   PERTINENT HISTORY: Hiatal hernia & hysterectomy leading to abdominal weakness  PAIN:  Are you having pain? Yes: NPRS scale: 0/10 Pain  location: bil knees, left is worse Pain description: tight, swollen Aggravating factors: morning pain, getting up from floor or low surface Relieving factors: ice  PRECAUTIONS: None  WEIGHT BEARING RESTRICTIONS No  FALLS:  Has patient fallen in last 6 months? Yes. 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  Mild  osteoarthritis of the medial tibiofemoral compartment.   2.  Small joint effusion.   3. Degenerative changes of the posterior horn of the medial meniscus without discrete tear.   4. Cruciate and collateral ligaments are intact. Quadriceps tendon and patellar tendon are maintained.  Knee xray 10/05/21: No recent fracture or dislocation is seen in the left knee. There is no significant effusion. No significant degenerative changes are noted.   There is 3 mm smooth marginated calcification adjacent to the lateral margin of distal left femur which may suggest soft tissue calcification from previous injury.  PATIENT SURVEYS:  FOTO 63  EDEMA:  Pt reports feeling swollen but none visible  PALPATION: Soft end feel from muscle and adipose at end range knee flexion, painful per pt  LOWER EXTREMITY MMT:  MMT (lb) Right eval Left eval  Hip flexion    Hip extension    Hip abduction 48.3 36.2  Hip adduction    Hip internal rotation    Hip external rotation    Knee flexion 40.4 25 p!  Knee extension 52.8 42.1  Ankle dorsiflexion    Ankle plantarflexion    Ankle inversion    Ankle eversion     (Blank rows = not tested)    FUNCTIONAL TESTS:  5 times sit to stand: 27s  GAIT: Wide BOS, does not maintain straight line pattern, Rt LE turnout    TODAY'S TREATMENT: MANUAL: roller and trigger point release to hamstrings Seated HSS, seated piriformis stretch Sidelying clams & long leg circles SLS with mini squat in mirror Wall sit 20s holds- 30s holds started Lt knee pain IONTO: dexamethasone, 1cc, 6 hr wear time- Lt pes anserine   PATIENT EDUCATION:  Education details: Geophysicist/field seismologist of condition, POC, HEP, exercise form/rationale Person educated: Patient Education method: Explanation, Demonstration, Tactile cues, Verbal cues, and Handouts Education comprehension: verbalized understanding, returned demonstration, verbal cues required, tactile cues required, and needs further  education   HOME EXERCISE PROGRAM: [id number] Avoid crossing legs  ASSESSMENT:  CLINICAL IMPRESSION: Notable lack of eccentric control in CKC knee extension on right side. Concordant TTP at pes anserine so ionto was placed today.    OBJECTIVE IMPAIRMENTS Abnormal gait, decreased activity tolerance, decreased balance, difficulty walking, decreased strength, increased muscle spasms, improper body mechanics, and pain.   ACTIVITY LIMITATIONS bending, sitting, standing, squatting, stairs, and locomotion level  PARTICIPATION LIMITATIONS: cleaning, shopping, and community activity  PERSONAL FACTORS Fitness and Past/current experiences are also affecting patient's functional outcome.   REHAB POTENTIAL: Good  CLINICAL DECISION MAKING: Stable/uncomplicated  EVALUATION COMPLEXITY: Low   GOALS: Goals reviewed with patient? Yes  SHORT TERM GOALS: Target date: 02/08/2022  Verbalized reduced leg crossing in seated Baseline: Goal status: achieved  2.  No longer require self correction Baseline:  Goal status: achieved    LONG TERM GOALS: Target date: 03/01/2022   Meet FOTO goal Baseline:  Goal status: INITIAL  2.  Knee flexion/ext strength 90%  of opp LE Baseline:  Goal status: INITIAL  3.  Able to get up/down from the floor with minimal discomfort Baseline:  Goal status: INITIAL  4.  5 times sit to stand 12s or less Baseline:  Goal status: INITIAL    PLAN: PT FREQUENCY: 2x/week  PT DURATION: 6 weeks  PLANNED INTERVENTIONS: Therapeutic exercises, Therapeutic activity, Neuromuscular re-education, Balance training, Gait training, Patient/Family education, Self Care, Joint mobilization, Stair training, Aquatic Therapy, Dry Needling, Electrical stimulation, Cryotherapy, Moist heat, Taping, Ionotophoresis '4mg'$ /ml Dexamethasone, Manual therapy, and Re-evaluation  PLAN FOR NEXT SESSION: outcome of ionto? Cont hip strength  Trissa Molina C. Aleia Larocca PT, DPT 01/26/22 2:01 PM

## 2022-01-27 ENCOUNTER — Ambulatory Visit (HOSPITAL_COMMUNITY): Payer: 59

## 2022-01-27 NOTE — Therapy (Signed)
OUTPATIENT PHYSICAL THERAPY LOWER EXTREMITY TREATMENT   Patient Name: Ana Rose MRN: [id number] DOB:08/12/1975, 46 y.o., female Today's Date: 01/28/2022   PT End of Session - 01/28/22 1437     Visit Number 3    Number of Visits 13    Date for PT Re-Evaluation 03/01/22    Authorization Type Aetna    PT Start Time 1435    PT Stop Time 3299    PT Time Calculation (min) 42 min    Activity Tolerance Patient tolerated treatment well    Behavior During Therapy WFL for tasks assessed/performed               Past Medical History:  Diagnosis Date   ADD (attention deficit disorder)    Alcoholism ([place])    Anemia    Anxiety    Bipolar disorder ([place])    Blood transfusion without reported diagnosis    CIN I (cervical intraepithelial neoplasia I)    Congestive heart failure (CHF) (HCC)    Depression    Diabetes ([place])    Epileptic seizures ([place])    Last seizure at 46 years of age   GERD (gastroesophageal reflux disease)    Heart murmur    funtional   Hiatal hernia    History of dysfunctional uterine bleeding    Hypercholesteremia 08/31/2016   Noted in note   Hypothyroidism    Irritable bowel syndrome (IBS)    Obesity    PONV (postoperative nausea and vomiting)    Suicide attempt ([place]) 08/24/2012   Past Surgical History:  Procedure Laterality Date   CHOLECYSTECTOMY  2001   COLONOSCOPY  05/27/2017   CYST EXCISION Right 10/24/2018   Procedure: Excision of right inner thigh sebaceous cyst;  Surgeon: Wallace Going, DO;  Location: [facility];  Service: Plastics;  Laterality: Right;   ESOPHAGOGASTRODUODENOSCOPY  05/2012   GASTRIC BYPASS  2010   GYNECOLOGIC CRYOSURGERY  Approximately age 65   LAPAROSCOPIC TOTAL HYSTERECTOMY  12/07/2010   endometriosis on fallopian tubes   LAPAROSCOPIC UNILATERAL SALPINGECTOMY Right 10/30/2018   Procedure: LAPAROSCOPIC UNILATERAL SALPINGECTOMY;  Surgeon: [name], MD;  Location: [facility];   Service: Gynecology;  Laterality: Right;   LAPAROSCOPIC UNILATERAL SALPINGO OOPHERECTOMY Left 10/30/2018   Procedure: LAPAROSCOPIC UNILATERAL SALPINGO OOPHORECTOMY;  Surgeon: [name], MD;  Location: [facility];  Service: Gynecology;  Laterality: Left;  request 7:30am OR time in [place] block requests one hour   TUBAL LIGATION  2001   VAGINAL HYSTERECTOMY     WISDOM TOOTH EXTRACTION     Patient Active Problem List   Diagnosis Date Noted   Normocytic anemia 09/24/2020   Nasal trauma, initial encounter 09/13/2018   Sebaceous cyst 08/01/2018   Bipolar disorder, unspecified ([place]) 08/25/2012   Alcohol dependence ([place]) 08/25/2012   Epileptic seizures ([place])    ADD (attention deficit disorder)      REFERRING PROVIDER: Vanetta Mulders, MD  REFERRING DIAG: M76.899 (ICD-10-CM) - Hamstring tendinitis  THERAPY DIAG:  Chronic pain of left knee  Muscle weakness (generalized)  Rationale for Evaluation and Treatment Rehabilitation  ONSET DATE: rt knee about 3 yr ago, lt knee about 1 year  SUBJECTIVE:   SUBJECTIVE STATEMENT: Pt states she felt fine after prior Rx.  Pt reports compliance with HEP.  Pt states she is sore with the exercises.  Pt reports the into patch helped.  Pt states she didn't perform the correction the past 2 days because she has felt level.  PERTINENT HISTORY: Hiatal hernia & hysterectomy leading to abdominal weakness  PAIN:  Are you having pain? Yes: NPRS scale: 4/10 Pain location: left medial knee, pes anserine, and HS Pain description: tight, swollen Aggravating factors: morning pain, getting up from floor or low surface Relieving factors: ice  PRECAUTIONS: None  WEIGHT BEARING RESTRICTIONS No  FALLS:  Has patient fallen in last 6 months? Yes. 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  Mild osteoarthritis of the medial tibiofemoral compartment.   2.  Small joint effusion.   3. Degenerative changes of the posterior horn of the medial meniscus without discrete tear.   4. Cruciate and collateral ligaments are intact. Quadriceps tendon and patellar tendon are maintained.  Knee xray 10/05/21: No recent fracture or dislocation is seen in the left knee. There is no significant effusion. No significant degenerative changes are noted.   There is 3 mm smooth marginated calcification adjacent to the lateral margin of distal left femur which may suggest soft tissue calcification from previous injury.    TODAY'S TREATMENT: Therapeutic Exercise: -Reviewed response to prior Rx, pain level, and HEP compliance.   -Posture:  Level IC and ASIS.  -Pt performed: Sidelying clams 2x10 bilat             S/L'ing hip abd circles x10 cw and ccw bilat Supine bridge with ball b/w knees 2x10 reps Prone HS curl with eccentric lowering 2x10 reps Mini Wall sit 3x20s Seated HS stretch 3x20-30 sec Attempted seated piriformis stretch though stopped due to knee pain    Manual Therapy:     Pt received STM and the roller to L HS in prone to improve pain and tightness and reduce myofascial restrictions and adhesions    Modalities:   Pt received Ionto home patch with 4 mg/ml of dexamethasone to L pes anserine to improve pain and inflammation.  PT educated pt with rationale and purpose of Ionto.  PT educated pt with appropriate wear time and appropriate skin response.  PT instructed pt to remove patch if it irritates skin.     PATIENT EDUCATION:  Education details: Anatomy of condition, POC, HEP, exercise form/rationale.  Ionto rationale, mechanism, purpose, and appropriate wear time. Person educated: Patient Education method: Explanation, Demonstration, Tactile cues,  Verbal cues, and Handouts Education comprehension: verbalized understanding, returned demonstration, verbal cues required, tactile cues required, and needs further education   HOME EXERCISE PROGRAM: [id number] Avoid crossing legs  ASSESSMENT:  CLINICAL IMPRESSION: Pt has weakness in L hip > R hip as evidenced by performance of glute exercises.  She performed exercises well with cuing and instruction in correct form.  Attempted seated piriformis stretch though didn't perform due to L knee pain.  Pt had increased L knee pain on 3rd set of wall sit.  Pt was tender with STW and she has soft tissue tightness with trigger points in L HS.  Pt reports improved sx's with ionto last visit and PT used ionto at pes anserine today.  Pt responded well to Rx reporting improved pain from 4/10 before Rx to 2/10 after Rx.  Pt should benefit from skilled PT services to address goals and impairments and to improve overall function.         OBJECTIVE IMPAIRMENTS Abnormal  gait, decreased activity tolerance, decreased balance, difficulty walking, decreased strength, increased muscle spasms, improper body mechanics, and pain.   ACTIVITY LIMITATIONS bending, sitting, standing, squatting, stairs, and locomotion level  PARTICIPATION LIMITATIONS: cleaning, shopping, and community activity  PERSONAL FACTORS Fitness and Past/current experiences are also affecting patient's functional outcome.   REHAB POTENTIAL: Good  CLINICAL DECISION MAKING: Stable/uncomplicated  EVALUATION COMPLEXITY: Low   GOALS: Goals reviewed with patient? Yes  SHORT TERM GOALS: Target date: 02/08/2022  Verbalized reduced leg crossing in seated Baseline: Goal status: achieved  2.  No longer require self correction Baseline:  Goal status: achieved    LONG TERM GOALS: Target date: 03/01/2022   Meet FOTO goal Baseline:  Goal status: INITIAL  2.  Knee flexion/ext strength 90% of opp LE Baseline:  Goal status: INITIAL  3.  Able  to get up/down from the floor with minimal discomfort Baseline:  Goal status: INITIAL  4.  5 times sit to stand 12s or less Baseline:  Goal status: INITIAL    PLAN: PT FREQUENCY: 2x/week  PT DURATION: 6 weeks  PLANNED INTERVENTIONS: Therapeutic exercises, Therapeutic activity, Neuromuscular re-education, Balance training, Gait training, Patient/Family education, Self Care, Joint mobilization, Stair training, Aquatic Therapy, Dry Needling, Electrical stimulation, Cryotherapy, Moist heat, Taping, Ionotophoresis '4mg'$ /ml Dexamethasone, Manual therapy, and Re-evaluation  PLAN FOR NEXT SESSION:  Cont with hip strength and eccentric HS per pt tolerance.  Cont with ionto if pt continues to respond well.  Selinda Michaels III PT, DPT 01/28/22 10:44 PM

## 2022-01-27 NOTE — Addendum Note (Signed)
Addended by: Yevette Edwards on: 01/27/2022 08:49 AM   Modules accepted: Orders

## 2022-01-28 ENCOUNTER — Encounter (HOSPITAL_BASED_OUTPATIENT_CLINIC_OR_DEPARTMENT_OTHER): Payer: Self-pay | Admitting: Physical Therapy

## 2022-01-28 ENCOUNTER — Ambulatory Visit (HOSPITAL_BASED_OUTPATIENT_CLINIC_OR_DEPARTMENT_OTHER): Payer: 59 | Admitting: Physical Therapy

## 2022-01-28 DIAGNOSIS — G8929 Other chronic pain: Secondary | ICD-10-CM

## 2022-01-28 DIAGNOSIS — M6281 Muscle weakness (generalized): Secondary | ICD-10-CM

## 2022-01-28 DIAGNOSIS — M25562 Pain in left knee: Secondary | ICD-10-CM | POA: Diagnosis not present

## 2022-01-29 DIAGNOSIS — R69 Illness, unspecified: Secondary | ICD-10-CM | POA: Diagnosis not present

## 2022-02-02 ENCOUNTER — Ambulatory Visit (HOSPITAL_BASED_OUTPATIENT_CLINIC_OR_DEPARTMENT_OTHER): Payer: 59 | Admitting: Physical Therapy

## 2022-02-02 ENCOUNTER — Encounter (HOSPITAL_BASED_OUTPATIENT_CLINIC_OR_DEPARTMENT_OTHER): Payer: Self-pay | Admitting: Physical Therapy

## 2022-02-02 DIAGNOSIS — G8929 Other chronic pain: Secondary | ICD-10-CM

## 2022-02-02 DIAGNOSIS — M25562 Pain in left knee: Secondary | ICD-10-CM | POA: Diagnosis not present

## 2022-02-02 DIAGNOSIS — M6281 Muscle weakness (generalized): Secondary | ICD-10-CM

## 2022-02-02 NOTE — Therapy (Addendum)
 OUTPATIENT PHYSICAL THERAPY LOWER EXTREMITY TREATMENT   Patient Name: Ana Rose MRN: [id number] DOB:May 02, 1976, 46 y.o., female Today's Date: 02/03/2022   PT End of Session - 02/02/22 1354     Visit Number 4    Number of Visits 13    Date for PT Re-Evaluation 03/01/22    Authorization Type [employer]    PT Start Time 1350    PT Stop Time 1432    PT Time Calculation (min) 42 min    Activity Tolerance Patient tolerated treatment well    Behavior During Therapy WFL for tasks assessed/performed               Past Medical History:  Diagnosis Date   ADD (attention deficit disorder)    Alcoholism (HCC)    Anemia    Anxiety    Bipolar disorder (HCC)    Blood transfusion without reported diagnosis    CIN I (cervical intraepithelial neoplasia I)    Congestive heart failure (CHF) (HCC)    Depression    Diabetes (HCC)    Epileptic seizures (HCC)    Last seizure at 46 years of age   GERD (gastroesophageal reflux disease)    Heart murmur    funtional   Hiatal hernia    History of dysfunctional uterine bleeding    Hypercholesteremia 08/31/2016   Noted in note   Hypothyroidism    Irritable bowel syndrome (IBS)    Obesity    PONV (postoperative nausea and vomiting)    Suicide attempt (HCC) 08/24/2012   Past Surgical History:  Procedure Laterality Date   CHOLECYSTECTOMY  2001   COLONOSCOPY  05/27/2017   CYST EXCISION Right 10/24/2018   Procedure: Excision of right inner thigh sebaceous cyst;  Surgeon: Peggye Form, DO;  Location: [facility];  Service: Plastics;  Laterality: Right;   ESOPHAGOGASTRODUODENOSCOPY  05/2012   GASTRIC BYPASS  2010   GYNECOLOGIC CRYOSURGERY  Approximately age 17   LAPAROSCOPIC TOTAL HYSTERECTOMY  12/07/2010   endometriosis on fallopian tubes   LAPAROSCOPIC UNILATERAL SALPINGECTOMY Right 10/30/2018   Procedure: LAPAROSCOPIC UNILATERAL SALPINGECTOMY;  Surgeon: Dara Lords, MD;  Location: [facility];   Service: Gynecology;  Laterality: Right;   LAPAROSCOPIC UNILATERAL SALPINGO OOPHERECTOMY Left 10/30/2018   Procedure: LAPAROSCOPIC UNILATERAL SALPINGO OOPHORECTOMY;  Surgeon: Dara Lords, MD;  Location: [facility];  Service: Gynecology;  Laterality: Left;  request 7:30am OR time in [place] [facility] block requests one hour   TUBAL LIGATION  2001   VAGINAL HYSTERECTOMY     WISDOM TOOTH EXTRACTION     Patient Active Problem List   Diagnosis Date Noted   Normocytic anemia 09/24/2020   Nasal trauma, initial encounter 09/13/2018   Sebaceous cyst 08/01/2018   Bipolar disorder, unspecified (HCC) 08/25/2012   Alcohol dependence (HCC) 08/25/2012   Epileptic seizures (HCC)    ADD (attention deficit disorder)      REFERRING PROVIDER: Huel Cote, MD  REFERRING DIAG: M76.899 (ICD-10-CM) - Hamstring tendinitis  THERAPY DIAG:  Chronic pain of left knee  Muscle weakness (generalized)  Rationale for Evaluation and Treatment Rehabilitation  ONSET DATE: rt knee about 3 yr ago, lt knee about 1 year  SUBJECTIVE:   SUBJECTIVE STATEMENT: Pt states she did a lot of packing boxes this weekend and had to be up/down a lot which has increased pain.  She also had to ascend/descend stairs a good amount.  Pt states she felt good after prior Rx and reports improved improved sx's with  Ionto patch.  Pt reports compliance with HEP.  Pt states she feels better with the exercises.  Pt states she is improving overall.   PERTINENT HISTORY: Hiatal hernia & hysterectomy leading to abdominal weakness  PAIN:  Are you having pain? Yes: NPRS scale: 5-6/10 Pain location: left medial knee and pes anserine > HS Pain description: tight, swollen.  Can have shooting pains which can be 8/10 but doesn't last long Aggravating factors: morning pain, getting up from floor or low surface Relieving factors: ice  PRECAUTIONS: None  WEIGHT BEARING RESTRICTIONS No  FALLS:  Has patient fallen in  last 6 months? Yes. 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  Mild osteoarthritis of the medial tibiofemoral compartment.   2.  Small joint effusion.   3. Degenerative changes of the posterior horn of the medial meniscus without discrete tear.   4. Cruciate and collateral ligaments are intact. Quadriceps tendon and patellar tendon are maintained.  Knee xray 10/05/21: No recent fracture or dislocation is seen in the left knee. There is no significant effusion. No significant degenerative changes are noted.   There is 3 mm smooth marginated calcification adjacent to the lateral margin of distal left femur which may suggest soft tissue calcification from previous injury.    TODAY'S TREATMENT: Therapeutic Exercise: -Reviewed response to prior Rx, pain level, and HEP compliance.   -Pt performed: Sidelying clams 2x10 bilat             S/L'ing hip abd circles x10 cw and ccw bilat Supine bridge with ball b/w knees 2x10 reps Prone HS curl with eccentric lowering with 1.5# x10 reps and with 2# x 10 reps Seated HS stretch 3x20-30 sec   Manual Therapy:     Pt received STM and the roller to L HS in prone to improve pain and tightness and reduce myofascial restrictions and adhesions    Modalities:   Pt received Ionto home patch with 4 mg/ml of dexamethasone to L pes anserine to improve pain and inflammation.  PT educated pt with appropriate wear time and appropriate skin response.  PT instructed pt to remove patch if it irritates skin.     PATIENT EDUCATION:  Education details: Anatomy of condition, POC, HEP, exercise form/rationale.  Ionto rationale, mechanism, purpose, and appropriate wear time. Person educated: Patient Education  method: Explanation, Demonstration, Tactile cues, Verbal cues, and Handouts Education comprehension: verbalized understanding, returned demonstration, verbal cues required, tactile cues required, and needs further education   HOME EXERCISE PROGRAM: [id number] Avoid crossing legs  ASSESSMENT:  CLINICAL IMPRESSION: Pt reports she is improving and has improved sx's with ionto patch.  Pt has soft tissue tightness in L HS and is more tender in medial HS > lateral HS.  She states she feels better but wobbly after STM.  Pt performed exercises well and demonstrates improved form and ease with exercises.  Added light weight to HS eccentric and pt performed well without c/o's.  Pt responded well to Rx reporting improved pain from 5-6/10 before Rx to 3-4/10 after Rx.  Pt should benefit from skilled PT services to address goals and impairments and to improve overall function.         OBJECTIVE IMPAIRMENTS Abnormal gait, decreased activity tolerance, decreased balance,  difficulty walking, decreased strength, increased muscle spasms, improper body mechanics, and pain.   ACTIVITY LIMITATIONS bending, sitting, standing, squatting, stairs, and locomotion level  PARTICIPATION LIMITATIONS: cleaning, shopping, and community activity  PERSONAL FACTORS Fitness and Past/current experiences are also affecting patient's functional outcome.   REHAB POTENTIAL: Good  CLINICAL DECISION MAKING: Stable/uncomplicated  EVALUATION COMPLEXITY: Low   GOALS: Goals reviewed with patient? Yes  SHORT TERM GOALS: Target date: 02/08/2022  Verbalized reduced leg crossing in seated Baseline: Goal status: achieved  2.  No longer require self correction Baseline:  Goal status: achieved    LONG TERM GOALS: Target date: 03/01/2022   Meet FOTO goal Baseline:  Goal status: INITIAL  2.  Knee flexion/ext strength 90% of opp LE Baseline:  Goal status: INITIAL  3.  Able to get up/down from the floor with minimal  discomfort Baseline:  Goal status: INITIAL  4.  5 times sit to stand 12s or less Baseline:  Goal status: INITIAL    PLAN: PT FREQUENCY: 2x/week  PT DURATION: 6 weeks  PLANNED INTERVENTIONS: Therapeutic exercises, Therapeutic activity, Neuromuscular re-education, Balance training, Gait training, Patient/Family education, Self Care, Joint mobilization, Stair training, Aquatic Therapy, Dry Needling, Electrical stimulation, Cryotherapy, Moist heat, Taping, Ionotophoresis 4mg /ml Dexamethasone, Manual therapy, and Re-evaluation  PLAN FOR NEXT SESSION:  Cont with hip strength and eccentric HS per pt tolerance.  Cont with ionto if pt continues to respond well.  Audie Clear III PT, DPT 02/03/22 8:35 AM  PHYSICAL THERAPY DISCHARGE SUMMARY  Visits from Start of Care: 4  Current functional level related to goals / functional outcomes: Unable to assess current functional status due to pt not being present at discharge.    Remaining deficits: Unable to assess due pt not being present at discharge.     Education / Equipment: See above   Patient was seen in PT from 12/29/2021 to 02/02/2022.  She cancelled her remaining appointments due to illness and also family illness.  Pt did not schedule any further PT and will be considered discharged at this time.   Audie Clear III PT, DPT 08/01/23 3:21 PM

## 2022-02-05 ENCOUNTER — Ambulatory Visit (HOSPITAL_BASED_OUTPATIENT_CLINIC_OR_DEPARTMENT_OTHER): Payer: 59 | Admitting: Physical Therapy

## 2022-02-08 ENCOUNTER — Encounter (HOSPITAL_BASED_OUTPATIENT_CLINIC_OR_DEPARTMENT_OTHER): Payer: Self-pay | Admitting: Physical Therapy

## 2022-02-08 ENCOUNTER — Ambulatory Visit
Admission: RE | Admit: 2022-02-08 | Discharge: 2022-02-08 | Disposition: A | Discharge status: Home / Self Care | Payer: 59 | Source: Ambulatory Visit | Attending: Gastroenterology | Admitting: Gastroenterology

## 2022-02-08 DIAGNOSIS — R143 Flatulence: Secondary | ICD-10-CM

## 2022-02-08 DIAGNOSIS — Z9889 Other specified postprocedural states: Secondary | ICD-10-CM | POA: Diagnosis not present

## 2022-02-08 DIAGNOSIS — R140 Abdominal distension (gaseous): Secondary | ICD-10-CM

## 2022-02-08 DIAGNOSIS — R1032 Left lower quadrant pain: Secondary | ICD-10-CM | POA: Diagnosis not present

## 2022-02-08 DIAGNOSIS — R1084 Generalized abdominal pain: Secondary | ICD-10-CM

## 2022-02-08 DIAGNOSIS — R194 Change in bowel habit: Secondary | ICD-10-CM

## 2022-02-08 DIAGNOSIS — R197 Diarrhea, unspecified: Secondary | ICD-10-CM | POA: Diagnosis not present

## 2022-02-08 MED ORDER — IOPAMIDOL (ISOVUE-300) INJECTION 61%
100.0000 mL | Freq: Once | INTRAVENOUS | Status: AC | PRN
  Administered 2022-02-08: 100 mL via INTRAVENOUS

## 2022-02-09 DIAGNOSIS — R69 Illness, unspecified: Secondary | ICD-10-CM | POA: Diagnosis not present

## 2022-02-11 ENCOUNTER — Ambulatory Visit (HOSPITAL_BASED_OUTPATIENT_CLINIC_OR_DEPARTMENT_OTHER): Payer: 59 | Admitting: Physical Therapy

## 2022-02-11 ENCOUNTER — Encounter (HOSPITAL_BASED_OUTPATIENT_CLINIC_OR_DEPARTMENT_OTHER): Payer: Self-pay

## 2022-02-12 ENCOUNTER — Ambulatory Visit: Payer: 59 | Admitting: Obstetrics & Gynecology

## 2022-02-12 ENCOUNTER — Telehealth: Payer: Self-pay | Admitting: Plastic Surgery

## 2022-02-12 NOTE — Telephone Encounter (Signed)
Left message for pt to return call, that provider needs to reschedule her 11/3 appt to 10/27 due to scheduling conflict.

## 2022-02-15 ENCOUNTER — Ambulatory Visit (HOSPITAL_BASED_OUTPATIENT_CLINIC_OR_DEPARTMENT_OTHER): Payer: 59 | Admitting: Physical Therapy

## 2022-02-15 ENCOUNTER — Telehealth: Payer: Self-pay | Admitting: Plastic Surgery

## 2022-02-15 ENCOUNTER — Ambulatory Visit: Payer: 59 | Admitting: Obstetrics & Gynecology

## 2022-02-15 NOTE — Telephone Encounter (Signed)
2nd Left message for pt to call back to reschedule appt for 03/26/22 due to provider having scheduling conflict.

## 2022-02-16 ENCOUNTER — Telehealth: Payer: Self-pay | Admitting: Plastic Surgery

## 2022-02-16 DIAGNOSIS — R69 Illness, unspecified: Secondary | ICD-10-CM | POA: Diagnosis not present

## 2022-02-16 DIAGNOSIS — Z1231 Encounter for screening mammogram for malignant neoplasm of breast: Secondary | ICD-10-CM | POA: Diagnosis not present

## 2022-02-16 NOTE — Telephone Encounter (Signed)
LVM for pt that 03/26/22 appt has been cancelled and to call the office to reschedule her appt.

## 2022-02-18 ENCOUNTER — Ambulatory Visit (HOSPITAL_BASED_OUTPATIENT_CLINIC_OR_DEPARTMENT_OTHER): Payer: 59 | Admitting: Physical Therapy

## 2022-02-18 ENCOUNTER — Telehealth (HOSPITAL_BASED_OUTPATIENT_CLINIC_OR_DEPARTMENT_OTHER): Payer: Self-pay | Admitting: Physical Therapy

## 2022-02-18 NOTE — Telephone Encounter (Signed)
LVM advising of no show and requested she call to discuss POC as this is her last scheduled appt.  Ana Rose C. Cavon Nicolls PT, DPT 02/18/22 1:17 PM

## 2022-02-19 ENCOUNTER — Other Ambulatory Visit: Payer: Self-pay | Admitting: *Deleted

## 2022-02-19 DIAGNOSIS — E538 Deficiency of other specified B group vitamins: Secondary | ICD-10-CM

## 2022-02-19 DIAGNOSIS — D508 Other iron deficiency anemias: Secondary | ICD-10-CM

## 2022-02-22 ENCOUNTER — Telehealth: Payer: Self-pay | Admitting: *Deleted

## 2022-02-22 ENCOUNTER — Inpatient Hospital Stay: Payer: 59 | Admitting: Hematology and Oncology

## 2022-02-22 ENCOUNTER — Inpatient Hospital Stay: Payer: 59 | Attending: Family Medicine

## 2022-02-22 DIAGNOSIS — R69 Illness, unspecified: Secondary | ICD-10-CM | POA: Diagnosis not present

## 2022-02-22 NOTE — Telephone Encounter (Signed)
TCT patient as she did not show today for scheduled appts. No answer but was able to leave vm message for her to call [phone] and ask for scheduling to get another date and time for her appts.

## 2022-03-01 DIAGNOSIS — R69 Illness, unspecified: Secondary | ICD-10-CM | POA: Diagnosis not present

## 2022-03-08 DIAGNOSIS — R69 Illness, unspecified: Secondary | ICD-10-CM | POA: Diagnosis not present

## 2022-03-15 DIAGNOSIS — R69 Illness, unspecified: Secondary | ICD-10-CM | POA: Diagnosis not present

## 2022-03-17 ENCOUNTER — Other Ambulatory Visit: Payer: Self-pay | Admitting: Gastroenterology

## 2022-03-17 ENCOUNTER — Other Ambulatory Visit: Payer: Self-pay | Admitting: Obstetrics & Gynecology

## 2022-03-20 ENCOUNTER — Other Ambulatory Visit: Payer: Self-pay | Admitting: Obstetrics and Gynecology

## 2022-03-22 ENCOUNTER — Encounter: Payer: Self-pay | Admitting: Hematology and Oncology

## 2022-03-22 DIAGNOSIS — R69 Illness, unspecified: Secondary | ICD-10-CM | POA: Diagnosis not present

## 2022-03-22 NOTE — Telephone Encounter (Signed)
Med refill request:estradiol '1mg'$  tab PO daily Last AEX: 02/04/21 ML Next AEX: Not scheduled Last MMG (if hormonal med) 02/18/21 [facility], neg   Patient was advised 06/2021 to f/u 01/2022 for AEX for future refills.   Rx pended #30/0RF  Routing to Dr. Dellis Filbert to review

## 2022-03-23 ENCOUNTER — Encounter: Payer: Self-pay | Admitting: Hematology and Oncology

## 2022-03-24 ENCOUNTER — Encounter: Payer: Self-pay | Admitting: Hematology and Oncology

## 2022-03-26 ENCOUNTER — Institutional Professional Consult (permissible substitution): Payer: 59 | Admitting: Plastic Surgery

## 2022-03-30 DIAGNOSIS — R69 Illness, unspecified: Secondary | ICD-10-CM | POA: Diagnosis not present

## 2022-04-05 DIAGNOSIS — F4310 Post-traumatic stress disorder, unspecified: Secondary | ICD-10-CM | POA: Diagnosis not present

## 2022-04-05 DIAGNOSIS — R69 Illness, unspecified: Secondary | ICD-10-CM | POA: Diagnosis not present

## 2022-04-07 ENCOUNTER — Ambulatory Visit: Payer: 59 | Admitting: Obstetrics & Gynecology

## 2022-04-07 DIAGNOSIS — Z0289 Encounter for other administrative examinations: Secondary | ICD-10-CM

## 2022-05-07 ENCOUNTER — Inpatient Hospital Stay (HOSPITAL_BASED_OUTPATIENT_CLINIC_OR_DEPARTMENT_OTHER): Payer: 59 | Admitting: Physician Assistant

## 2022-05-07 ENCOUNTER — Other Ambulatory Visit: Payer: Self-pay

## 2022-05-07 ENCOUNTER — Inpatient Hospital Stay: Payer: 59 | Attending: Family Medicine

## 2022-05-07 VITALS — BP 115/79 | HR 96 | Temp 97.5°F | Resp 18 | Ht 66.0 in | Wt 178.5 lb

## 2022-05-07 DIAGNOSIS — E538 Deficiency of other specified B group vitamins: Secondary | ICD-10-CM | POA: Insufficient documentation

## 2022-05-07 DIAGNOSIS — D508 Other iron deficiency anemias: Secondary | ICD-10-CM | POA: Diagnosis not present

## 2022-05-07 DIAGNOSIS — E119 Type 2 diabetes mellitus without complications: Secondary | ICD-10-CM | POA: Insufficient documentation

## 2022-05-07 DIAGNOSIS — Z803 Family history of malignant neoplasm of breast: Secondary | ICD-10-CM | POA: Insufficient documentation

## 2022-05-07 DIAGNOSIS — I509 Heart failure, unspecified: Secondary | ICD-10-CM | POA: Insufficient documentation

## 2022-05-07 DIAGNOSIS — Z800 Family history of malignant neoplasm of digestive organs: Secondary | ICD-10-CM | POA: Diagnosis not present

## 2022-05-07 DIAGNOSIS — Z9884 Bariatric surgery status: Secondary | ICD-10-CM | POA: Insufficient documentation

## 2022-05-07 DIAGNOSIS — Z87891 Personal history of nicotine dependence: Secondary | ICD-10-CM | POA: Insufficient documentation

## 2022-05-07 LAB — CMP (CANCER CENTER ONLY)
ALT: 29 U/L (ref 0–44)
AST: 16 U/L (ref 15–41)
Albumin: 4.1 g/dL (ref 3.5–5.0)
Alkaline Phosphatase: 102 U/L (ref 38–126)
Anion gap: 6 (ref 5–15)
BUN: 15 mg/dL (ref 6–20)
CO2: 26 mmol/L (ref 22–32)
Calcium: 9.2 mg/dL (ref 8.9–10.3)
Chloride: 106 mmol/L (ref 98–111)
Creatinine: 0.95 mg/dL (ref 0.44–1.00)
GFR, Estimated: >60 mL/min (ref >60)
Glucose, Bld: 100 mg/dL — ABNORMAL HIGH (ref 70–99)
Potassium: 3.4 mmol/L — ABNORMAL LOW (ref 3.5–5.1)
Sodium: 138 mmol/L (ref 135–145)
Total Bilirubin: 0.3 mg/dL (ref 0.3–1.2)
Total Protein: 6.4 g/dL — ABNORMAL LOW (ref 6.5–8.1)

## 2022-05-07 LAB — CBC WITH DIFFERENTIAL (CANCER CENTER ONLY)
Abs Immature Granulocytes: 0.01 10*3/uL (ref 0.00–0.07)
Basophils Absolute: 0.1 10*3/uL (ref 0.0–0.1)
Basophils Relative: 1 %
Eosinophils Absolute: 0.1 10*3/uL (ref 0.0–0.5)
Eosinophils Relative: 2 %
HCT: 39.1 % (ref 36.0–46.0)
Hemoglobin: 13 g/dL (ref 12.0–15.0)
Immature Granulocytes: 0 %
Lymphocytes Relative: 49 %
Lymphs Abs: 3.1 10*3/uL (ref 0.7–4.0)
MCH: 29.2 pg (ref 26.0–34.0)
MCHC: 33.2 g/dL (ref 30.0–36.0)
MCV: 87.9 fL (ref 80.0–100.0)
Monocytes Absolute: 0.5 10*3/uL (ref 0.1–1.0)
Monocytes Relative: 8 %
Neutro Abs: 2.5 10*3/uL (ref 1.7–7.7)
Neutrophils Relative %: 40 %
Platelet Count: 281 10*3/uL (ref 150–400)
RBC: 4.45 MIL/uL (ref 3.87–5.11)
RDW: 14.1 % (ref 11.5–15.5)
WBC Count: 6.3 10*3/uL (ref 4.0–10.5)
nRBC: 0 % (ref 0.0–0.2)

## 2022-05-07 LAB — VITAMIN B12: Vitamin B-12: 342 pg/mL (ref 180–914)

## 2022-05-07 LAB — FERRITIN: Ferritin: 33 ng/mL (ref 11–307)

## 2022-05-07 LAB — RETIC PANEL
Immature Retic Fract: 7.2 % (ref 2.3–15.9)
RBC.: 4.43 MIL/uL (ref 3.87–5.11)
Retic Count, Absolute: 66 10*3/uL (ref 19.0–186.0)
Retic Ct Pct: 1.5 % (ref 0.4–3.1)
Reticulocyte Hemoglobin: 31.7 pg (ref >27.9)

## 2022-05-07 LAB — IRON AND IRON BINDING CAPACITY (CC-WL,HP ONLY)
Iron: 81 ug/dL (ref 28–170)
Saturation Ratios: 21 % (ref 10.4–31.8)
TIBC: 395 ug/dL (ref 250–450)
UIBC: 314 ug/dL (ref 148–442)

## 2022-05-09 ENCOUNTER — Encounter: Payer: Self-pay | Admitting: Physician Assistant

## 2022-05-09 NOTE — Progress Notes (Signed)
[facility] [phone]   Fax:([phone]) [phone]  PROGRESS NOTE  Patient Care Team: Aretta Nip, MD as PCP - General (Family Medicine)  Hematological/Oncological History # Normocytic Anemia 1) 11/17/2018: Iron 39, Ferritin 8, Sat 10%, TIBC 408  2) 11/14/2019: Vitamin b12 109, Folate 13.2,  3) 12/03/2019-12/17/2019: received weekly injections of 1000 mcg of Vitamin B12. An additional dose was given on 01/21/2020 4) 01/21/2020: WBC 4.3, Hgb 9.2, MCV 82.6, Plt 350. Vitamin B12 130 (nml 180-914)   5) 02/20/2020: establish care with Dr. Lorenso Courier. WBC 6.9, Hgb 10.0, MCV 83.3, Plt 340 6) 04/25/2020: WBC 4.1, Hgb 9.2, MCV 92.2, Plt 253  7) 5/11-5/18/2022: 2 doses of IV Feraheme 8) 02/20/2021: WBC 3.1, Hgb 12.1, MCV 98.9, Plt 239 9) 08/21/2021: WBC 3.6, Hgb 12.0, Mcv 92.8, Plt 238  Interval History:  Ana Rose 46 y.o. female with medical history significant for vitamin b12 deficiency who presents for a follow up visit. The patient's last visit was on 08/21/2021.  On exam today Ana Rose is unaccompanied for this visit. She reports having worsening fatigue recently which affects her ADLs. She denies any appetite changes. She continues to have diarrhea and recently started lomotil. She denies easy bruising or active bleeding. She reports occasional dizziness and headaches. She denies fevers, chills, sweats, shortness of breath, chest pain, nausea or vomiting.She has no other complaints. A full 10 point ROS is listed below.  MEDICAL HISTORY:  Past Medical History:  Diagnosis Date   ADD (attention deficit disorder)    Alcoholism ([place])    Anemia    Anxiety    Bipolar disorder ([place])    Blood transfusion without reported diagnosis    CIN I (cervical intraepithelial neoplasia I)    Congestive heart failure (CHF) (HCC)    Depression    Diabetes (HCC)    Epileptic seizures ([place])    Last seizure at 46 years of age   GERD (gastroesophageal reflux disease)    Heart murmur     funtional   Hiatal hernia    History of dysfunctional uterine bleeding    Hypercholesteremia 08/31/2016   Noted in note   Hypothyroidism    Irritable bowel syndrome (IBS)    Obesity    PONV (postoperative nausea and vomiting)    Suicide attempt ([place]) 08/24/2012    SURGICAL HISTORY: Past Surgical History:  Procedure Laterality Date   CHOLECYSTECTOMY  2001   COLONOSCOPY  05/27/2017   CYST EXCISION Right 10/24/2018   Procedure: Excision of right inner thigh sebaceous cyst;  Surgeon: Wallace Going, DO;  Location: [facility];  Service: Plastics;  Laterality: Right;   ESOPHAGOGASTRODUODENOSCOPY  05/2012   GASTRIC BYPASS  2010   GYNECOLOGIC CRYOSURGERY  Approximately age 45   LAPAROSCOPIC TOTAL HYSTERECTOMY  12/07/2010   endometriosis on fallopian tubes   LAPAROSCOPIC UNILATERAL SALPINGECTOMY Right 10/30/2018   Procedure: LAPAROSCOPIC UNILATERAL SALPINGECTOMY;  Surgeon: [name], MD;  Location: [facility];  Service: Gynecology;  Laterality: Right;   LAPAROSCOPIC UNILATERAL SALPINGO OOPHERECTOMY Left 10/30/2018   Procedure: LAPAROSCOPIC UNILATERAL SALPINGO OOPHORECTOMY;  Surgeon: [name], MD;  Location: [facility];  Service: Gynecology;  Laterality: Left;  request 7:30am OR time in [place] block requests one hour   TUBAL LIGATION  2001   VAGINAL HYSTERECTOMY     WISDOM TOOTH EXTRACTION      SOCIAL HISTORY: Social History   Socioeconomic History   Marital status: Married    Spouse name:  Not on file   Number of children: 1   Years of education: Not on file   Highest education level: Not on file  Occupational History   Occupation: insurance agent  Tobacco Use   Smoking status: Former    Types: Cigarettes    Quit date: 11/30/1998    Years since quitting: 23.4   Smokeless tobacco: Never  Vaping Use   Vaping Use: Some days  Substance and Sexual Activity   Alcohol use: Not Currently     Alcohol/week: 0.0 standard drinks of alcohol   Drug use: No   Sexual activity: Yes    Partners: Female    Birth control/protection: Surgical    Comment: HYST.-1st intercourse 46 yo-Fewer than 5 partners  Other Topics Concern   Not on file  Social History Narrative   Not on file   Social Determinants of Health   Financial Resource Strain: Not on file  Food Insecurity: Not on file  Transportation Needs: Not on file  Physical Activity: Not on file  Stress: Not on file  Social Connections: Not on file  Intimate Partner Violence: Not on file    FAMILY HISTORY: Family History  Problem Relation Age of Onset   Hypertension Mother    Colon polyps Mother    Irritable bowel syndrome Mother    Hypertension Father    Colon cancer Maternal Grandmother    Crohn's disease Maternal Grandfather    Diabetes Paternal Grandmother    Cancer Paternal Grandmother        OV/UT   Breast cancer Paternal Grandmother 47   Alcoholism Paternal Grandfather    Breast cancer Cousin 47       MATERNAL   Stomach cancer Neg Hx    Esophageal cancer Neg Hx    Rectal cancer Neg Hx     ALLERGIES:  is allergic to hydrocodone, lactose intolerance (gi), nsaids, and xanax [alprazolam].  MEDICATIONS:  Current Outpatient Medications  Medication Sig Dispense Refill   acetaminophen (TYLENOL) 325 MG tablet as needed.     cyanocobalamin (,VITAMIN B-12,) 1000 MCG/ML injection every 30 (thirty) days.     estradiol (ESTRACE) 1 MG tablet Take 1 tablet (1 mg total) by mouth daily. 90 tablet 2   folic acid (FOLVITE) 1 MG tablet Take 1 mg by mouth daily.     guanFACINE (INTUNIV) 4 MG TB24 ER tablet Take 4 mg by mouth every morning.     hydrOXYzine (VISTARIL) 50 MG capsule Take by mouth.     melatonin 3 MG TABS tablet      Multiple Vitamins-Minerals (ONCOVITE) TABS Take 1 tablet by mouth daily.     ondansetron (ZOFRAN) 4 MG tablet Take 4 mg by mouth daily.     pantoprazole (PROTONIX) 40 MG tablet Take 1 tablet (40 mg  total) by mouth at bedtime. 30 tablet 0   sertraline (ZOLOFT) 100 MG tablet Take 150 mg by mouth daily.     Thiamine HCl (B-1 PO) Take by mouth.     topiramate (TOPAMAX) 50 MG tablet Take 100 mg by mouth at bedtime.     traZODone (DESYREL) 50 MG tablet Take 300 mg by mouth at bedtime.     Vitamin D, Ergocalciferol, (DRISDOL) 50000 units CAPS capsule Take 50,000 Units by mouth every 7 (seven) days.     diphenoxylate-atropine (LOMOTIL) 2.5-0.025 MG tablet Take 1 tablet by mouth 4 (four) times daily as needed for diarrhea or loose stools. 60 tablet 1   lamoTRIgine (LAMICTAL) 100 MG tablet Take  100 mg by mouth daily.     traMADol (ULTRAM) 50 MG tablet Take 1 tablet (50 mg total) by mouth every 12 (twelve) hours as needed. 30 tablet 0   Current Facility-Administered Medications  Medication Dose Route Frequency Provider Last Rate Last Admin   0.9 %  sodium chloride infusion  500 mL Intravenous Once Danis, Estill Cotta III, MD        REVIEW OF SYSTEMS:   Constitutional: ( - ) fevers, ( - )  chills , ( - ) night sweats Eyes: ( - ) blurriness of vision, ( - ) double vision, ( - ) watery eyes Ears, nose, mouth, throat, and face: ( - ) mucositis, ( - ) sore throat Respiratory: ( - ) cough, ( - ) dyspnea, ( - ) wheezes Cardiovascular: ( - ) palpitation, ( - ) chest discomfort, ( - ) lower extremity swelling Gastrointestinal:  ( - ) nausea, ( - ) heartburn, ( - ) change in bowel habits Skin: ( - ) abnormal skin rashes Lymphatics: ( - ) new lymphadenopathy, ( - ) easy bruising Neurological: ( - ) numbness, ( - ) tingling, ( - ) new weaknesses Behavioral/Psych: ( - ) mood change, ( - ) new changes  All other systems were reviewed with the patient and are negative.  PHYSICAL EXAMINATION:  Vitals:   05/07/22 1526  BP: 115/79  Pulse: 96  Resp: 18  Temp: (!) 97.5 F (36.4 C)  SpO2: 96%   Filed Weights   05/07/22 1526  Weight: 178 lb 8 oz (81 kg)    GENERAL: well appearing middle aged Caucasian  female. alert, no distress and comfortable SKIN: skin color, texture, turgor are normal, no rashes or significant lesions EYES: conjunctiva are pink and non-injected, sclera clear LUNGS: clear to auscultation and percussion with normal breathing effort HEART: regular rate & rhythm and no murmurs and no lower extremity edema Musculoskeletal: no cyanosis of digits and no clubbing  PSYCH: alert & oriented x 3, fluent speech NEURO: no focal motor/sensory deficits  LABORATORY DATA:  I have reviewed the data as listed    Latest Ref Rng & Units 05/07/2022    2:33 PM 08/21/2021   10:58 AM 02/20/2021    9:37 AM  CBC  WBC 4.0 - 10.5 K/uL 6.3  3.6  3.1   Hemoglobin 12.0 - 15.0 g/dL 13.0  12.0  12.1   Hematocrit 36.0 - 46.0 % 39.1  34.9  36.7   Platelets 150 - 400 K/uL 281  238  239        Latest Ref Rng & Units 05/07/2022    2:33 PM 08/21/2021   10:58 AM 02/20/2021    9:37 AM  CMP  Glucose 70 - 99 mg/dL 100  114  96   BUN 6 - 20 mg/dL _0 Creatinine 0.44 - 1.00 mg/dL 0.95  0.79  0.84   Sodium 135 - 145 mmol/L 138  137  140   Potassium 3.5 - 5.1 mmol/L 3.4  3.9  3.7   Chloride 98 - 111 mmol/L 106  102  105   CO2 22 - 32 mmol/L _1 Calcium 8.9 - 10.3 mg/dL 9.2  8.8  9.0   Total Protein 6.5 - 8.1 g/dL 6.4  7.2  6.4   Total Bilirubin 0.3 - 1.2 mg/dL 0.3  0.5  0.6   Alkaline Phos 38 - 126 U/L 102  102  105  AST 15 - 41 U/L 16  75  35   ALT 0 - 44 U/L 29  35  19     RADIOGRAPHIC STUDIES: No results found.  ASSESSMENT & PLAN Ana Rose is a 46 y.o. female with medical history significant for vitamin b12 deficiency/normocytic anemia who presents for a follow up visit.  After review the labs, the records, discussion with the patient the findings are most consistent with improving anemia.  The patient responded modestly with IM vitamin B12 treatment, however she had a more robust response with IV iron therapy.  At this time her findings are most consistent with anemia  secondary to her gastric bypass surgery.  Due to relatively mild deficiencies in these further work-up was pursued with a bone marrow biopsy.  Bone marrow biopsy was performed on 06/20/2018 and showed normocellular marrow with trilineage hematopoiesis with maturation.  The peripheral blood also only showed a normocytic anemia.  It is possible that the patient's mild appearing iron labs do truly underlay a genuine iron deficiency.    # Vitamin B12 deficiency  #Normocytic Anemia, improving # Iron Deficiency Anemia #s/p Gastric Bypass in 2010 --findings were most consistent with nutritional deficiencies 2/2 to her gastric bypass, however her hemoglobin has continued to drop despite vitamin B12 therapy --prior full nutritional evaluation with Vitamin b12 ,folate, MMA, homocysteine, copper, iron panel, and ferritin showed findings consistent with Vitamin B12 deficiency and mild iron deficiency --after extensive peripheral blood work no etiology has been found. A bone marrow biopsy also showed no clear etiology --Received IV feraheme 510 mg x 2 doses on 10/01/2020-10/08/2020 given the relatively low ferritin and the decreased iron stores within the bone marrow.  Hgb levels improved and eventually back to normal. --Labs today show no evidence of anemia with Hgb 13.0. Iron panel shows decrease in levels with saturation 21%, ferritin 33. Vitamin B12 stable at 342.  --Recommend IV feraheme 510 x 1 dose to bolster iron levels. --RTC in 3 months with repeat labs.    No orders of the defined types were placed in this encounter.   All questions were answered. The patient knows to call the clinic with any problems, questions or concerns.  I have spent a total of 30 minutes minutes of face-to-face and non-face-to-face time, preparing to see the patient, performing a medically appropriate examination, counseling and educating the patient, ordering medications, documenting clinical information in the electronic  health record,and care coordination.   Dede Query PA-C Dept of Hematology and [place] at [facility] Phone: [phone]   05/09/2022 8:30 PM

## 2022-05-10 ENCOUNTER — Telehealth: Payer: Self-pay | Admitting: Hematology and Oncology

## 2022-05-10 ENCOUNTER — Other Ambulatory Visit: Payer: Self-pay | Admitting: Physician Assistant

## 2022-05-10 NOTE — Telephone Encounter (Signed)
Called patient with appointment details. Left voicemail and mailing reminder.

## 2022-05-11 ENCOUNTER — Telehealth: Payer: Self-pay | Admitting: Physician Assistant

## 2022-05-11 NOTE — Telephone Encounter (Signed)
Called to notify patient of new appointments. Left voicemail with upcoming appointment information.

## 2022-05-14 ENCOUNTER — Ambulatory Visit: Payer: 59

## 2022-05-14 ENCOUNTER — Inpatient Hospital Stay: Payer: 59

## 2022-05-14 VITALS — BP 129/90 | HR 89 | Temp 98.0°F | Resp 18

## 2022-05-14 DIAGNOSIS — D649 Anemia, unspecified: Secondary | ICD-10-CM

## 2022-05-14 DIAGNOSIS — I509 Heart failure, unspecified: Secondary | ICD-10-CM | POA: Diagnosis not present

## 2022-05-14 DIAGNOSIS — E538 Deficiency of other specified B group vitamins: Secondary | ICD-10-CM | POA: Diagnosis not present

## 2022-05-14 DIAGNOSIS — Z800 Family history of malignant neoplasm of digestive organs: Secondary | ICD-10-CM | POA: Diagnosis not present

## 2022-05-14 DIAGNOSIS — Z9884 Bariatric surgery status: Secondary | ICD-10-CM | POA: Diagnosis not present

## 2022-05-14 DIAGNOSIS — Z803 Family history of malignant neoplasm of breast: Secondary | ICD-10-CM | POA: Diagnosis not present

## 2022-05-14 DIAGNOSIS — D508 Other iron deficiency anemias: Secondary | ICD-10-CM | POA: Diagnosis not present

## 2022-05-14 DIAGNOSIS — E119 Type 2 diabetes mellitus without complications: Secondary | ICD-10-CM | POA: Diagnosis not present

## 2022-05-14 DIAGNOSIS — Z87891 Personal history of nicotine dependence: Secondary | ICD-10-CM | POA: Diagnosis not present

## 2022-05-14 MED ORDER — SODIUM CHLORIDE 0.9 % IV SOLN: Freq: Once | INTRAVENOUS | Status: AC

## 2022-05-14 MED ORDER — SODIUM CHLORIDE 0.9 % IV SOLN
200.0000 mg | Freq: Once | INTRAVENOUS | Status: AC
  Administered 2022-05-14: 200 mg via INTRAVENOUS
  Filled 2022-05-14: qty 200

## 2022-05-14 NOTE — Patient Instructions (Signed)

## 2022-05-21 ENCOUNTER — Other Ambulatory Visit: Payer: Self-pay

## 2022-05-21 ENCOUNTER — Inpatient Hospital Stay: Payer: 59

## 2022-05-21 ENCOUNTER — Telehealth: Payer: Self-pay | Admitting: Hematology and Oncology

## 2022-05-21 VITALS — BP 128/78 | HR 78 | Temp 98.2°F | Resp 18

## 2022-05-21 DIAGNOSIS — I509 Heart failure, unspecified: Secondary | ICD-10-CM | POA: Diagnosis not present

## 2022-05-21 DIAGNOSIS — Z800 Family history of malignant neoplasm of digestive organs: Secondary | ICD-10-CM | POA: Diagnosis not present

## 2022-05-21 DIAGNOSIS — D508 Other iron deficiency anemias: Secondary | ICD-10-CM | POA: Diagnosis not present

## 2022-05-21 DIAGNOSIS — Z87891 Personal history of nicotine dependence: Secondary | ICD-10-CM | POA: Diagnosis not present

## 2022-05-21 DIAGNOSIS — Z9884 Bariatric surgery status: Secondary | ICD-10-CM | POA: Diagnosis not present

## 2022-05-21 DIAGNOSIS — D649 Anemia, unspecified: Secondary | ICD-10-CM

## 2022-05-21 DIAGNOSIS — Z803 Family history of malignant neoplasm of breast: Secondary | ICD-10-CM | POA: Diagnosis not present

## 2022-05-21 DIAGNOSIS — E538 Deficiency of other specified B group vitamins: Secondary | ICD-10-CM | POA: Diagnosis not present

## 2022-05-21 DIAGNOSIS — E119 Type 2 diabetes mellitus without complications: Secondary | ICD-10-CM | POA: Diagnosis not present

## 2022-05-21 MED ORDER — SODIUM CHLORIDE 0.9 % IV SOLN
200.0000 mg | Freq: Once | INTRAVENOUS | Status: AC
  Administered 2022-05-21: 200 mg via INTRAVENOUS
  Filled 2022-05-21: qty 200

## 2022-05-21 MED ORDER — SODIUM CHLORIDE 0.9 % IV SOLN: Freq: Once | INTRAVENOUS | Status: DC

## 2022-05-21 NOTE — Progress Notes (Signed)
Patient declined 30 minute post iron infusion. Vss upon discharge.

## 2022-05-21 NOTE — Telephone Encounter (Signed)
Called to notify patient to changed appointment time. Patient notified.

## 2022-05-28 ENCOUNTER — Other Ambulatory Visit: Payer: Self-pay

## 2022-05-28 ENCOUNTER — Inpatient Hospital Stay: Payer: 59

## 2022-05-28 ENCOUNTER — Inpatient Hospital Stay: Payer: 59 | Attending: Family Medicine

## 2022-05-28 VITALS — BP 108/77 | HR 85 | Temp 98.3°F | Resp 18

## 2022-05-28 DIAGNOSIS — D508 Other iron deficiency anemias: Secondary | ICD-10-CM | POA: Diagnosis not present

## 2022-05-28 DIAGNOSIS — E538 Deficiency of other specified B group vitamins: Secondary | ICD-10-CM | POA: Diagnosis not present

## 2022-05-28 DIAGNOSIS — D649 Anemia, unspecified: Secondary | ICD-10-CM

## 2022-05-28 MED ORDER — SODIUM CHLORIDE 0.9 % IV SOLN: Freq: Once | INTRAVENOUS | Status: AC

## 2022-05-28 MED ORDER — SODIUM CHLORIDE 0.9 % IV SOLN
200.0000 mg | Freq: Once | INTRAVENOUS | Status: AC
  Administered 2022-05-28: 200 mg via INTRAVENOUS
  Filled 2022-05-28: qty 200

## 2022-05-28 NOTE — Patient Instructions (Signed)

## 2022-05-28 NOTE — Progress Notes (Signed)
Patient tolerated her iron well- has had no issues with past doses and chose to decline her 30 minute observation. VSS- BP 108/77 (BP Location: Right Arm, Patient Position: Sitting)   Pulse 85   Temp 98.3 F (36.8 C) (Oral)   Resp 18   LMP 11/18/2010   SpO2 100%   Ambulatory to the lobby independently.

## 2022-06-21 DIAGNOSIS — R232 Flushing: Secondary | ICD-10-CM | POA: Diagnosis not present

## 2022-07-02 ENCOUNTER — Institutional Professional Consult (permissible substitution): Payer: 59 | Admitting: Plastic Surgery

## 2022-07-12 IMAGING — DX DG KNEE COMPLETE 4+V*L*
4 series · 4 of 4 positions shown · non-contrast
Comparison: None Available.

CLINICAL DATA: Chronic left knee pain.

EXAM:
LEFT KNEE - COMPLETE 4+ VIEW

[knee tunnel]
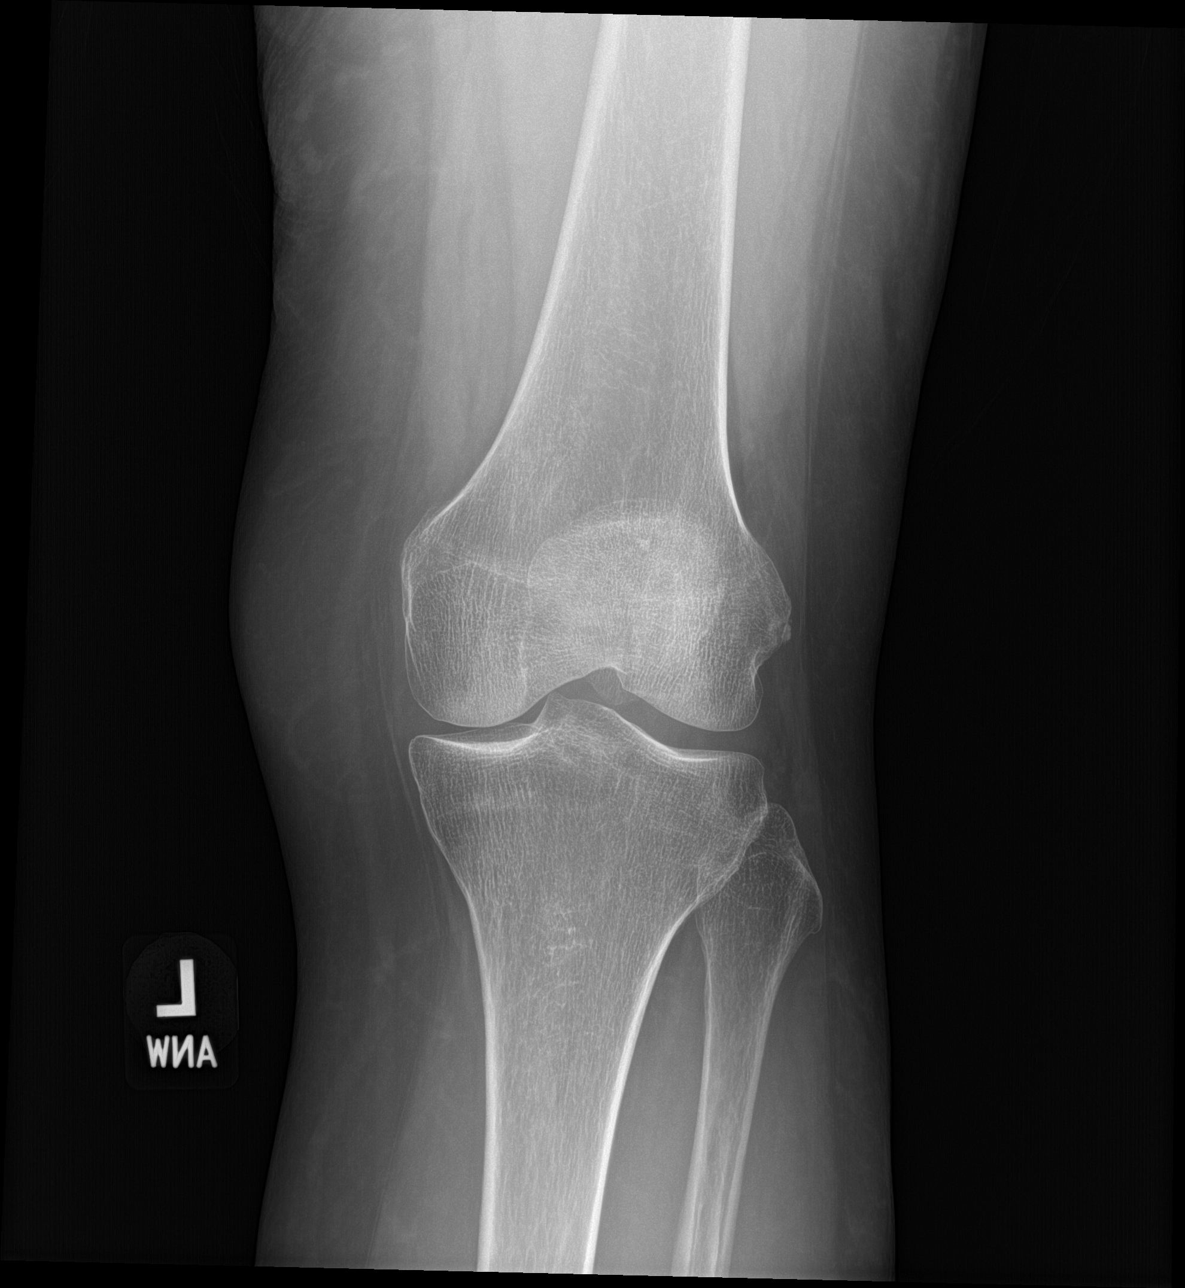

[patella sunrise]
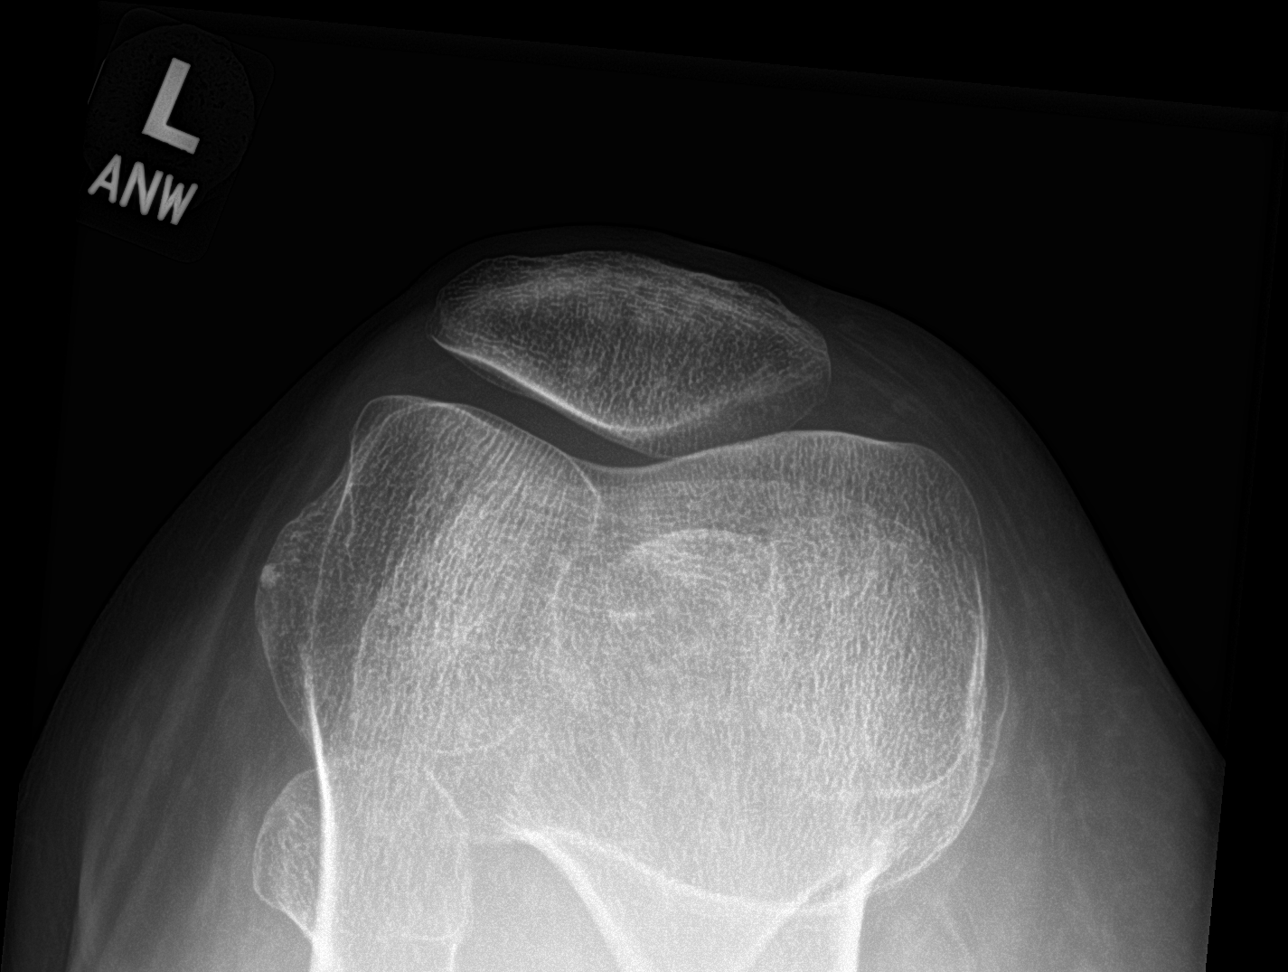

[knee ap]
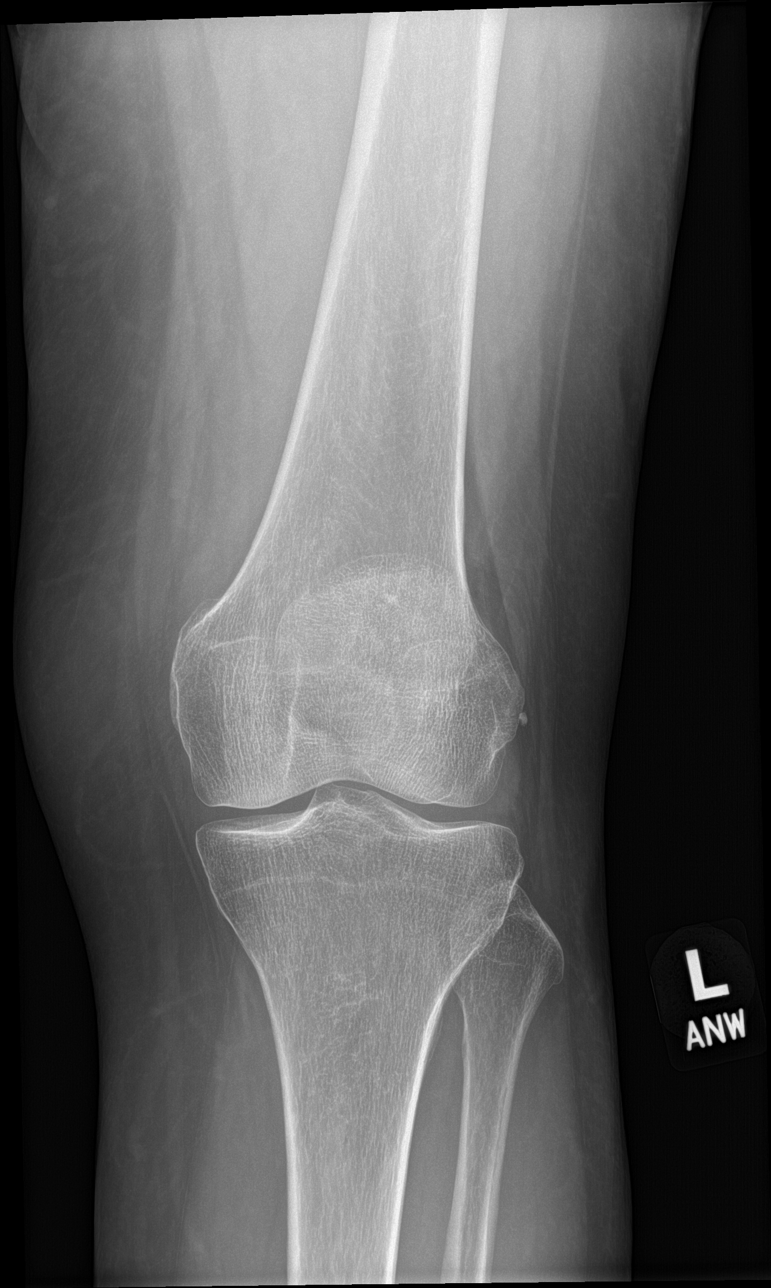

[knee lat]
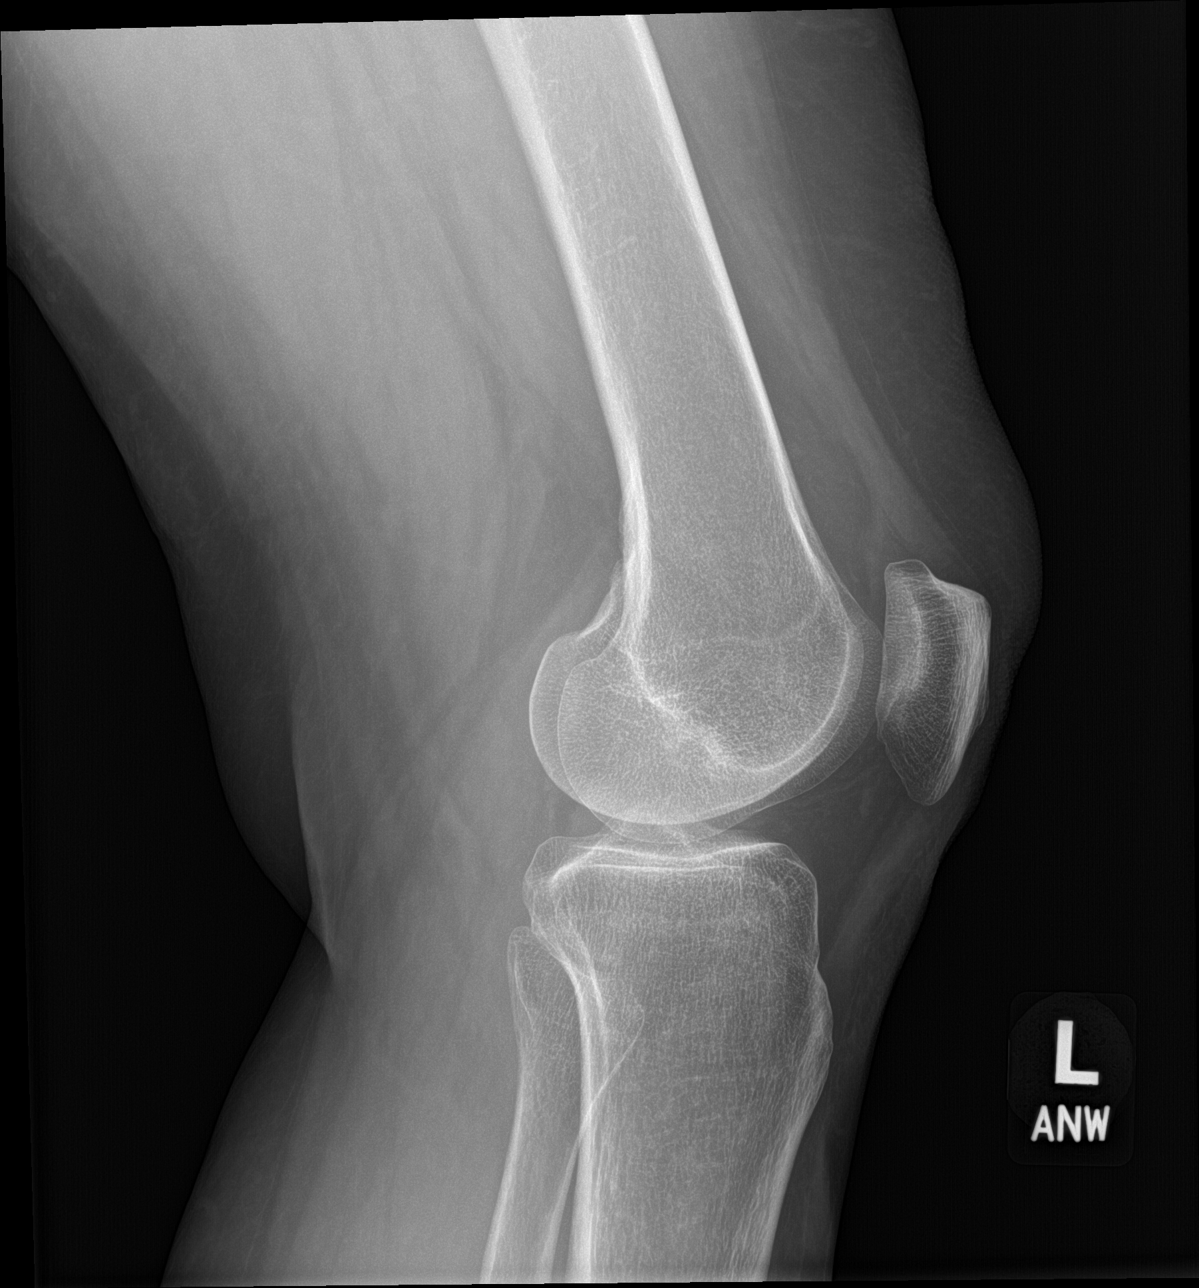

[4 of 4 positions shown; findings below may reference images not displayed]

FINDINGS: No recent fracture or dislocation is seen. There is no significant
effusion. There is 3 mm smooth marginated calcification adjacent to
the lateral margin of lateral condyle of the distal femur. This may
suggest soft tissue calcification from previous injury.
IMPRESSION: No recent fracture or dislocation is seen in the left knee. There is
no significant effusion. No significant degenerative changes are
noted.

There is 3 mm smooth marginated calcification adjacent to the
lateral margin of distal left femur which may suggest soft tissue
calcification from previous injury.

## 2022-07-25 ENCOUNTER — Encounter (HOSPITAL_COMMUNITY): Payer: Self-pay

## 2022-07-25 ENCOUNTER — Emergency Department (HOSPITAL_COMMUNITY)
Admission: EM | Admit: 2022-07-25 | Discharge: 2022-07-25 | Discharge status: Against Medical Advice | Payer: 59 | Attending: Emergency Medicine | Admitting: Emergency Medicine

## 2022-07-25 ENCOUNTER — Other Ambulatory Visit: Payer: Self-pay

## 2022-07-25 DIAGNOSIS — Z5321 Procedure and treatment not carried out due to patient leaving prior to being seen by health care provider: Secondary | ICD-10-CM | POA: Diagnosis not present

## 2022-07-25 DIAGNOSIS — R1110 Vomiting, unspecified: Secondary | ICD-10-CM | POA: Insufficient documentation

## 2022-07-25 DIAGNOSIS — R109 Unspecified abdominal pain: Secondary | ICD-10-CM | POA: Diagnosis not present

## 2022-07-25 LAB — CBC
HCT: 41.5 % (ref 36.0–46.0)
Hemoglobin: 13.7 g/dL (ref 12.0–15.0)
MCH: 28.9 pg (ref 26.0–34.0)
MCHC: 33 g/dL (ref 30.0–36.0)
MCV: 87.6 fL (ref 80.0–100.0)
Platelets: 242 10*3/uL (ref 150–400)
RBC: 4.74 MIL/uL (ref 3.87–5.11)
RDW: 14 % (ref 11.5–15.5)
WBC: 5.7 10*3/uL (ref 4.0–10.5)
nRBC: 0 % (ref 0.0–0.2)

## 2022-07-25 LAB — COMPREHENSIVE METABOLIC PANEL
ALT: 12 U/L (ref 0–44)
AST: 15 U/L (ref 15–41)
Albumin: 4 g/dL (ref 3.5–5.0)
Alkaline Phosphatase: 69 U/L (ref 38–126)
Anion gap: 8 (ref 5–15)
BUN: 11 mg/dL (ref 6–20)
CO2: 26 mmol/L (ref 22–32)
Calcium: 9.1 mg/dL (ref 8.9–10.3)
Chloride: 105 mmol/L (ref 98–111)
Creatinine, Ser: 1.01 mg/dL — ABNORMAL HIGH (ref 0.44–1.00)
GFR, Estimated: >60 mL/min (ref >60)
Glucose, Bld: 103 mg/dL — ABNORMAL HIGH (ref 70–99)
Potassium: 3.8 mmol/L (ref 3.5–5.1)
Sodium: 139 mmol/L (ref 135–145)
Total Bilirubin: 0.4 mg/dL (ref 0.3–1.2)
Total Protein: 7 g/dL (ref 6.5–8.1)

## 2022-07-25 LAB — LIPASE, BLOOD: Lipase: 59 U/L — ABNORMAL HIGH (ref 11–51)

## 2022-07-25 MED ORDER — ONDANSETRON 4 MG PO TBDP
4.0000 mg | ORAL_TABLET | Freq: Once | ORAL | Status: AC | PRN
  Administered 2022-07-25: 4 mg via ORAL
  Filled 2022-07-25: qty 1

## 2022-07-25 NOTE — ED Triage Notes (Signed)
Pt. Arrives POV for emesis. Pt. States that for a couple of days she has had sharp abdominal for the past couple of days. When she woke up this morning she started vomiting. Pt states that her vomit had streaks of blood in it. Pt. Denies drug and alcohol use.

## 2022-07-25 NOTE — ED Notes (Signed)
Pt left the emergency dept of own free will through side door without making RN or provider aware. Pt was located and Pt's IV was taken out in parking lot by  Alberteen Sam, RN. Pt entered POV and left premises.

## 2022-07-26 DIAGNOSIS — F1020 Alcohol dependence, uncomplicated: Secondary | ICD-10-CM | POA: Diagnosis not present

## 2022-07-26 DIAGNOSIS — F411 Generalized anxiety disorder: Secondary | ICD-10-CM | POA: Diagnosis not present

## 2022-07-26 DIAGNOSIS — F331 Major depressive disorder, recurrent, moderate: Secondary | ICD-10-CM | POA: Diagnosis not present

## 2022-07-29 ENCOUNTER — Ambulatory Visit (INDEPENDENT_AMBULATORY_CARE_PROVIDER_SITE_OTHER): Payer: 59

## 2022-07-29 ENCOUNTER — Ambulatory Visit (HOSPITAL_BASED_OUTPATIENT_CLINIC_OR_DEPARTMENT_OTHER): Payer: 59 | Admitting: Orthopaedic Surgery

## 2022-07-29 DIAGNOSIS — M25512 Pain in left shoulder: Secondary | ICD-10-CM | POA: Diagnosis not present

## 2022-07-29 MED ORDER — TRIAMCINOLONE ACETONIDE 40 MG/ML IJ SUSP
80.0000 mg | INTRAMUSCULAR | Status: AC | PRN
  Administered 2022-07-29: 80 mg via INTRA_ARTICULAR

## 2022-07-29 MED ORDER — LIDOCAINE HCL 1 % IJ SOLN
4.0000 mL | INTRAMUSCULAR | Status: AC | PRN
  Administered 2022-07-29: 4 mL

## 2022-07-29 NOTE — Addendum Note (Signed)
Addended by: Yevonne Pax on: 07/29/2022 11:19 AM   Modules accepted: Orders

## 2022-07-29 NOTE — Progress Notes (Signed)
Chief Complaint: Left shoulder pain     History of Present Illness:   07/29/2022: Ana Rose presents today for follow-up of her left shoulder.  Overall she states that she did have a recent bout of COVID with diffuse achy pain.  There is pain radiating down the neck into the posterior aspect of the shoulder.  Since that time she has noticed decreased motion about the shoulder as well with pain.   Ana Rose is a 47 y.o. female presents with 1 year of left knee pain which has been ongoing now and worsening over the last several months.  She does have a history of right knee patellofemoral type pain which was previously treated at [facility].  She is here today for the left knee.  She states that she is tender about the medial aspect of the left knee.  She has not had any injections or physical therapy for this side.  She enjoys doing gardening in her spare time.  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  She is here today with her wife.  She does have pain going up and down stairs and with bending of the knee.  She has pain in the knee with longer drives    Surgical History:   None  PMH/PSH/Family History/Social History/Meds/Allergies:    Past Medical History:  Diagnosis Date   ADD (attention deficit disorder)    Alcoholism ([place])    Anemia    Anxiety    Bipolar disorder ([place])    Blood transfusion without reported diagnosis    CIN I (cervical intraepithelial neoplasia I)    Congestive heart failure (CHF) (HCC)    Depression    Diabetes (HCC)    Epileptic seizures ([place])    Last seizure at 47 years of age   GERD (gastroesophageal reflux disease)    Heart murmur    funtional   Hiatal hernia    History of dysfunctional uterine bleeding    Hypercholesteremia 08/31/2016   Noted in note   Hypothyroidism    Irritable bowel syndrome (IBS)    Obesity    PONV (postoperative nausea and vomiting)    Suicide attempt  ([place]) 08/24/2012   Past Surgical History:  Procedure Laterality Date   CHOLECYSTECTOMY  2001   COLONOSCOPY  05/27/2017   CYST EXCISION Right 10/24/2018   Procedure: Excision of right inner thigh sebaceous cyst;  Surgeon: Wallace Going, DO;  Location: [place];  Service: Plastics;  Laterality: Right;   ESOPHAGOGASTRODUODENOSCOPY  05/2012   GASTRIC BYPASS  2010   GYNECOLOGIC CRYOSURGERY  Approximately age 70   LAPAROSCOPIC TOTAL HYSTERECTOMY  12/07/2010   endometriosis on fallopian tubes   LAPAROSCOPIC UNILATERAL SALPINGECTOMY Right 10/30/2018   Procedure: LAPAROSCOPIC UNILATERAL SALPINGECTOMY;  Surgeon: [name], MD;  Location: [place];  Service: Gynecology;  Laterality: Right;   LAPAROSCOPIC UNILATERAL SALPINGO OOPHERECTOMY Left 10/30/2018   Procedure: LAPAROSCOPIC UNILATERAL SALPINGO OOPHORECTOMY;  Surgeon: [name], MD;  Location: [place];  Service: Gynecology;  Laterality: Left;  request 7:30am OR time in [place] block requests one hour   TUBAL LIGATION  2001   VAGINAL HYSTERECTOMY     WISDOM TOOTH EXTRACTION     Social History   Socioeconomic History   Marital status:  Married    Spouse name: Not on file   Number of children: 1   Years of education: Not on file   Highest education level: Not on file  Occupational History   Occupation: insurance agent  Tobacco Use   Smoking status: Former    Types: Cigarettes    Quit date: 11/30/1998    Years since quitting: 23.6   Smokeless tobacco: Never  Vaping Use   Vaping Use: Some days  Substance and Sexual Activity   Alcohol use: Not Currently    Alcohol/week: 0.0 standard drinks of alcohol   Drug use: No   Sexual activity: Yes    Partners: Female    Birth control/protection: Surgical    Comment: HYST.-1st intercourse 47 yo-Fewer than 5 partners  Other Topics Concern   Not on file  Social History Narrative   Not on file   Social  Determinants of Health   Financial Resource Strain: Not on file  Food Insecurity: Not on file  Transportation Needs: Not on file  Physical Activity: Not on file  Stress: Not on file  Social Connections: Not on file   Family History  Problem Relation Age of Onset   Hypertension Mother    Colon polyps Mother    Irritable bowel syndrome Mother    Hypertension Father    Colon cancer Maternal Grandmother    Crohn's disease Maternal Grandfather    Diabetes Paternal Grandmother    Cancer Paternal Grandmother        OV/UT   Breast cancer Paternal Grandmother 39   Alcoholism Paternal Grandfather    Breast cancer Cousin 45       MATERNAL   Stomach cancer Neg Hx    Esophageal cancer Neg Hx    Rectal cancer Neg Hx    Allergies  Allergen Reactions   Hydrocodone Nausea And Vomiting    vicodin   Lactose Intolerance (Gi) Nausea And Vomiting   Nsaids Other (See Comments)    Oral NSAIDS contraindicated due to Gastric Bypass   Xanax [Alprazolam] Other (See Comments)    Over sedation   Current Outpatient Medications  Medication Sig Dispense Refill   acetaminophen (TYLENOL) 325 MG tablet as needed.     cyanocobalamin (,VITAMIN B-12,) 1000 MCG/ML injection every 30 (thirty) days.     diphenoxylate-atropine (LOMOTIL) 2.5-0.025 MG tablet Take 1 tablet by mouth 4 (four) times daily as needed for diarrhea or loose stools. 60 tablet 1   estradiol (ESTRACE) 1 MG tablet Take 1 tablet (1 mg total) by mouth daily. 90 tablet 2   folic acid (FOLVITE) 1 MG tablet Take 1 mg by mouth daily.     guanFACINE (INTUNIV) 4 MG TB24 ER tablet Take 4 mg by mouth every morning.     hydrOXYzine (VISTARIL) 50 MG capsule Take by mouth.     lamoTRIgine (LAMICTAL) 100 MG tablet Take 100 mg by mouth daily.     melatonin 3 MG TABS tablet      Multiple Vitamins-Minerals (ONCOVITE) TABS Take 1 tablet by mouth daily.     ondansetron (ZOFRAN) 4 MG tablet Take 4 mg by mouth daily.     pantoprazole (PROTONIX) 40 MG tablet  Take 1 tablet (40 mg total) by mouth at bedtime. 30 tablet 0   sertraline (ZOLOFT) 100 MG tablet Take 150 mg by mouth daily.     Thiamine HCl (B-1 PO) Take by mouth.     topiramate (TOPAMAX) 50 MG tablet Take 100 mg by mouth at bedtime.  traMADol (ULTRAM) 50 MG tablet Take 1 tablet (50 mg total) by mouth every 12 (twelve) hours as needed. 30 tablet 0   traZODone (DESYREL) 50 MG tablet Take 300 mg by mouth at bedtime.     Vitamin D, Ergocalciferol, (DRISDOL) 50000 units CAPS capsule Take 50,000 Units by mouth every 7 (seven) days.     Current Facility-Administered Medications  Medication Dose Route Frequency Provider Last Rate Last Admin   0.9 %  sodium chloride infusion  500 mL Intravenous Once Nelida Meuse III, MD       No results found.  Review of Systems:   A ROS was performed including pertinent positives and negatives as documented in the HPI.  Physical Exam :   Constitutional: NAD and appears stated age Neurological: Alert and oriented Psych: Appropriate affect and cooperative Last menstrual period 11/18/2010.   Comprehensive Musculoskeletal Exam:      Musculoskeletal Exam  Gait Normal  Alignment Normal   Right Left  Inspection Normal Normal  Palpation    Tenderness None Medial joint  Crepitus None None  Effusion None None  Range of Motion    Extension 0 0  Flexion 135 135  Strength    Extension 5/5 5/5  Flexion 5/5 5/5  Ligament Exam     Generalized Laxity No No  Lachman Negative Negative   Pivot Shift Negative Negative  Anterior Drawer Negative Negative  Valgus at 0 Negative Negative  Valgus at 20 Negative Negative  Varus at 0 0 0  Varus at 20   0 0  Posterior Drawer at 90 0 0  Vascular/Lymphatic Exam    Edema None None  Venous Stasis Changes No No  Distal Circulation Normal Normal  Neurologic    Light Touch Sensation Intact Intact  Special Tests: Tenderness about the medial pes tendon     Left shoulder with tenderness about the glenohumeral  joint.  Active forward elevation is to 170 degrees equal to contralateral side.  External rotation at the side is to 50 degrees compared to 70 on the contralateral side.  Internal rotation is to L5 compared to L1 on the right.  Passive pain with external rotation  Imaging:   Xray (3 views left shoulder): Normal   I personally reviewed and interpreted the radiographs.   Assessment:   47 y.o. female with left shoulder pain consistent with early adhesive capsulitis.  I do believe that overall she did possibly have some cervical radiculopathy which is resolving although this did lead to unfortunately an episode of adhesive capsulitis.  She is compensating with the trapezius muscle and to that effect she is having some cervical symptoms as well.  Side effect I do believe she would benefit from physical therapy to work on her neck and gentle range of motion about the shoulder.  Have also recommended ultrasound-guided injection of the left humeral joint at this time. Plan :    -Plan for left shoulder glenohumeral ultrasound-guided injection of verbal consent obtained -PT ordered  -I will plan to see her back in 2 weeks for assessment of the effectiveness of the injection   Procedure Note  Patient: Ana Rose             Date of Birth: 1975/11/18           MRN: [id number]             Visit Date: 07/29/2022  Procedures: Visit Diagnoses:  1. Left shoulder pain, unspecified chronicity     Large Joint  Inj: L glenohumeral on 07/29/2022 11:15 AM Indications: pain Details: 22 G 1.5 in needle, ultrasound-guided anterior approach  Arthrogram: No  Medications: 4 mL lidocaine 1 %; 80 mg triamcinolone acetonide 40 MG/ML Outcome: tolerated well, no immediate complications Procedure, treatment alternatives, risks and benefits explained, specific risks discussed. Consent was given by the patient. Immediately prior to procedure a time out was called to verify the correct patient, procedure, equipment,  support staff and site/side marked as required. Patient was prepped and draped in the usual sterile fashion.            I personally saw and evaluated the patient, and participated in the management and treatment plan.  Vanetta Mulders, MD Attending Physician, Orthopedic Surgery  This document was dictated using Dragon voice recognition software. A reasonable attempt at proof reading has been made to minimize errors.

## 2022-08-04 DIAGNOSIS — D7589 Other specified diseases of blood and blood-forming organs: Secondary | ICD-10-CM | POA: Diagnosis not present

## 2022-08-04 DIAGNOSIS — Z13220 Encounter for screening for lipoid disorders: Secondary | ICD-10-CM | POA: Diagnosis not present

## 2022-08-04 DIAGNOSIS — D510 Vitamin B12 deficiency anemia due to intrinsic factor deficiency: Secondary | ICD-10-CM | POA: Diagnosis not present

## 2022-08-04 DIAGNOSIS — F3160 Bipolar disorder, current episode mixed, unspecified: Secondary | ICD-10-CM | POA: Diagnosis not present

## 2022-08-04 DIAGNOSIS — K219 Gastro-esophageal reflux disease without esophagitis: Secondary | ICD-10-CM | POA: Diagnosis not present

## 2022-08-09 DIAGNOSIS — F1020 Alcohol dependence, uncomplicated: Secondary | ICD-10-CM | POA: Diagnosis not present

## 2022-08-09 DIAGNOSIS — F411 Generalized anxiety disorder: Secondary | ICD-10-CM | POA: Diagnosis not present

## 2022-08-12 ENCOUNTER — Other Ambulatory Visit: Payer: Self-pay | Admitting: Hematology and Oncology

## 2022-08-12 ENCOUNTER — Ambulatory Visit (HOSPITAL_BASED_OUTPATIENT_CLINIC_OR_DEPARTMENT_OTHER): Payer: 59 | Admitting: Orthopaedic Surgery

## 2022-08-12 DIAGNOSIS — M25512 Pain in left shoulder: Secondary | ICD-10-CM | POA: Diagnosis not present

## 2022-08-12 DIAGNOSIS — D649 Anemia, unspecified: Secondary | ICD-10-CM

## 2022-08-12 DIAGNOSIS — M5412 Radiculopathy, cervical region: Secondary | ICD-10-CM | POA: Diagnosis not present

## 2022-08-12 DIAGNOSIS — M7502 Adhesive capsulitis of left shoulder: Secondary | ICD-10-CM

## 2022-08-12 MED ORDER — LIDOCAINE HCL 1 % IJ SOLN
4.0000 mL | INTRAMUSCULAR | Status: AC | PRN
  Administered 2022-08-12: 4 mL

## 2022-08-12 MED ORDER — TRIAMCINOLONE ACETONIDE 40 MG/ML IJ SUSP
80.0000 mg | INTRAMUSCULAR | Status: AC | PRN
  Administered 2022-08-12: 80 mg via INTRA_ARTICULAR

## 2022-08-12 NOTE — Progress Notes (Signed)
Chief Complaint: Left shoulder pain     History of Present Illness:   08/12/2022: Ana Rose presents today for follow-up of her left shoulder.  Overall she got approximately 75% relief from her first injection.  She is hoping to have an additional injection for her adhesive capsulitis as she did respond quite well to this.   Ana Rose is a 47 y.o. female presents with 1 year of left knee pain which has been ongoing now and worsening over the last several months.  She does have a history of right knee patellofemoral type pain which was previously treated at [facility].  She is here today for the left knee.  She states that she is tender about the medial aspect of the left knee.  She has not had any injections or physical therapy for this side.  She enjoys doing gardening in her spare time.  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  She is here today with her wife.  She does have pain going up and down stairs and with bending of the knee.  She has pain in the knee with longer drives    Surgical History:   None  PMH/PSH/Family History/Social History/Meds/Allergies:    Past Medical History:  Diagnosis Date   ADD (attention deficit disorder)    Alcoholism ([place])    Anemia    Anxiety    Bipolar disorder ([place])    Blood transfusion without reported diagnosis    CIN I (cervical intraepithelial neoplasia I)    Congestive heart failure (CHF) (HCC)    Depression    Diabetes (HCC)    Epileptic seizures ([place])    Last seizure at 47 years of age   GERD (gastroesophageal reflux disease)    Heart murmur    funtional   Hiatal hernia    History of dysfunctional uterine bleeding    Hypercholesteremia 08/31/2016   Noted in note   Hypothyroidism    Irritable bowel syndrome (IBS)    Obesity    PONV (postoperative nausea and vomiting)    Suicide attempt ([place]) 08/24/2012   Past Surgical History:  Procedure Laterality Date    CHOLECYSTECTOMY  2001   COLONOSCOPY  05/27/2017   CYST EXCISION Right 10/24/2018   Procedure: Excision of right inner thigh sebaceous cyst;  Surgeon: Wallace Going, DO;  Location: [place];  Service: Plastics;  Laterality: Right;   ESOPHAGOGASTRODUODENOSCOPY  05/2012   GASTRIC BYPASS  2010   GYNECOLOGIC CRYOSURGERY  Approximately age 3   LAPAROSCOPIC TOTAL HYSTERECTOMY  12/07/2010   endometriosis on fallopian tubes   LAPAROSCOPIC UNILATERAL SALPINGECTOMY Right 10/30/2018   Procedure: LAPAROSCOPIC UNILATERAL SALPINGECTOMY;  Surgeon: [name], MD;  Location: [place];  Service: Gynecology;  Laterality: Right;   LAPAROSCOPIC UNILATERAL SALPINGO OOPHERECTOMY Left 10/30/2018   Procedure: LAPAROSCOPIC UNILATERAL SALPINGO OOPHORECTOMY;  Surgeon: [name], MD;  Location: [place];  Service: Gynecology;  Laterality: Left;  request 7:30am OR time in [place] block requests one hour   TUBAL LIGATION  2001   VAGINAL HYSTERECTOMY     WISDOM TOOTH EXTRACTION     Social History   Socioeconomic History   Marital status: Married    Spouse name: Not on file   Number of children: 1  Years of education: Not on file   Highest education level: Not on file  Occupational History   Occupation: insurance agent  Tobacco Use   Smoking status: Former    Types: Cigarettes    Quit date: 11/30/1998    Years since quitting: 23.7   Smokeless tobacco: Never  Vaping Use   Vaping Use: Some days  Substance and Sexual Activity   Alcohol use: Not Currently    Alcohol/week: 0.0 standard drinks of alcohol   Drug use: No   Sexual activity: Yes    Partners: Female    Birth control/protection: Surgical    Comment: HYST.-1st intercourse 47 yo-Fewer than 5 partners  Other Topics Concern   Not on file  Social History Narrative   Not on file   Social Determinants of Health   Financial Resource Strain: Not on file  Food  Insecurity: Not on file  Transportation Needs: Not on file  Physical Activity: Not on file  Stress: Not on file  Social Connections: Not on file   Family History  Problem Relation Age of Onset   Hypertension Mother    Colon polyps Mother    Irritable bowel syndrome Mother    Hypertension Father    Colon cancer Maternal Grandmother    Crohn's disease Maternal Grandfather    Diabetes Paternal Grandmother    Cancer Paternal Grandmother        OV/UT   Breast cancer Paternal Grandmother 62   Alcoholism Paternal Grandfather    Breast cancer Cousin 45       MATERNAL   Stomach cancer Neg Hx    Esophageal cancer Neg Hx    Rectal cancer Neg Hx    Allergies  Allergen Reactions   Hydrocodone Nausea And Vomiting    vicodin   Lactose Intolerance (Gi) Nausea And Vomiting   Nsaids Other (See Comments)    Oral NSAIDS contraindicated due to Gastric Bypass   Xanax [Alprazolam] Other (See Comments)    Over sedation   Current Outpatient Medications  Medication Sig Dispense Refill   acetaminophen (TYLENOL) 325 MG tablet as needed.     cyanocobalamin (,VITAMIN B-12,) 1000 MCG/ML injection every 30 (thirty) days.     diphenoxylate-atropine (LOMOTIL) 2.5-0.025 MG tablet Take 1 tablet by mouth 4 (four) times daily as needed for diarrhea or loose stools. 60 tablet 1   estradiol (ESTRACE) 1 MG tablet Take 1 tablet (1 mg total) by mouth daily. 90 tablet 2   folic acid (FOLVITE) 1 MG tablet Take 1 mg by mouth daily.     guanFACINE (INTUNIV) 4 MG TB24 ER tablet Take 4 mg by mouth every morning.     hydrOXYzine (VISTARIL) 50 MG capsule Take by mouth.     lamoTRIgine (LAMICTAL) 100 MG tablet Take 100 mg by mouth daily.     melatonin 3 MG TABS tablet      Multiple Vitamins-Minerals (ONCOVITE) TABS Take 1 tablet by mouth daily.     ondansetron (ZOFRAN) 4 MG tablet Take 4 mg by mouth daily.     pantoprazole (PROTONIX) 40 MG tablet Take 1 tablet (40 mg total) by mouth at bedtime. 30 tablet 0   sertraline  (ZOLOFT) 100 MG tablet Take 150 mg by mouth daily.     Thiamine HCl (B-1 PO) Take by mouth.     topiramate (TOPAMAX) 50 MG tablet Take 100 mg by mouth at bedtime.     traMADol (ULTRAM) 50 MG tablet Take 1 tablet (50 mg total) by mouth every  12 (twelve) hours as needed. 30 tablet 0   traZODone (DESYREL) 50 MG tablet Take 300 mg by mouth at bedtime.     Vitamin D, Ergocalciferol, (DRISDOL) 50000 units CAPS capsule Take 50,000 Units by mouth every 7 (seven) days.     Current Facility-Administered Medications  Medication Dose Route Frequency Provider Last Rate Last Admin   0.9 %  sodium chloride infusion  500 mL Intravenous Once Nelida Meuse III, MD       No results found.  Review of Systems:   A ROS was performed including pertinent positives and negatives as documented in the HPI.  Physical Exam :   Constitutional: NAD and appears stated age Neurological: Alert and oriented Psych: Appropriate affect and cooperative Last menstrual period 11/18/2010.   Comprehensive Musculoskeletal Exam:      Musculoskeletal Exam  Gait Normal  Alignment Normal   Right Left  Inspection Normal Normal  Palpation    Tenderness None Medial joint  Crepitus None None  Effusion None None  Range of Motion    Extension 0 0  Flexion 135 135  Strength    Extension 5/5 5/5  Flexion 5/5 5/5  Ligament Exam     Generalized Laxity No No  Lachman Negative Negative   Pivot Shift Negative Negative  Anterior Drawer Negative Negative  Valgus at 0 Negative Negative  Valgus at 20 Negative Negative  Varus at 0 0 0  Varus at 20   0 0  Posterior Drawer at 90 0 0  Vascular/Lymphatic Exam    Edema None None  Venous Stasis Changes No No  Distal Circulation Normal Normal  Neurologic    Light Touch Sensation Intact Intact  Special Tests: Tenderness about the medial pes tendon     Left shoulder with tenderness about the glenohumeral joint.  Active forward elevation is to 170 degrees equal to contralateral  side.  External rotation at the side is to 50 degrees compared to 70 on the contralateral side.  Internal rotation is to L5 compared to L1 on the right.  Passive pain with external rotation  Imaging:   Xray (3 views left shoulder): Normal   I personally reviewed and interpreted the radiographs.   Assessment:   47 y.o. female with left shoulder pain consistent with early adhesive capsulitis.  I do believe that overall she did possibly have some cervical radiculopathy which is resolving although this did lead to unfortunately an episode of adhesive capsulitis.  She is compensating with the trapezius muscle and to that effect she is having some cervical symptoms as well.  Side effect I do believe she would benefit from physical therapy to work on her neck and gentle range of motion about the shoulder.  Have also recommended ultrasound-guided injection of the left humeral joint at this time. Plan :    -Plan to see her back as needed should she not get 98% relief from this additional injection.  She will begin physical therapy at this time.   Procedure Note  Patient: Ana Rose             Date of Birth: Nov 08, 1975           MRN: [id number]             Visit Date: 08/12/2022  Procedures: Visit Diagnoses:  No diagnosis found.   Large Joint Inj: L glenohumeral on 08/12/2022 12:35 PM Indications: pain Details: 22 G 1.5 in needle, ultrasound-guided anterior approach  Arthrogram: No  Medications: 4 mL  lidocaine 1 %; 80 mg triamcinolone acetonide 40 MG/ML Outcome: tolerated well, no immediate complications Procedure, treatment alternatives, risks and benefits explained, specific risks discussed. Consent was given by the patient. Immediately prior to procedure a time out was called to verify the correct patient, procedure, equipment, support staff and site/side marked as required. Patient was prepped and draped in the usual sterile fashion.            I personally saw and  evaluated the patient, and participated in the management and treatment plan.  Vanetta Mulders, MD Attending Physician, Orthopedic Surgery  This document was dictated using Dragon voice recognition software. A reasonable attempt at proof reading has been made to minimize errors.

## 2022-08-13 ENCOUNTER — Inpatient Hospital Stay (HOSPITAL_BASED_OUTPATIENT_CLINIC_OR_DEPARTMENT_OTHER): Payer: 59 | Admitting: Hematology and Oncology

## 2022-08-13 ENCOUNTER — Inpatient Hospital Stay: Payer: 59 | Attending: Family Medicine

## 2022-08-13 ENCOUNTER — Other Ambulatory Visit: Payer: Self-pay

## 2022-08-13 VITALS — BP 150/103 | HR 81 | Temp 97.7°F | Resp 16 | Wt 173.3 lb

## 2022-08-13 DIAGNOSIS — Z803 Family history of malignant neoplasm of breast: Secondary | ICD-10-CM | POA: Insufficient documentation

## 2022-08-13 DIAGNOSIS — Z9884 Bariatric surgery status: Secondary | ICD-10-CM | POA: Insufficient documentation

## 2022-08-13 DIAGNOSIS — E538 Deficiency of other specified B group vitamins: Secondary | ICD-10-CM | POA: Insufficient documentation

## 2022-08-13 DIAGNOSIS — D649 Anemia, unspecified: Secondary | ICD-10-CM

## 2022-08-13 DIAGNOSIS — Z87891 Personal history of nicotine dependence: Secondary | ICD-10-CM | POA: Diagnosis not present

## 2022-08-13 DIAGNOSIS — E119 Type 2 diabetes mellitus without complications: Secondary | ICD-10-CM | POA: Insufficient documentation

## 2022-08-13 DIAGNOSIS — D508 Other iron deficiency anemias: Secondary | ICD-10-CM | POA: Diagnosis not present

## 2022-08-13 DIAGNOSIS — D509 Iron deficiency anemia, unspecified: Secondary | ICD-10-CM | POA: Insufficient documentation

## 2022-08-13 DIAGNOSIS — Z800 Family history of malignant neoplasm of digestive organs: Secondary | ICD-10-CM | POA: Diagnosis not present

## 2022-08-13 LAB — CBC WITH DIFFERENTIAL (CANCER CENTER ONLY)
Abs Immature Granulocytes: 0.01 10*3/uL (ref 0.00–0.07)
Basophils Absolute: 0 10*3/uL (ref 0.0–0.1)
Basophils Relative: 1 %
Eosinophils Absolute: 0 10*3/uL (ref 0.0–0.5)
Eosinophils Relative: 0 %
HCT: 44.7 % (ref 36.0–46.0)
Hemoglobin: 14.7 g/dL (ref 12.0–15.0)
Immature Granulocytes: 0 %
Lymphocytes Relative: 26 %
Lymphs Abs: 1.6 10*3/uL (ref 0.7–4.0)
MCH: 28.9 pg (ref 26.0–34.0)
MCHC: 32.9 g/dL (ref 30.0–36.0)
MCV: 87.8 fL (ref 80.0–100.0)
Monocytes Absolute: 0.4 10*3/uL (ref 0.1–1.0)
Monocytes Relative: 6 %
Neutro Abs: 4.2 10*3/uL (ref 1.7–7.7)
Neutrophils Relative %: 67 %
Platelet Count: 269 10*3/uL (ref 150–400)
RBC: 5.09 MIL/uL (ref 3.87–5.11)
RDW: 14.7 % (ref 11.5–15.5)
WBC Count: 6.2 10*3/uL (ref 4.0–10.5)
nRBC: 0 % (ref 0.0–0.2)

## 2022-08-13 LAB — RETIC PANEL
Immature Retic Fract: 9.7 % (ref 2.3–15.9)
RBC.: 5.08 MIL/uL (ref 3.87–5.11)
Retic Count, Absolute: 66 10*3/uL (ref 19.0–186.0)
Retic Ct Pct: 1.3 % (ref 0.4–3.1)
Reticulocyte Hemoglobin: 34 pg (ref >27.9)

## 2022-08-13 LAB — IRON AND IRON BINDING CAPACITY (CC-WL,HP ONLY)
Iron: 135 ug/dL (ref 28–170)
Saturation Ratios: 36 % — ABNORMAL HIGH (ref 10.4–31.8)
TIBC: 372 ug/dL (ref 250–450)
UIBC: 237 ug/dL (ref 148–442)

## 2022-08-13 LAB — CMP (CANCER CENTER ONLY)
ALT: 9 U/L (ref 0–44)
AST: 12 U/L — ABNORMAL LOW (ref 15–41)
Albumin: 4.5 g/dL (ref 3.5–5.0)
Alkaline Phosphatase: 66 U/L (ref 38–126)
Anion gap: 7 (ref 5–15)
BUN: 10 mg/dL (ref 6–20)
CO2: 27 mmol/L (ref 22–32)
Calcium: 9.6 mg/dL (ref 8.9–10.3)
Chloride: 105 mmol/L (ref 98–111)
Creatinine: 0.78 mg/dL (ref 0.44–1.00)
GFR, Estimated: >60 mL/min (ref >60)
Glucose, Bld: 118 mg/dL — ABNORMAL HIGH (ref 70–99)
Potassium: 3.7 mmol/L (ref 3.5–5.1)
Sodium: 139 mmol/L (ref 135–145)
Total Bilirubin: 0.5 mg/dL (ref 0.3–1.2)
Total Protein: 7.3 g/dL (ref 6.5–8.1)

## 2022-08-13 LAB — FERRITIN: Ferritin: 67 ng/mL (ref 11–307)

## 2022-08-13 LAB — VITAMIN B12: Vitamin B-12: 170 pg/mL — ABNORMAL LOW (ref 180–914)

## 2022-08-13 MED ORDER — VITAMIN D (ERGOCALCIFEROL) 1.25 MG (50000 UNIT) PO CAPS: 50000.0000 [IU] | ORAL_CAPSULE | ORAL | 3 refills | Status: AC

## 2022-08-13 NOTE — Progress Notes (Unsigned)
[place] [phone]   [phone]) [phone]  PROGRESS NOTE  Patient Care Team: Aretta Nip, MD as PCP - General (Family Medicine)  Hematological/Oncological History # Normocytic Anemia 1) 11/17/2018: Iron 39, Ferritin 8, Sat 10%, TIBC 408  2) 11/14/2019: Vitamin b12 109, Folate 13.2,  3) 12/03/2019-12/17/2019: received weekly injections of 1000 mcg of Vitamin B12. An additional dose was given on 01/21/2020 4) 01/21/2020: WBC 4.3, Hgb 9.2, MCV 82.6, Plt 350. Vitamin B12 130 (nml 180-914)   5) 02/20/2020: establish care with Dr. Lorenso Courier. WBC 6.9, Hgb 10.0, MCV 83.3, Plt 340 6) 04/25/2020: WBC 4.1, Hgb 9.2, MCV 92.2, Plt 253  7) 5/11-5/18/2022: 2 doses of IV Feraheme 8) 02/20/2021: WBC 3.1, Hgb 12.1, MCV 98.9, Plt 239 9) 08/21/2021: WBC 3.6, Hgb 12.0, Mcv 92.8, Plt 238  Interval History:  Ana Rose 47 y.o. female with medical history significant for vitamin b12 deficiency who presents for a follow up visit. The patient's last visit was on 08/21/2021.  On exam today Ana Rose is unaccompanied for this visit. She reports having worsening fatigue recently which affects her ADLs. She denies any appetite changes. She continues to have diarrhea and recently started lomotil. She denies easy bruising or active bleeding. She reports occasional dizziness and headaches. She denies fevers, chills, sweats, shortness of breath, chest pain, nausea or vomiting.She has no other complaints. A full 10 point ROS is listed below.  MEDICAL HISTORY:  Past Medical History:  Diagnosis Date   ADD (attention deficit disorder)    Alcoholism ([place])    Anemia    Anxiety    Bipolar disorder ([place])    Blood transfusion without reported diagnosis    CIN I (cervical intraepithelial neoplasia I)    Congestive heart failure (CHF) (H[facility])    Depression    Diabetes ([facility])    Epileptic seizures ([place])    Last seizure at 47 years of age   GERD (gastroesophageal reflux disease)    Heart murmur     funtional   Hiatal hernia    History of dysfunctional uterine bleeding    Hypercholesteremia 08/31/2016   Noted in note   Hypothyroidism    Irritable bowel syndrome (IBS)    Obesity    PONV (postoperative nausea and vomiting)    Suicide attempt ([place]) 08/24/2012    SURGICAL HISTORY: Past Surgical History:  Procedure Laterality Date   CHOLECYSTECTOMY  2001   COLONOSCOPY  05/27/2017   CYST EXCISION Right 10/24/2018   Procedure: Excision of right inner thigh sebaceous cyst;  Surgeon: Wallace Going, DO;  Location: [facility];  Service: Plastics;  Laterality: Right;   ESOPHAGOGASTRODUODENOSCOPY  05/2012   GASTRIC BYPASS  2010   GYNECOLOGIC CRYOSURGERY  Approximately age 45   LAPAROSCOPIC TOTAL HYSTERECTOMY  12/07/2010   endometriosis on fallopian tubes   LAPAROSCOPIC UNILATERAL SALPINGECTOMY Right 10/30/2018   Procedure: LAPAROSCOPIC UNILATERAL SALPINGECTOMY;  Surgeon: [name], MD;  Location: [facility];  Service: Gynecology;  Laterality: Right;   LAPAROSCOPIC UNILATERAL SALPINGO OOPHERECTOMY Left 10/30/2018   Procedure: LAPAROSCOPIC UNILATERAL SALPINGO OOPHORECTOMY;  Surgeon: [name], MD;  Location: [facility];  Service: Gynecology;  Laterality: Left;  request 7:30am OR time in [place] block requests one hour   TUBAL LIGATION  2001   VAGINAL HYSTERECTOMY     WISDOM TOOTH EXTRACTION      SOCIAL HISTORY: Social History   Socioeconomic History   Marital status: Married    Spouse name:  Not on file   Number of children: 1   Years of education: Not on file   Highest education level: Not on file  Occupational History   Occupation: insurance agent  Tobacco Use   Smoking status: Former    Types: Cigarettes    Quit date: 11/30/1998    Years since quitting: 23.7   Smokeless tobacco: Never  Vaping Use   Vaping Use: Some days  Substance and Sexual Activity   Alcohol use: Not Currently     Alcohol/week: 0.0 standard drinks of alcohol   Drug use: No   Sexual activity: Yes    Partners: Female    Birth control/protection: Surgical    Comment: HYST.-1st intercourse 47 yo-Fewer than 5 partners  Other Topics Concern   Not on file  Social History Narrative   Not on file   Social Determinants of Health   Financial Resource Strain: Not on file  Food Insecurity: Not on file  Transportation Needs: Not on file  Physical Activity: Not on file  Stress: Not on file  Social Connections: Not on file  Intimate Partner Violence: Not on file    FAMILY HISTORY: Family History  Problem Relation Age of Onset   Hypertension Mother    Colon polyps Mother    Irritable bowel syndrome Mother    Hypertension Father    Colon cancer Maternal Grandmother    Crohn's disease Maternal Grandfather    Diabetes Paternal Grandmother    Cancer Paternal Grandmother        OV/UT   Breast cancer Paternal Grandmother 78   Alcoholism Paternal Grandfather    Breast cancer Cousin 72       MATERNAL   Stomach cancer Neg Hx    Esophageal cancer Neg Hx    Rectal cancer Neg Hx     ALLERGIES:  is allergic to hydrocodone, lactose intolerance (gi), nsaids, and xanax [alprazolam].  MEDICATIONS:  Current Outpatient Medications  Medication Sig Dispense Refill   acetaminophen (TYLENOL) 325 MG tablet as needed.     buPROPion (WELLBUTRIN XL) 300 MG 24 hr tablet 1 tablet in the morning Orally Once a day for 90 days     cyanocobalamin (,VITAMIN B-12,) 1000 MCG/ML injection every 30 (thirty) days.     folic acid (FOLVITE) 1 MG tablet Take 1 mg by mouth daily.     melatonin 3 MG TABS tablet      Multiple Vitamins-Minerals (ONCOVITE) TABS Take 1 tablet by mouth daily.     topiramate (TOPAMAX) 50 MG tablet Take 100 mg by mouth at bedtime.     traZODone (DESYREL) 50 MG tablet Take 300 mg by mouth at bedtime.     diphenoxylate-atropine (LOMOTIL) 2.5-0.025 MG tablet Take 1 tablet by mouth 4 (four) times daily as  needed for diarrhea or loose stools. (Patient not taking: Reported on 08/13/2022) 60 tablet 1   hydrOXYzine (VISTARIL) 50 MG capsule Take by mouth. (Patient not taking: Reported on 08/13/2022)     ondansetron (ZOFRAN) 4 MG tablet Take 4 mg by mouth daily. (Patient not taking: Reported on 08/13/2022)     Current Facility-Administered Medications  Medication Dose Route Frequency Provider Last Rate Last Admin   0.9 %  sodium chloride infusion  500 mL Intravenous Once Nelida Meuse III, MD        REVIEW OF SYSTEMS:   Constitutional: ( - ) fevers, ( - )  chills , ( - ) night sweats Eyes: ( - ) blurriness of vision, ( - )  double vision, ( - ) watery eyes Ears, nose, mouth, throat, and face: ( - ) mucositis, ( - ) sore throat Respiratory: ( - ) cough, ( - ) dyspnea, ( - ) wheezes Cardiovascular: ( - ) palpitation, ( - ) chest discomfort, ( - ) lower extremity swelling Gastrointestinal:  ( - ) nausea, ( - ) heartburn, ( - ) change in bowel habits Skin: ( - ) abnormal skin rashes Lymphatics: ( - ) new lymphadenopathy, ( - ) easy bruising Neurological: ( - ) numbness, ( - ) tingling, ( - ) new weaknesses Behavioral/Psych: ( - ) mood change, ( - ) new changes  All other systems were reviewed with the patient and are negative.  PHYSICAL EXAMINATION:  Vitals:   08/13/22 1130  BP: (!) 150/103  Pulse: 81  Resp: 16  Temp: 97.7 F (36.5 C)  SpO2: 100%   Filed Weights   08/13/22 1130  Weight: 173 lb 4.8 oz (78.6 kg)    GENERAL: well appearing middle aged Caucasian female. alert, no distress and comfortable SKIN: skin color, texture, turgor are normal, no rashes or significant lesions EYES: conjunctiva are pink and non-injected, sclera clear LUNGS: clear to auscultation and percussion with normal breathing effort HEART: regular rate & rhythm and no murmurs and no lower extremity edema Musculoskeletal: no cyanosis of digits and no clubbing  PSYCH: alert & oriented x 3, fluent speech NEURO: no  focal motor/sensory deficits  LABORATORY DATA:  I have reviewed the data as listed    Latest Ref Rng & Units 08/13/2022   11:04 AM 07/25/2022    5:43 AM 05/07/2022    2:33 PM  CBC  WBC 4.0 - 10.5 K/uL 6.2  5.7  6.3   Hemoglobin 12.0 - 15.0 g/dL 14.7  13.7  13.0   Hematocrit 36.0 - 46.0 % 44.7  41.5  39.1   Platelets 150 - 400 K/uL 269  242  281        Latest Ref Rng & Units 08/13/2022   11:04 AM 07/25/2022    5:43 AM 05/07/2022    2:33 PM  CMP  Glucose 70 - 99 mg/dL 118  103  100   BUN 6 - 20 mg/dL 10  11  15    Creatinine 0.44 - 1.00 mg/dL 0.78  1.01  0.95   Sodium 135 - 145 mmol/L 139  139  138   Potassium 3.5 - 5.1 mmol/L 3.7  3.8  3.4   Chloride 98 - 111 mmol/L 105  105  106   CO2 22 - 32 mmol/L 27  26  26    Calcium 8.9 - 10.3 mg/dL 9.6  9.1  9.2   Total Protein 6.5 - 8.1 g/dL 7.3  7.0  6.4   Total Bilirubin 0.3 - 1.2 mg/dL 0.5  0.4  0.3   Alkaline Phos 38 - 126 U/L 66  69  102   AST 15 - 41 U/L 12  15  16    ALT 0 - 44 U/L 9  12  29      RADIOGRAPHIC STUDIES: DG Shoulder Left  Result Date: 07/30/2022 CLINICAL DATA:  Pain EXAM: LEFT SHOULDER - 2+ VIEW COMPARISON:  None Available. FINDINGS: There is no evidence of fracture or dislocation. There is no evidence of arthropathy or other focal bone abnormality. Soft tissues are unremarkable. IMPRESSION: Negative. Electronically Signed   By: Dorise Bullion III M.D.   On: 07/30/2022 12:45    ASSESSMENT & PLAN Ana Rose is  a 47 y.o. female with medical history significant for vitamin b12 deficiency/normocytic anemia who presents for a follow up visit.  After review the labs, the records, discussion with the patient the findings are most consistent with improving anemia.  The patient responded modestly with IM vitamin B12 treatment, however she had a more robust response with IV iron therapy.  At this time her findings are most consistent with anemia secondary to her gastric bypass surgery.  Due to relatively mild deficiencies in  these further work-up was pursued with a bone marrow biopsy.  Bone marrow biopsy was performed on 06/20/2018 and showed normocellular marrow with trilineage hematopoiesis with maturation.  The peripheral blood also only showed a normocytic anemia.  It is possible that the patient's mild appearing iron labs do truly underlay a genuine iron deficiency.    # Vitamin B12 deficiency  #Normocytic Anemia, improving # Iron Deficiency Anemia #s/p Gastric Bypass in 2010 --findings were most consistent with nutritional deficiencies 2/2 to her gastric bypass, however her hemoglobin has continued to drop despite vitamin B12 therapy --prior full nutritional evaluation with Vitamin b12 ,folate, MMA, homocysteine, copper, iron panel, and ferritin showed findings consistent with Vitamin B12 deficiency and mild iron deficiency --after extensive peripheral blood work no etiology has been found. A bone marrow biopsy also showed no clear etiology --Received IV feraheme 510 mg x 2 doses on 10/01/2020-10/08/2020 given the relatively low ferritin and the decreased iron stores within the bone marrow.  Hgb levels improved and eventually back to normal. --Labs today show no evidence of anemia with Hgb 13.0. Iron panel shows decrease in levels with saturation 21%, ferritin 33. Vitamin B12 stable at 342.  --Recommend IV feraheme 510 x 1 dose to bolster iron levels. --RTC in 3 months with repeat labs.    No orders of the defined types were placed in this encounter.   All questions were answered. The patient knows to call the clinic with any problems, questions or concerns.  I have spent a total of 30 minutes minutes of face-to-face and non-face-to-face time, preparing to see the patient, performing a medically appropriate examination, counseling and educating the patient, ordering medications, documenting clinical information in the electronic health record,and care coordination.   Ledell Peoples, MD Department of  Hematology/Oncology [place] [facility] Phone: [phone] Pager: [phone] Email: [email or web address]    08/13/2022 12:04 PM

## 2022-08-15 ENCOUNTER — Encounter: Payer: Self-pay | Admitting: Physician Assistant

## 2022-08-16 ENCOUNTER — Telehealth: Payer: Self-pay | Admitting: Hematology and Oncology

## 2022-08-16 NOTE — Telephone Encounter (Signed)
Per 3/22 LOS reached out to patient to schedule left voicemail.

## 2022-08-20 ENCOUNTER — Telehealth: Payer: Self-pay | Admitting: *Deleted

## 2022-08-20 ENCOUNTER — Encounter: Payer: Self-pay | Admitting: *Deleted

## 2022-08-20 LAB — METHYLMALONIC ACID, SERUM: Methylmalonic Acid, Quantitative: 288 nmol/L (ref 0–378)

## 2022-08-20 NOTE — Telephone Encounter (Addendum)
-----   Message from Orson Slick, MD sent at 08/20/2022  2:31 PM EDT ----- Please let Ana Rose know that her iron levels are strong at ferritin 66. Unfortunately her Vitamin b12 has dropped to 170 (goal >300). Please encourage her to continue her vitamin b12 shots at home. We will plan to see her back as scheduled in 6 months.  Contacted patient with information in message above. Voice mail not named, so left message for patient to contact office for test results. Also informed patient that this information will be sent via [place].

## 2022-08-24 DIAGNOSIS — F411 Generalized anxiety disorder: Secondary | ICD-10-CM | POA: Diagnosis not present

## 2022-08-24 DIAGNOSIS — F1020 Alcohol dependence, uncomplicated: Secondary | ICD-10-CM | POA: Diagnosis not present

## 2022-09-14 DIAGNOSIS — F1020 Alcohol dependence, uncomplicated: Secondary | ICD-10-CM | POA: Diagnosis not present

## 2022-09-14 DIAGNOSIS — F411 Generalized anxiety disorder: Secondary | ICD-10-CM | POA: Diagnosis not present

## 2022-09-29 DIAGNOSIS — F411 Generalized anxiety disorder: Secondary | ICD-10-CM | POA: Diagnosis not present

## 2022-09-29 DIAGNOSIS — F1020 Alcohol dependence, uncomplicated: Secondary | ICD-10-CM | POA: Diagnosis not present

## 2022-10-13 DIAGNOSIS — F411 Generalized anxiety disorder: Secondary | ICD-10-CM | POA: Diagnosis not present

## 2022-10-13 DIAGNOSIS — F1020 Alcohol dependence, uncomplicated: Secondary | ICD-10-CM | POA: Diagnosis not present

## 2022-10-27 DIAGNOSIS — F411 Generalized anxiety disorder: Secondary | ICD-10-CM | POA: Diagnosis not present

## 2022-10-27 DIAGNOSIS — F1020 Alcohol dependence, uncomplicated: Secondary | ICD-10-CM | POA: Diagnosis not present

## 2022-11-10 DIAGNOSIS — F411 Generalized anxiety disorder: Secondary | ICD-10-CM | POA: Diagnosis not present

## 2022-11-10 DIAGNOSIS — F1020 Alcohol dependence, uncomplicated: Secondary | ICD-10-CM | POA: Diagnosis not present

## 2022-11-22 DIAGNOSIS — F411 Generalized anxiety disorder: Secondary | ICD-10-CM | POA: Diagnosis not present

## 2022-11-22 DIAGNOSIS — F1021 Alcohol dependence, in remission: Secondary | ICD-10-CM | POA: Diagnosis not present

## 2022-12-06 DIAGNOSIS — F1021 Alcohol dependence, in remission: Secondary | ICD-10-CM | POA: Diagnosis not present

## 2022-12-06 DIAGNOSIS — F411 Generalized anxiety disorder: Secondary | ICD-10-CM | POA: Diagnosis not present

## 2022-12-17 DIAGNOSIS — F1021 Alcohol dependence, in remission: Secondary | ICD-10-CM | POA: Diagnosis not present

## 2022-12-17 DIAGNOSIS — F411 Generalized anxiety disorder: Secondary | ICD-10-CM | POA: Diagnosis not present

## 2022-12-21 DIAGNOSIS — F1021 Alcohol dependence, in remission: Secondary | ICD-10-CM | POA: Diagnosis not present

## 2022-12-21 DIAGNOSIS — F411 Generalized anxiety disorder: Secondary | ICD-10-CM | POA: Diagnosis not present

## 2023-01-05 DIAGNOSIS — F1021 Alcohol dependence, in remission: Secondary | ICD-10-CM | POA: Diagnosis not present

## 2023-01-05 DIAGNOSIS — F4310 Post-traumatic stress disorder, unspecified: Secondary | ICD-10-CM | POA: Diagnosis not present

## 2023-01-05 DIAGNOSIS — F411 Generalized anxiety disorder: Secondary | ICD-10-CM | POA: Diagnosis not present

## 2023-01-19 DIAGNOSIS — F411 Generalized anxiety disorder: Secondary | ICD-10-CM | POA: Diagnosis not present

## 2023-01-19 DIAGNOSIS — F1021 Alcohol dependence, in remission: Secondary | ICD-10-CM | POA: Diagnosis not present

## 2023-01-19 DIAGNOSIS — F4310 Post-traumatic stress disorder, unspecified: Secondary | ICD-10-CM | POA: Diagnosis not present

## 2023-02-02 DIAGNOSIS — F411 Generalized anxiety disorder: Secondary | ICD-10-CM | POA: Diagnosis not present

## 2023-02-02 DIAGNOSIS — F1021 Alcohol dependence, in remission: Secondary | ICD-10-CM | POA: Diagnosis not present

## 2023-02-02 DIAGNOSIS — F4310 Post-traumatic stress disorder, unspecified: Secondary | ICD-10-CM | POA: Diagnosis not present

## 2023-02-07 DIAGNOSIS — F1090 Alcohol use, unspecified, uncomplicated: Secondary | ICD-10-CM | POA: Diagnosis not present

## 2023-02-07 DIAGNOSIS — K219 Gastro-esophageal reflux disease without esophagitis: Secondary | ICD-10-CM | POA: Diagnosis not present

## 2023-02-07 DIAGNOSIS — G43009 Migraine without aura, not intractable, without status migrainosus: Secondary | ICD-10-CM | POA: Diagnosis not present

## 2023-02-07 DIAGNOSIS — Z6824 Body mass index (BMI) 24.0-24.9, adult: Secondary | ICD-10-CM | POA: Diagnosis not present

## 2023-02-08 ENCOUNTER — Other Ambulatory Visit: Payer: Self-pay | Admitting: Hematology and Oncology

## 2023-02-08 DIAGNOSIS — D508 Other iron deficiency anemias: Secondary | ICD-10-CM

## 2023-02-08 DIAGNOSIS — E538 Deficiency of other specified B group vitamins: Secondary | ICD-10-CM

## 2023-02-09 ENCOUNTER — Inpatient Hospital Stay: Payer: 59 | Attending: Hematology and Oncology

## 2023-02-09 DIAGNOSIS — D508 Other iron deficiency anemias: Secondary | ICD-10-CM

## 2023-02-09 DIAGNOSIS — E538 Deficiency of other specified B group vitamins: Secondary | ICD-10-CM | POA: Insufficient documentation

## 2023-02-09 DIAGNOSIS — F17290 Nicotine dependence, other tobacco product, uncomplicated: Secondary | ICD-10-CM | POA: Diagnosis not present

## 2023-02-09 DIAGNOSIS — Z9884 Bariatric surgery status: Secondary | ICD-10-CM | POA: Insufficient documentation

## 2023-02-09 DIAGNOSIS — D509 Iron deficiency anemia, unspecified: Secondary | ICD-10-CM | POA: Diagnosis not present

## 2023-02-09 DIAGNOSIS — Z803 Family history of malignant neoplasm of breast: Secondary | ICD-10-CM | POA: Insufficient documentation

## 2023-02-09 DIAGNOSIS — Z800 Family history of malignant neoplasm of digestive organs: Secondary | ICD-10-CM | POA: Insufficient documentation

## 2023-02-09 LAB — FOLATE: Folate: 7.8 ng/mL (ref >5.9)

## 2023-02-09 LAB — CBC WITH DIFFERENTIAL (CANCER CENTER ONLY)
Abs Immature Granulocytes: 0 10*3/uL (ref 0.00–0.07)
Basophils Absolute: 0 10*3/uL (ref 0.0–0.1)
Basophils Relative: 1 %
Eosinophils Absolute: 0.1 10*3/uL (ref 0.0–0.5)
Eosinophils Relative: 2 %
HCT: 39.9 % (ref 36.0–46.0)
Hemoglobin: 13.5 g/dL (ref 12.0–15.0)
Immature Granulocytes: 0 %
Lymphocytes Relative: 46 %
Lymphs Abs: 1.9 10*3/uL (ref 0.7–4.0)
MCH: 30 pg (ref 26.0–34.0)
MCHC: 33.8 g/dL (ref 30.0–36.0)
MCV: 88.7 fL (ref 80.0–100.0)
Monocytes Absolute: 0.3 10*3/uL (ref 0.1–1.0)
Monocytes Relative: 8 %
Neutro Abs: 1.7 10*3/uL (ref 1.7–7.7)
Neutrophils Relative %: 43 %
Platelet Count: 263 10*3/uL (ref 150–400)
RBC: 4.5 MIL/uL (ref 3.87–5.11)
RDW: 13.7 % (ref 11.5–15.5)
WBC Count: 4 10*3/uL (ref 4.0–10.5)
nRBC: 0 % (ref 0.0–0.2)

## 2023-02-09 LAB — VITAMIN B12: Vitamin B-12: 226 pg/mL (ref 180–914)

## 2023-02-09 LAB — FERRITIN: Ferritin: 99 ng/mL (ref 11–307)

## 2023-02-09 LAB — CMP (CANCER CENTER ONLY)
ALT: 36 U/L (ref 0–44)
AST: 14 U/L — ABNORMAL LOW (ref 15–41)
Albumin: 4.3 g/dL (ref 3.5–5.0)
Alkaline Phosphatase: 97 U/L (ref 38–126)
Anion gap: 7 (ref 5–15)
BUN: 17 mg/dL (ref 6–20)
CO2: 28 mmol/L (ref 22–32)
Calcium: 9.5 mg/dL (ref 8.9–10.3)
Chloride: 105 mmol/L (ref 98–111)
Creatinine: 0.9 mg/dL (ref 0.44–1.00)
GFR, Estimated: >60 mL/min (ref >60)
Glucose, Bld: 100 mg/dL — ABNORMAL HIGH (ref 70–99)
Potassium: 3.9 mmol/L (ref 3.5–5.1)
Sodium: 140 mmol/L (ref 135–145)
Total Bilirubin: 0.6 mg/dL (ref 0.3–1.2)
Total Protein: 7.2 g/dL (ref 6.5–8.1)

## 2023-02-09 LAB — IRON AND IRON BINDING CAPACITY (CC-WL,HP ONLY)
Iron: 83 ug/dL (ref 28–170)
Saturation Ratios: 25 % (ref 10.4–31.8)
TIBC: 333 ug/dL (ref 250–450)
UIBC: 250 ug/dL (ref 148–442)

## 2023-02-11 LAB — METHYLMALONIC ACID, SERUM: Methylmalonic Acid, Quantitative: 204 nmol/L (ref 0–378)

## 2023-02-16 ENCOUNTER — Inpatient Hospital Stay: Payer: 59 | Admitting: Hematology and Oncology

## 2023-02-16 VITALS — BP 125/91 | HR 91 | Temp 97.9°F | Resp 17 | Wt 158.3 lb

## 2023-02-16 DIAGNOSIS — F17290 Nicotine dependence, other tobacco product, uncomplicated: Secondary | ICD-10-CM | POA: Diagnosis not present

## 2023-02-16 DIAGNOSIS — Z9884 Bariatric surgery status: Secondary | ICD-10-CM | POA: Diagnosis not present

## 2023-02-16 DIAGNOSIS — D509 Iron deficiency anemia, unspecified: Secondary | ICD-10-CM | POA: Diagnosis not present

## 2023-02-16 DIAGNOSIS — D649 Anemia, unspecified: Secondary | ICD-10-CM | POA: Diagnosis not present

## 2023-02-16 DIAGNOSIS — D508 Other iron deficiency anemias: Secondary | ICD-10-CM | POA: Diagnosis not present

## 2023-02-16 DIAGNOSIS — E538 Deficiency of other specified B group vitamins: Secondary | ICD-10-CM | POA: Diagnosis not present

## 2023-02-16 DIAGNOSIS — Z803 Family history of malignant neoplasm of breast: Secondary | ICD-10-CM | POA: Diagnosis not present

## 2023-02-16 DIAGNOSIS — Z800 Family history of malignant neoplasm of digestive organs: Secondary | ICD-10-CM | POA: Diagnosis not present

## 2023-02-16 NOTE — Progress Notes (Signed)
[facility] [phone]   [phone]  PROGRESS NOTE  Patient Care Team: Clayborn Heron, MD as PCP - General (Family Medicine)  Hematological/Oncological History # Normocytic Anemia 1) 11/17/2018: Iron 39, Ferritin 8, Sat 10%, TIBC 408  2) 11/14/2019: Vitamin b12 109, Folate 13.2,  3) 12/03/2019-12/17/2019: received weekly injections of 1000 mcg of Vitamin B12. An additional dose was given on 01/21/2020 4) 01/21/2020: WBC 4.3, Hgb 9.2, MCV 82.6, Plt 350. Vitamin B12 130 (nml 180-914)   5) 02/20/2020: establish care with Dr. Leonides Schanz. WBC 6.9, Hgb 10.0, MCV 83.3, Plt 340 6) 04/25/2020: WBC 4.1, Hgb 9.2, MCV 92.2, Plt 253  7) 5/11-5/18/2022: 2 doses of IV Feraheme 8) 02/20/2021: WBC 3.1, Hgb 12.1, MCV 98.9, Plt 239 9) 08/21/2021: WBC 3.6, Hgb 12.0, Mcv 92.8, Plt 238  Interval History:  Ana Rose 47 y.o. female with medical history significant for vitamin b12 deficiency who presents for a follow up visit. The patient's last visit was on 08/13/2022.  On exam today Ana Rose is accompanied by her significant other.  She reports that she has been in good health in the since her last visit with no major changes.  She has had no emergency room visits, hospitalizations, or new medicines.  She reports her energy is about a 6 out of 10.  She does get some occasional episodes of dizziness but no lightheadedness or shortness of breath.  She reports that her appetite has been good though she has unfortunately been losing weight.  Her weight has dropped about 15 pounds.  She is unsure why this is happening.  She notes that she continues to take her vitamin B12 monthly and tolerates it well.  She does have some occasional bruising but otherwise no overt signs of bleeding.  She reports she is up-to-date on her colonoscopy and will be getting a mammogram next week.  She denies fevers, chills, sweats, shortness of breath, chest pain, nausea or vomiting.She has no other  complaints. A full 10 point ROS is listed below.  MEDICAL HISTORY:  Past Medical History:  Diagnosis Date   ADD (attention deficit disorder)    Alcoholism (HCC)    Anemia    Anxiety    Bipolar disorder (HCC)    Blood transfusion without reported diagnosis    CIN I (cervical intraepithelial neoplasia I)    Congestive heart failure (CHF) (HCC)    Depression    Diabetes (HCC)    Epileptic seizures (HCC)    Last seizure at 47 years of age   GERD (gastroesophageal reflux disease)    Heart murmur    funtional   Hiatal hernia    History of dysfunctional uterine bleeding    Hypercholesteremia 08/31/2016   Noted in note   Hypothyroidism    Irritable bowel syndrome (IBS)    Obesity    PONV (postoperative nausea and vomiting)    Suicide attempt (HCC) 08/24/2012    SURGICAL HISTORY: Past Surgical History:  Procedure Laterality Date   CHOLECYSTECTOMY  2001   COLONOSCOPY  05/27/2017   CYST EXCISION Right 10/24/2018   Procedure: Excision of right inner thigh sebaceous cyst;  Surgeon: Peggye Form, DO;  Location: [facility];  Service: Plastics;  Laterality: Right;   ESOPHAGOGASTRODUODENOSCOPY  05/2012   GASTRIC BYPASS  2010   GYNECOLOGIC CRYOSURGERY  Approximately age 70   LAPAROSCOPIC TOTAL HYSTERECTOMY  12/07/2010   endometriosis on fallopian tubes   LAPAROSCOPIC UNILATERAL SALPINGECTOMY Right 10/30/2018   Procedure: LAPAROSCOPIC UNILATERAL  SALPINGECTOMY;  Surgeon: Dara Lords, MD;  Location: [facility];  Service: Gynecology;  Laterality: Right;   LAPAROSCOPIC UNILATERAL SALPINGO OOPHERECTOMY Left 10/30/2018   Procedure: LAPAROSCOPIC UNILATERAL SALPINGO OOPHORECTOMY;  Surgeon: Dara Lords, MD;  Location: [facility];  Service: Gynecology;  Laterality: Left;  request 7:30am OR time in [place] [facility] block requests one hour   TUBAL LIGATION  2001   VAGINAL HYSTERECTOMY     WISDOM TOOTH EXTRACTION      SOCIAL  HISTORY: Social History   Socioeconomic History   Marital status: Married    Spouse name: Not on file   Number of children: 1   Years of education: Not on file   Highest education level: Not on file  Occupational History   Occupation: insurance agent  Tobacco Use   Smoking status: Former    Current packs/day: 0.00    Types: Cigarettes    Quit date: 11/30/1998    Years since quitting: 24.2   Smokeless tobacco: Never  Vaping Use   Vaping status: Some Days  Substance and Sexual Activity   Alcohol use: Not Currently    Alcohol/week: 0.0 standard drinks of alcohol   Drug use: No   Sexual activity: Yes    Partners: Female    Birth control/protection: Surgical    Comment: HYST.-1st intercourse 47 yo-Fewer than 5 partners  Other Topics Concern   Not on file  Social History Narrative   Not on file   Social Determinants of Health   Financial Resource Strain: Not on file  Food Insecurity: Not on file  Transportation Needs: Not on file  Physical Activity: Not on file  Stress: Not on file  Social Connections: Unknown (09/30/2021)   Received from [facility], [facility]   Social Network    Social Network: Not on file  Intimate Partner Violence: Unknown (08/26/2021)   Received from [employer], [facility]   HITS    Physically Hurt: Not on file    Insult or Talk Down To: Not on file    Threaten Physical Harm: Not on file    Scream or Curse: Not on file    FAMILY HISTORY: Family History  Problem Relation Age of Onset   Hypertension Mother    Colon polyps Mother    Irritable bowel syndrome Mother    Hypertension Father    Colon cancer Maternal Grandmother    Crohn's disease Maternal Grandfather    Diabetes Paternal Grandmother    Cancer Paternal Grandmother        OV/UT   Breast cancer Paternal Grandmother 50   Alcoholism Paternal Grandfather    Breast cancer Cousin 12       MATERNAL   Stomach cancer Neg Hx    Esophageal cancer Neg Hx    Rectal cancer Neg Hx      ALLERGIES:  is allergic to hydrocodone, lactose intolerance (gi), nsaids, and xanax [alprazolam].  MEDICATIONS:  Current Outpatient Medications  Medication Sig Dispense Refill   acetaminophen (TYLENOL) 325 MG tablet as needed.     buPROPion (WELLBUTRIN XL) 300 MG 24 hr tablet 1 tablet in the morning Orally Once a day for 90 days     cyanocobalamin (,VITAMIN B-12,) 1000 MCG/ML injection every 30 (thirty) days.     diphenoxylate-atropine (LOMOTIL) 2.5-0.025 MG tablet Take 1 tablet by mouth 4 (four) times daily as needed for diarrhea or loose stools. (Patient not taking: Reported on 08/13/2022) 60 tablet 1   folic acid (FOLVITE)  1 MG tablet Take 1 mg by mouth daily.     hydrOXYzine (VISTARIL) 50 MG capsule Take by mouth. (Patient not taking: Reported on 08/13/2022)     melatonin 3 MG TABS tablet      Multiple Vitamins-Minerals (ONCOVITE) TABS Take 1 tablet by mouth daily.     ondansetron (ZOFRAN) 4 MG tablet Take 4 mg by mouth daily. (Patient not taking: Reported on 08/13/2022)     topiramate (TOPAMAX) 50 MG tablet Take 100 mg by mouth at bedtime.     traZODone (DESYREL) 50 MG tablet Take 300 mg by mouth at bedtime.     Vitamin D, Ergocalciferol, (DRISDOL) 1.25 MG (50000 UNIT) CAPS capsule Take 1 capsule (50,000 Units total) by mouth every 7 (seven) days. 12 capsule 3   Current Facility-Administered Medications  Medication Dose Route Frequency Provider Last Rate Last Admin   0.9 %  sodium chloride infusion  500 mL Intravenous Once Danis, Starr Lake III, MD        REVIEW OF SYSTEMS:   Constitutional: ( - ) fevers, ( - )  chills , ( - ) night sweats Eyes: ( - ) blurriness of vision, ( - ) double vision, ( - ) watery eyes Ears, nose, mouth, throat, and face: ( - ) mucositis, ( - ) sore throat Respiratory: ( - ) cough, ( - ) dyspnea, ( - ) wheezes Cardiovascular: ( - ) palpitation, ( - ) chest discomfort, ( - ) lower extremity swelling Gastrointestinal:  ( - ) nausea, ( - ) heartburn, ( - )  change in bowel habits Skin: ( - ) abnormal skin rashes Lymphatics: ( - ) new lymphadenopathy, ( - ) easy bruising Neurological: ( - ) numbness, ( - ) tingling, ( - ) new weaknesses Behavioral/Psych: ( - ) mood change, ( - ) new changes  All other systems were reviewed with the patient and are negative.  PHYSICAL EXAMINATION:  Vitals:   02/16/23 1026  BP: (!) 125/91  Pulse: 91  Resp: 17  Temp: 97.9 F (36.6 C)  SpO2: 100%    Filed Weights   02/16/23 1026  Weight: 158 lb 4.8 oz (71.8 kg)     GENERAL: well appearing middle aged Caucasian female. alert, no distress and comfortable SKIN: skin color, texture, turgor are normal, no rashes or significant lesions EYES: conjunctiva are pink and non-injected, sclera clear LUNGS: clear to auscultation and percussion with normal breathing effort HEART: regular rate & rhythm and no murmurs and no lower extremity edema Musculoskeletal: no cyanosis of digits and no clubbing  PSYCH: alert & oriented x 3, fluent speech NEURO: no focal motor/sensory deficits  LABORATORY DATA:  I have reviewed the data as listed    Latest Ref Rng & Units 02/09/2023    9:36 AM 08/13/2022   11:04 AM 07/25/2022    5:43 AM  CBC  WBC 4.0 - 10.5 K/uL 4.0  6.2  5.7   Hemoglobin 12.0 - 15.0 g/dL 16.1  09.6  04.5   Hematocrit 36.0 - 46.0 % 39.9  44.7  41.5   Platelets 150 - 400 K/uL 263  269  242        Latest Ref Rng & Units 02/09/2023    9:36 AM 08/13/2022   11:04 AM 07/25/2022    5:43 AM  CMP  Glucose 70 - 99 mg/dL 409  811  914   BUN 6 - 20 mg/dL 17  10  11    Creatinine 0.44 - 1.00 mg/dL  0.90  0.78  1.01   Sodium 135 - 145 mmol/L 140  139  139   Potassium 3.5 - 5.1 mmol/L 3.9  3.7  3.8   Chloride 98 - 111 mmol/L 105  105  105   CO2 22 - 32 mmol/L 28  27  26    Calcium 8.9 - 10.3 mg/dL 9.5  9.6  9.1   Total Protein 6.5 - 8.1 g/dL 7.2  7.3  7.0   Total Bilirubin 0.3 - 1.2 mg/dL 0.6  0.5  0.4   Alkaline Phos 38 - 126 U/L 97  66  69   AST 15 - 41 U/L 14   12  15    ALT 0 - 44 U/L 36  9  12     RADIOGRAPHIC STUDIES: No results found.  ASSESSMENT & PLAN Ana Rose is a 47 y.o. female with medical history significant for vitamin b12 deficiency/normocytic anemia who presents for a follow up visit.  After review the labs, the records, discussion with the patient the findings are most consistent with improving anemia.  The patient responded modestly with IM vitamin B12 treatment, however she had a more robust response with IV iron therapy.  At this time her findings are most consistent with anemia secondary to her gastric bypass surgery.  Due to relatively mild deficiencies in these further work-up was pursued with a bone marrow biopsy.  Bone marrow biopsy was performed on 06/20/2018 and showed normocellular marrow with trilineage hematopoiesis with maturation.  The peripheral blood also only showed a normocytic anemia.  It is possible that the patient's mild appearing iron labs do truly underlay a genuine iron deficiency.    # Vitamin B12 deficiency  #Normocytic Anemia, improving # Iron Deficiency Anemia #s/p Gastric Bypass in 2010 --findings were most consistent with nutritional deficiencies 2/2 to her gastric bypass, however her hemoglobin has continued to drop despite vitamin B12 therapy --prior full nutritional evaluation with Vitamin b12 ,folate, MMA, homocysteine, copper, iron panel, and ferritin showed findings consistent with Vitamin B12 deficiency and mild iron deficiency --after extensive peripheral blood work no etiology has been found. A bone marrow biopsy also showed no clear etiology --Received IV feraheme 510 mg x 2 doses on 10/01/2020-10/08/2020 given the relatively low ferritin and the decreased iron stores within the bone marrow.  Hgb levels improved and eventually back to normal. Last received venofer 200 mg IV from 12/22-05/28/2022 x 3 doses  --Labs today show no evidence of anemia with Hgb 13.5, white blood cell count 4.0, MCV 88.7,  and platelets of 263 --RTC in 6 months for clinic visit with labs 1 week before.    No orders of the defined types were placed in this encounter.   All questions were answered. The patient knows to call the clinic with any problems, questions or concerns.  I have spent a total of 25 minutes minutes of face-to-face and non-face-to-face time, preparing to see the patient, performing a medically appropriate examination, counseling and educating the patient, ordering medications, documenting clinical information in the electronic health record,and care coordination.   Ulysees Barns, MD Department of Hematology/Oncology [facility] Phone: [phone] Pager: [phone] Email: [email or web address]  02/16/2023 2:20 PM

## 2023-02-17 DIAGNOSIS — F1014 Alcohol abuse with alcohol-induced mood disorder: Secondary | ICD-10-CM | POA: Diagnosis not present

## 2023-02-21 DIAGNOSIS — F411 Generalized anxiety disorder: Secondary | ICD-10-CM | POA: Diagnosis not present

## 2023-02-21 DIAGNOSIS — F4310 Post-traumatic stress disorder, unspecified: Secondary | ICD-10-CM | POA: Diagnosis not present

## 2023-02-21 DIAGNOSIS — F1021 Alcohol dependence, in remission: Secondary | ICD-10-CM | POA: Diagnosis not present

## 2023-02-22 DIAGNOSIS — Z1231 Encounter for screening mammogram for malignant neoplasm of breast: Secondary | ICD-10-CM | POA: Diagnosis not present

## 2023-03-03 DIAGNOSIS — F1014 Alcohol abuse with alcohol-induced mood disorder: Secondary | ICD-10-CM | POA: Diagnosis not present

## 2023-03-08 DIAGNOSIS — F411 Generalized anxiety disorder: Secondary | ICD-10-CM | POA: Diagnosis not present

## 2023-03-08 DIAGNOSIS — F1021 Alcohol dependence, in remission: Secondary | ICD-10-CM | POA: Diagnosis not present

## 2023-03-08 DIAGNOSIS — F4310 Post-traumatic stress disorder, unspecified: Secondary | ICD-10-CM | POA: Diagnosis not present

## 2023-03-17 DIAGNOSIS — F1014 Alcohol abuse with alcohol-induced mood disorder: Secondary | ICD-10-CM | POA: Diagnosis not present

## 2023-03-21 DIAGNOSIS — F411 Generalized anxiety disorder: Secondary | ICD-10-CM | POA: Diagnosis not present

## 2023-03-21 DIAGNOSIS — F1021 Alcohol dependence, in remission: Secondary | ICD-10-CM | POA: Diagnosis not present

## 2023-03-21 DIAGNOSIS — F4310 Post-traumatic stress disorder, unspecified: Secondary | ICD-10-CM | POA: Diagnosis not present

## 2023-03-31 DIAGNOSIS — F1014 Alcohol abuse with alcohol-induced mood disorder: Secondary | ICD-10-CM | POA: Diagnosis not present

## 2023-04-01 DIAGNOSIS — F1090 Alcohol use, unspecified, uncomplicated: Secondary | ICD-10-CM | POA: Diagnosis not present

## 2023-04-07 DIAGNOSIS — F411 Generalized anxiety disorder: Secondary | ICD-10-CM | POA: Diagnosis not present

## 2023-04-07 DIAGNOSIS — F1021 Alcohol dependence, in remission: Secondary | ICD-10-CM | POA: Diagnosis not present

## 2023-04-07 DIAGNOSIS — F4310 Post-traumatic stress disorder, unspecified: Secondary | ICD-10-CM | POA: Diagnosis not present

## 2023-04-14 DIAGNOSIS — F1014 Alcohol abuse with alcohol-induced mood disorder: Secondary | ICD-10-CM | POA: Diagnosis not present

## 2023-04-19 DIAGNOSIS — F411 Generalized anxiety disorder: Secondary | ICD-10-CM | POA: Diagnosis not present

## 2023-04-19 DIAGNOSIS — F1021 Alcohol dependence, in remission: Secondary | ICD-10-CM | POA: Diagnosis not present

## 2023-04-19 DIAGNOSIS — F4310 Post-traumatic stress disorder, unspecified: Secondary | ICD-10-CM | POA: Diagnosis not present

## 2023-04-28 DIAGNOSIS — F1014 Alcohol abuse with alcohol-induced mood disorder: Secondary | ICD-10-CM | POA: Diagnosis not present

## 2023-05-03 DIAGNOSIS — F411 Generalized anxiety disorder: Secondary | ICD-10-CM | POA: Diagnosis not present

## 2023-05-03 DIAGNOSIS — F4310 Post-traumatic stress disorder, unspecified: Secondary | ICD-10-CM | POA: Diagnosis not present

## 2023-05-03 DIAGNOSIS — F1021 Alcohol dependence, in remission: Secondary | ICD-10-CM | POA: Diagnosis not present

## 2023-05-06 DIAGNOSIS — F1090 Alcohol use, unspecified, uncomplicated: Secondary | ICD-10-CM | POA: Diagnosis not present

## 2023-05-12 DIAGNOSIS — F1014 Alcohol abuse with alcohol-induced mood disorder: Secondary | ICD-10-CM | POA: Diagnosis not present

## 2023-05-23 DIAGNOSIS — F1014 Alcohol abuse with alcohol-induced mood disorder: Secondary | ICD-10-CM | POA: Diagnosis not present

## 2023-06-02 DIAGNOSIS — F1014 Alcohol abuse with alcohol-induced mood disorder: Secondary | ICD-10-CM | POA: Diagnosis not present

## 2023-06-06 DIAGNOSIS — F1090 Alcohol use, unspecified, uncomplicated: Secondary | ICD-10-CM | POA: Diagnosis not present

## 2023-06-09 DIAGNOSIS — F1014 Alcohol abuse with alcohol-induced mood disorder: Secondary | ICD-10-CM | POA: Diagnosis not present

## 2023-06-23 DIAGNOSIS — F1014 Alcohol abuse with alcohol-induced mood disorder: Secondary | ICD-10-CM | POA: Diagnosis not present

## 2023-07-11 DIAGNOSIS — F1090 Alcohol use, unspecified, uncomplicated: Secondary | ICD-10-CM | POA: Diagnosis not present

## 2023-07-12 DIAGNOSIS — F1014 Alcohol abuse with alcohol-induced mood disorder: Secondary | ICD-10-CM | POA: Diagnosis not present

## 2023-07-21 DIAGNOSIS — F1014 Alcohol abuse with alcohol-induced mood disorder: Secondary | ICD-10-CM | POA: Diagnosis not present

## 2023-08-02 ENCOUNTER — Telehealth: Payer: Self-pay | Admitting: Hematology and Oncology

## 2023-08-04 DIAGNOSIS — F1014 Alcohol abuse with alcohol-induced mood disorder: Secondary | ICD-10-CM | POA: Diagnosis not present

## 2023-08-05 ENCOUNTER — Inpatient Hospital Stay: Payer: Self-pay

## 2023-08-05 ENCOUNTER — Other Ambulatory Visit: Payer: Self-pay | Admitting: Hematology and Oncology

## 2023-08-05 DIAGNOSIS — D508 Other iron deficiency anemias: Secondary | ICD-10-CM

## 2023-08-05 DIAGNOSIS — F1090 Alcohol use, unspecified, uncomplicated: Secondary | ICD-10-CM | POA: Diagnosis not present

## 2023-08-05 DIAGNOSIS — E538 Deficiency of other specified B group vitamins: Secondary | ICD-10-CM

## 2023-08-08 ENCOUNTER — Telehealth: Payer: Self-pay | Admitting: Hematology and Oncology

## 2023-08-09 ENCOUNTER — Other Ambulatory Visit: Payer: 59

## 2023-08-11 ENCOUNTER — Inpatient Hospital Stay: Attending: Hematology and Oncology

## 2023-08-11 DIAGNOSIS — F17290 Nicotine dependence, other tobacco product, uncomplicated: Secondary | ICD-10-CM | POA: Diagnosis not present

## 2023-08-11 DIAGNOSIS — D508 Other iron deficiency anemias: Secondary | ICD-10-CM

## 2023-08-11 DIAGNOSIS — D509 Iron deficiency anemia, unspecified: Secondary | ICD-10-CM | POA: Insufficient documentation

## 2023-08-11 DIAGNOSIS — Z803 Family history of malignant neoplasm of breast: Secondary | ICD-10-CM | POA: Diagnosis not present

## 2023-08-11 DIAGNOSIS — Z9884 Bariatric surgery status: Secondary | ICD-10-CM | POA: Diagnosis not present

## 2023-08-11 DIAGNOSIS — E538 Deficiency of other specified B group vitamins: Secondary | ICD-10-CM | POA: Diagnosis not present

## 2023-08-11 DIAGNOSIS — Z800 Family history of malignant neoplasm of digestive organs: Secondary | ICD-10-CM | POA: Diagnosis not present

## 2023-08-11 LAB — IRON AND IRON BINDING CAPACITY (CC-WL,HP ONLY)
Iron: 116 ug/dL (ref 28–170)
Saturation Ratios: 30 % (ref 10.4–31.8)
TIBC: 386 ug/dL (ref 250–450)
UIBC: 270 ug/dL (ref 148–442)

## 2023-08-11 LAB — FERRITIN: Ferritin: 32 ng/mL (ref 11–307)

## 2023-08-11 LAB — RETIC PANEL
Immature Retic Fract: 6.6 % (ref 2.3–15.9)
RBC.: 4.69 MIL/uL (ref 3.87–5.11)
Retic Count, Absolute: 70.8 10*3/uL (ref 19.0–186.0)
Retic Ct Pct: 1.5 % (ref 0.4–3.1)
Reticulocyte Hemoglobin: 31.9 pg (ref >27.9)

## 2023-08-11 LAB — CBC WITH DIFFERENTIAL (CANCER CENTER ONLY)
Abs Immature Granulocytes: 0.01 10*3/uL (ref 0.00–0.07)
Basophils Absolute: 0 10*3/uL (ref 0.0–0.1)
Basophils Relative: 1 %
Eosinophils Absolute: 0.1 10*3/uL (ref 0.0–0.5)
Eosinophils Relative: 2 %
HCT: 40 % (ref 36.0–46.0)
Hemoglobin: 13.1 g/dL (ref 12.0–15.0)
Immature Granulocytes: 0 %
Lymphocytes Relative: 48 %
Lymphs Abs: 2.3 10*3/uL (ref 0.7–4.0)
MCH: 28.3 pg (ref 26.0–34.0)
MCHC: 32.8 g/dL (ref 30.0–36.0)
MCV: 86.4 fL (ref 80.0–100.0)
Monocytes Absolute: 0.3 10*3/uL (ref 0.1–1.0)
Monocytes Relative: 7 %
Neutro Abs: 2 10*3/uL (ref 1.7–7.7)
Neutrophils Relative %: 42 %
Platelet Count: 299 10*3/uL (ref 150–400)
RBC: 4.63 MIL/uL (ref 3.87–5.11)
RDW: 13.4 % (ref 11.5–15.5)
WBC Count: 4.8 10*3/uL (ref 4.0–10.5)
nRBC: 0 % (ref 0.0–0.2)

## 2023-08-11 LAB — CMP (CANCER CENTER ONLY)
ALT: 22 U/L (ref 0–44)
AST: 14 U/L — ABNORMAL LOW (ref 15–41)
Albumin: 4.3 g/dL (ref 3.5–5.0)
Alkaline Phosphatase: 118 U/L (ref 38–126)
Anion gap: 5 (ref 5–15)
BUN: 13 mg/dL (ref 6–20)
CO2: 30 mmol/L (ref 22–32)
Calcium: 9.5 mg/dL (ref 8.9–10.3)
Chloride: 105 mmol/L (ref 98–111)
Creatinine: 0.69 mg/dL (ref 0.44–1.00)
GFR, Estimated: >60 mL/min (ref >60)
Glucose, Bld: 94 mg/dL (ref 70–99)
Potassium: 4.1 mmol/L (ref 3.5–5.1)
Sodium: 140 mmol/L (ref 135–145)
Total Bilirubin: 0.4 mg/dL (ref 0.0–1.2)
Total Protein: 7 g/dL (ref 6.5–8.1)

## 2023-08-11 LAB — VITAMIN B12: Vitamin B-12: 165 pg/mL — ABNORMAL LOW (ref 180–914)

## 2023-08-17 ENCOUNTER — Inpatient Hospital Stay: Payer: 59 | Admitting: Hematology and Oncology

## 2023-08-17 ENCOUNTER — Telehealth: Payer: Self-pay | Admitting: Hematology and Oncology

## 2023-08-17 ENCOUNTER — Encounter: Payer: Self-pay | Admitting: Physician Assistant

## 2023-08-17 VITALS — BP 140/99 | HR 98 | Temp 98.0°F | Resp 16 | Wt 186.7 lb

## 2023-08-17 DIAGNOSIS — Z9884 Bariatric surgery status: Secondary | ICD-10-CM | POA: Diagnosis not present

## 2023-08-17 DIAGNOSIS — E538 Deficiency of other specified B group vitamins: Secondary | ICD-10-CM

## 2023-08-17 DIAGNOSIS — Z800 Family history of malignant neoplasm of digestive organs: Secondary | ICD-10-CM | POA: Diagnosis not present

## 2023-08-17 DIAGNOSIS — D508 Other iron deficiency anemias: Secondary | ICD-10-CM

## 2023-08-17 DIAGNOSIS — Z803 Family history of malignant neoplasm of breast: Secondary | ICD-10-CM | POA: Diagnosis not present

## 2023-08-17 DIAGNOSIS — D649 Anemia, unspecified: Secondary | ICD-10-CM | POA: Diagnosis not present

## 2023-08-17 DIAGNOSIS — D509 Iron deficiency anemia, unspecified: Secondary | ICD-10-CM | POA: Diagnosis not present

## 2023-08-17 DIAGNOSIS — F17290 Nicotine dependence, other tobacco product, uncomplicated: Secondary | ICD-10-CM | POA: Diagnosis not present

## 2023-08-17 NOTE — Telephone Encounter (Signed)
 Scheduled appointments per 3/26 LOS notes. Left the patient a voicemail with the appointment details.

## 2023-08-17 NOTE — Progress Notes (Signed)
 [facility] [phone]   [phone]  PROGRESS NOTE  Patient Care Team: Clayborn Heron, MD as PCP - General (Family Medicine)  Hematological/Oncological History # Normocytic Anemia 1) 11/17/2018: Iron 39, Ferritin 8, Sat 10%, TIBC 408  2) 11/14/2019: Vitamin b12 109, Folate 13.2,  3) 12/03/2019-12/17/2019: received weekly injections of 1000 mcg of Vitamin B12. An additional dose was given on 01/21/2020 4) 01/21/2020: WBC 4.3, Hgb 9.2, MCV 82.6, Plt 350. Vitamin B12 130 (nml 180-914)   5) 02/20/2020: establish care with Dr. Leonides Schanz. WBC 6.9, Hgb 10.0, MCV 83.3, Plt 340 6) 04/25/2020: WBC 4.1, Hgb 9.2, MCV 92.2, Plt 253  7) 5/11-5/18/2022: 2 doses of IV Feraheme 8) 02/20/2021: WBC 3.1, Hgb 12.1, MCV 98.9, Plt 239 9) 08/21/2021: WBC 3.6, Hgb 12.0, Mcv 92.8, Plt 238  Interval History:  Ana Rose 48 y.o. female with medical history significant for vitamin b12 deficiency who presents for a follow up visit. The patient's last visit was on 02/16/2023.  On exam today Ana Rose reports that she has had no major changes in her health interim since her last visit other than some weight gain.  She weighs 186 pounds today and reports she would like to be closer to 150.  She reports she has been eating "too well".  She has had no changes in her diet but just increased the portions.  She reports she has had no recent viral illnesses such as flu, coronavirus, or norovirus.  She reports her energy levels have been good, currently 8 out of 10.  She reports has had no trouble with bleeding, bruising, or dark stools.  She has had no blood in the urine or stool.  She reports that she did recently have a crown on her tooth replaced.  She reports that otherwise she has been at her baseline level of health.  She continues taking her IM vitamin B12 monthly at home.  She denies fevers, chills, sweats, shortness of breath, chest pain, nausea or vomiting.She has no other complaints. A  full 10 point ROS is listed below.  MEDICAL HISTORY:  Past Medical History:  Diagnosis Date   ADD (attention deficit disorder)    Alcoholism (HCC)    Anemia    Anxiety    Bipolar disorder (HCC)    Blood transfusion without reported diagnosis    CIN I (cervical intraepithelial neoplasia I)    Congestive heart failure (CHF) (HCC)    Depression    Diabetes (HCC)    Epileptic seizures (HCC)    Last seizure at 48 years of age   GERD (gastroesophageal reflux disease)    Heart murmur    funtional   Hiatal hernia    History of dysfunctional uterine bleeding    Hypercholesteremia 08/31/2016   Noted in note   Hypothyroidism    Irritable bowel syndrome (IBS)    Obesity    PONV (postoperative nausea and vomiting)    Suicide attempt (HCC) 08/24/2012    SURGICAL HISTORY: Past Surgical History:  Procedure Laterality Date   CHOLECYSTECTOMY  2001   COLONOSCOPY  05/27/2017   CYST EXCISION Right 10/24/2018   Procedure: Excision of right inner thigh sebaceous cyst;  Surgeon: Peggye Form, DO;  Location: [facility];  Service: Plastics;  Laterality: Right;   ESOPHAGOGASTRODUODENOSCOPY  05/2012   GASTRIC BYPASS  2010   GYNECOLOGIC CRYOSURGERY  Approximately age 2   LAPAROSCOPIC TOTAL HYSTERECTOMY  12/07/2010   endometriosis on fallopian tubes   LAPAROSCOPIC UNILATERAL  SALPINGECTOMY Right 10/30/2018   Procedure: LAPAROSCOPIC UNILATERAL SALPINGECTOMY;  Surgeon: Dara Lords, MD;  Location: [facility];  Service: Gynecology;  Laterality: Right;   LAPAROSCOPIC UNILATERAL SALPINGO OOPHERECTOMY Left 10/30/2018   Procedure: LAPAROSCOPIC UNILATERAL SALPINGO OOPHORECTOMY;  Surgeon: Dara Lords, MD;  Location: [facility];  Service: Gynecology;  Laterality: Left;  request 7:30am OR time in [place] [facility] block requests one hour   TUBAL LIGATION  2001   VAGINAL HYSTERECTOMY     WISDOM TOOTH EXTRACTION      SOCIAL HISTORY: Social  History   Socioeconomic History   Marital status: Married    Spouse name: Not on file   Number of children: 1   Years of education: Not on file   Highest education level: Not on file  Occupational History   Occupation: insurance agent  Tobacco Use   Smoking status: Former    Current packs/day: 0.00    Types: Cigarettes    Quit date: 11/30/1998    Years since quitting: 24.7   Smokeless tobacco: Never  Vaping Use   Vaping status: Some Days  Substance and Sexual Activity   Alcohol use: Not Currently    Alcohol/week: 0.0 standard drinks of alcohol   Drug use: No   Sexual activity: Yes    Partners: Female    Birth control/protection: Surgical    Comment: HYST.-1st intercourse 48 yo-Fewer than 5 partners  Other Topics Concern   Not on file  Social History Narrative   Not on file   Social Drivers of Health   Financial Resource Strain: Not on file  Food Insecurity: Not on file  Transportation Needs: Not on file  Physical Activity: Not on file  Stress: Not on file  Social Connections: Unknown (09/30/2021)   Received from [facility], [facility]   Social Network    Social Network: Not on file  Intimate Partner Violence: Unknown (08/26/2021)   Received from [employer], [facility]   HITS    Physically Hurt: Not on file    Insult or Talk Down To: Not on file    Threaten Physical Harm: Not on file    Scream or Curse: Not on file    FAMILY HISTORY: Family History  Problem Relation Age of Onset   Hypertension Mother    Colon polyps Mother    Irritable bowel syndrome Mother    Hypertension Father    Colon cancer Maternal Grandmother    Crohn's disease Maternal Grandfather    Diabetes Paternal Grandmother    Cancer Paternal Grandmother        OV/UT   Breast cancer Paternal Grandmother 33   Alcoholism Paternal Grandfather    Breast cancer Cousin 59       MATERNAL   Stomach cancer Neg Hx    Esophageal cancer Neg Hx    Rectal cancer Neg Hx     ALLERGIES:  is  allergic to hydrocodone, lactose intolerance (gi), nsaids, and xanax [alprazolam].  MEDICATIONS:  Current Outpatient Medications  Medication Sig Dispense Refill   acetaminophen (TYLENOL) 325 MG tablet as needed.     buPROPion (WELLBUTRIN XL) 300 MG 24 hr tablet 1 tablet in the morning Orally Once a day for 90 days     cyanocobalamin (,VITAMIN B-12,) 1000 MCG/ML injection every 30 (thirty) days.     diphenoxylate-atropine (LOMOTIL) 2.5-0.025 MG tablet Take 1 tablet by mouth 4 (four) times daily as needed for diarrhea or loose stools. (Patient not taking: Reported on 08/13/2022)  60 tablet 1   folic acid (FOLVITE) 1 MG tablet Take 1 mg by mouth daily.     hydrOXYzine (VISTARIL) 50 MG capsule Take by mouth. (Patient not taking: Reported on 08/13/2022)     melatonin 3 MG TABS tablet      Multiple Vitamins-Minerals (ONCOVITE) TABS Take 1 tablet by mouth daily.     ondansetron (ZOFRAN) 4 MG tablet Take 4 mg by mouth daily. (Patient not taking: Reported on 08/13/2022)     topiramate (TOPAMAX) 50 MG tablet Take 100 mg by mouth at bedtime.     traZODone (DESYREL) 50 MG tablet Take 300 mg by mouth at bedtime.     Vitamin D, Ergocalciferol, (DRISDOL) 1.25 MG (50000 UNIT) CAPS capsule Take 1 capsule (50,000 Units total) by mouth every 7 (seven) days. 12 capsule 3   Current Facility-Administered Medications  Medication Dose Route Frequency Provider Last Rate Last Admin   0.9 %  sodium chloride infusion  500 mL Intravenous Once Danis, Starr Lake III, MD        REVIEW OF SYSTEMS:   Constitutional: ( - ) fevers, ( - )  chills , ( - ) night sweats Eyes: ( - ) blurriness of vision, ( - ) double vision, ( - ) watery eyes Ears, nose, mouth, throat, and face: ( - ) mucositis, ( - ) sore throat Respiratory: ( - ) cough, ( - ) dyspnea, ( - ) wheezes Cardiovascular: ( - ) palpitation, ( - ) chest discomfort, ( - ) lower extremity swelling Gastrointestinal:  ( - ) nausea, ( - ) heartburn, ( - ) change in bowel  habits Skin: ( - ) abnormal skin rashes Lymphatics: ( - ) new lymphadenopathy, ( - ) easy bruising Neurological: ( - ) numbness, ( - ) tingling, ( - ) new weaknesses Behavioral/Psych: ( - ) mood change, ( - ) new changes  All other systems were reviewed with the patient and are negative.  PHYSICAL EXAMINATION:  Vitals:   08/17/23 1047  BP: (!) 140/99  Pulse: 98  Resp: 16  Temp: 98 F (36.7 C)  SpO2: 100%     Filed Weights   08/17/23 1047  Weight: 186 lb 11.2 oz (84.7 kg)      GENERAL: well appearing middle aged Caucasian female. alert, no distress and comfortable SKIN: skin color, texture, turgor are normal, no rashes or significant lesions EYES: conjunctiva are pink and non-injected, sclera clear LUNGS: clear to auscultation and percussion with normal breathing effort HEART: regular rate & rhythm and no murmurs and no lower extremity edema Musculoskeletal: no cyanosis of digits and no clubbing  PSYCH: alert & oriented x 3, fluent speech NEURO: no focal motor/sensory deficits  LABORATORY DATA:  I have reviewed the data as listed    Latest Ref Rng & Units 08/11/2023   12:28 PM 02/09/2023    9:36 AM 08/13/2022   11:04 AM  CBC  WBC 4.0 - 10.5 K/uL 4.8  4.0  6.2   Hemoglobin 12.0 - 15.0 g/dL 21.3  08.6  57.8   Hematocrit 36.0 - 46.0 % 40.0  39.9  44.7   Platelets 150 - 400 K/uL 299  263  269        Latest Ref Rng & Units 08/11/2023   12:28 PM 02/09/2023    9:36 AM 08/13/2022   11:04 AM  CMP  Glucose 70 - 99 mg/dL 94  469  629   BUN 6 - 20 mg/dL 13  17  10   Creatinine 0.44 - 1.00 mg/dL 1.61  0.96  0.45   Sodium 135 - 145 mmol/L 140  140  139   Potassium 3.5 - 5.1 mmol/L 4.1  3.9  3.7   Chloride 98 - 111 mmol/L 105  105  105   CO2 22 - 32 mmol/L 30  28  27    Calcium 8.9 - 10.3 mg/dL 9.5  9.5  9.6   Total Protein 6.5 - 8.1 g/dL 7.0  7.2  7.3   Total Bilirubin 0.0 - 1.2 mg/dL 0.4  0.6  0.5   Alkaline Phos 38 - 126 U/L 118  97  66   AST 15 - 41 U/L 14  14  12     ALT 0 - 44 U/L 22  36  9     RADIOGRAPHIC STUDIES: No results found.  ASSESSMENT & PLAN LAHNA Rose is a 48 y.o. female with medical history significant for vitamin b12 deficiency/normocytic anemia who presents for a follow up visit.  After review the labs, the records, discussion with the patient the findings are most consistent with improving anemia.  The patient responded modestly with IM vitamin B12 treatment, however she had a more robust response with IV iron therapy.  At this time her findings are most consistent with anemia secondary to her gastric bypass surgery.  Due to relatively mild deficiencies in these further work-up was pursued with a bone marrow biopsy.  Bone marrow biopsy was performed on 06/20/2018 and showed normocellular marrow with trilineage hematopoiesis with maturation.  The peripheral blood also only showed a normocytic anemia.  It is possible that the patient's mild appearing iron labs do truly underlay a genuine iron deficiency.    # Vitamin B12 deficiency  #Normocytic Anemia, improving # Iron Deficiency Anemia #s/p Gastric Bypass in 2010 --findings were most consistent with nutritional deficiencies 2/2 to her gastric bypass, however her hemoglobin has continued to drop despite vitamin B12 therapy --prior full nutritional evaluation with Vitamin b12 ,folate, MMA, homocysteine, copper, iron panel, and ferritin showed findings consistent with Vitamin B12 deficiency and mild iron deficiency --after extensive peripheral blood work no etiology has been found. A bone marrow biopsy also showed no clear etiology --Received IV feraheme 510 mg x 2 doses on 10/01/2020-10/08/2020 given the relatively low ferritin and the decreased iron stores within the bone marrow.  Hgb levels improved and eventually back to normal. Last received venofer 200 mg IV from 12/22-05/28/2022 x 3 doses  --Labs today show no evidence of anemia with Hgb 13.1, WBC 4.8, MCV 86.4, Plt 299. --vitamin b12  levels at 165, currently taking injections q 4 weeks. Recommend increasing to every other week.  --RTC in 6 months for clinic visit with labs 1 week before.    No orders of the defined types were placed in this encounter.   All questions were answered. The patient knows to call the clinic with any problems, questions or concerns.  I have spent a total of 25 minutes minutes of face-to-face and non-face-to-face time, preparing to see the patient, performing a medically appropriate examination, counseling and educating the patient, ordering medications, documenting clinical information in the electronic health record,and care coordination.   Ulysees Barns, MD Department of Hematology/Oncology [facility] at [facility] Phone: [phone] Pager: [phone] Email: [email or web address]  08/17/2023 11:47 AM

## 2023-08-18 DIAGNOSIS — D7589 Other specified diseases of blood and blood-forming organs: Secondary | ICD-10-CM | POA: Diagnosis not present

## 2023-08-18 DIAGNOSIS — E782 Mixed hyperlipidemia: Secondary | ICD-10-CM | POA: Diagnosis not present

## 2023-08-18 DIAGNOSIS — F1014 Alcohol abuse with alcohol-induced mood disorder: Secondary | ICD-10-CM | POA: Diagnosis not present

## 2023-08-18 DIAGNOSIS — F1090 Alcohol use, unspecified, uncomplicated: Secondary | ICD-10-CM | POA: Diagnosis not present

## 2023-08-18 LAB — METHYLMALONIC ACID, SERUM: Methylmalonic Acid, Quantitative: 466 nmol/L — ABNORMAL HIGH (ref 0–378)

## 2023-08-30 DIAGNOSIS — F1014 Alcohol abuse with alcohol-induced mood disorder: Secondary | ICD-10-CM | POA: Diagnosis not present

## 2023-09-02 DIAGNOSIS — F411 Generalized anxiety disorder: Secondary | ICD-10-CM | POA: Diagnosis not present

## 2023-09-02 DIAGNOSIS — F4310 Post-traumatic stress disorder, unspecified: Secondary | ICD-10-CM | POA: Diagnosis not present

## 2023-09-02 DIAGNOSIS — F1021 Alcohol dependence, in remission: Secondary | ICD-10-CM | POA: Diagnosis not present

## 2023-09-05 DIAGNOSIS — F1090 Alcohol use, unspecified, uncomplicated: Secondary | ICD-10-CM | POA: Diagnosis not present

## 2023-09-05 DIAGNOSIS — E538 Deficiency of other specified B group vitamins: Secondary | ICD-10-CM | POA: Diagnosis not present

## 2023-09-15 DIAGNOSIS — F1014 Alcohol abuse with alcohol-induced mood disorder: Secondary | ICD-10-CM | POA: Diagnosis not present

## 2023-09-27 DIAGNOSIS — F4310 Post-traumatic stress disorder, unspecified: Secondary | ICD-10-CM | POA: Diagnosis not present

## 2023-09-27 DIAGNOSIS — F1021 Alcohol dependence, in remission: Secondary | ICD-10-CM | POA: Diagnosis not present

## 2023-09-27 DIAGNOSIS — F411 Generalized anxiety disorder: Secondary | ICD-10-CM | POA: Diagnosis not present

## 2023-10-05 DIAGNOSIS — F1090 Alcohol use, unspecified, uncomplicated: Secondary | ICD-10-CM | POA: Diagnosis not present

## 2023-10-05 DIAGNOSIS — E538 Deficiency of other specified B group vitamins: Secondary | ICD-10-CM | POA: Diagnosis not present

## 2023-10-13 DIAGNOSIS — F1014 Alcohol abuse with alcohol-induced mood disorder: Secondary | ICD-10-CM | POA: Diagnosis not present

## 2023-10-25 DIAGNOSIS — F4310 Post-traumatic stress disorder, unspecified: Secondary | ICD-10-CM | POA: Diagnosis not present

## 2023-10-25 DIAGNOSIS — F411 Generalized anxiety disorder: Secondary | ICD-10-CM | POA: Diagnosis not present

## 2023-10-25 DIAGNOSIS — F1021 Alcohol dependence, in remission: Secondary | ICD-10-CM | POA: Diagnosis not present

## 2023-11-03 DIAGNOSIS — E538 Deficiency of other specified B group vitamins: Secondary | ICD-10-CM | POA: Diagnosis not present

## 2023-11-07 DIAGNOSIS — F1090 Alcohol use, unspecified, uncomplicated: Secondary | ICD-10-CM | POA: Diagnosis not present

## 2023-11-07 DIAGNOSIS — R600 Localized edema: Secondary | ICD-10-CM | POA: Diagnosis not present

## 2023-11-30 DIAGNOSIS — F4310 Post-traumatic stress disorder, unspecified: Secondary | ICD-10-CM | POA: Diagnosis not present

## 2023-11-30 DIAGNOSIS — F1021 Alcohol dependence, in remission: Secondary | ICD-10-CM | POA: Diagnosis not present

## 2023-11-30 DIAGNOSIS — F411 Generalized anxiety disorder: Secondary | ICD-10-CM | POA: Diagnosis not present

## 2023-12-05 DIAGNOSIS — E538 Deficiency of other specified B group vitamins: Secondary | ICD-10-CM | POA: Diagnosis not present

## 2023-12-06 DIAGNOSIS — F1090 Alcohol use, unspecified, uncomplicated: Secondary | ICD-10-CM | POA: Diagnosis not present

## 2023-12-08 DIAGNOSIS — F1014 Alcohol abuse with alcohol-induced mood disorder: Secondary | ICD-10-CM | POA: Diagnosis not present

## 2023-12-22 DIAGNOSIS — F1014 Alcohol abuse with alcohol-induced mood disorder: Secondary | ICD-10-CM | POA: Diagnosis not present

## 2023-12-28 DIAGNOSIS — F1021 Alcohol dependence, in remission: Secondary | ICD-10-CM | POA: Diagnosis not present

## 2023-12-28 DIAGNOSIS — F4310 Post-traumatic stress disorder, unspecified: Secondary | ICD-10-CM | POA: Diagnosis not present

## 2023-12-28 DIAGNOSIS — F411 Generalized anxiety disorder: Secondary | ICD-10-CM | POA: Diagnosis not present

## 2023-12-30 ENCOUNTER — Other Ambulatory Visit: Payer: Self-pay | Admitting: Medical Genetics

## 2024-01-05 DIAGNOSIS — F1014 Alcohol abuse with alcohol-induced mood disorder: Secondary | ICD-10-CM | POA: Diagnosis not present

## 2024-01-06 DIAGNOSIS — E538 Deficiency of other specified B group vitamins: Secondary | ICD-10-CM | POA: Diagnosis not present

## 2024-01-06 DIAGNOSIS — F1090 Alcohol use, unspecified, uncomplicated: Secondary | ICD-10-CM | POA: Diagnosis not present

## 2024-02-02 DIAGNOSIS — F1014 Alcohol abuse with alcohol-induced mood disorder: Secondary | ICD-10-CM | POA: Diagnosis not present

## 2024-02-03 ENCOUNTER — Inpatient Hospital Stay: Attending: Hematology and Oncology

## 2024-02-03 ENCOUNTER — Other Ambulatory Visit: Payer: Self-pay | Admitting: Hematology and Oncology

## 2024-02-03 DIAGNOSIS — E538 Deficiency of other specified B group vitamins: Secondary | ICD-10-CM | POA: Insufficient documentation

## 2024-02-03 DIAGNOSIS — D508 Other iron deficiency anemias: Secondary | ICD-10-CM

## 2024-02-03 DIAGNOSIS — D649 Anemia, unspecified: Secondary | ICD-10-CM | POA: Insufficient documentation

## 2024-02-03 LAB — VITAMIN B12: Vitamin B-12: 686 pg/mL (ref 180–914)

## 2024-02-03 LAB — CBC WITH DIFFERENTIAL (CANCER CENTER ONLY)
Abs Immature Granulocytes: 0 K/uL (ref 0.00–0.07)
Basophils Absolute: 0.1 K/uL (ref 0.0–0.1)
Basophils Relative: 1 %
Eosinophils Absolute: 0.1 K/uL (ref 0.0–0.5)
Eosinophils Relative: 2 %
HCT: 41.1 % (ref 36.0–46.0)
Hemoglobin: 13.8 g/dL (ref 12.0–15.0)
Immature Granulocytes: 0 %
Lymphocytes Relative: 52 %
Lymphs Abs: 2.1 K/uL (ref 0.7–4.0)
MCH: 28.4 pg (ref 26.0–34.0)
MCHC: 33.6 g/dL (ref 30.0–36.0)
MCV: 84.6 fL (ref 80.0–100.0)
Monocytes Absolute: 0.4 K/uL (ref 0.1–1.0)
Monocytes Relative: 9 %
Neutro Abs: 1.5 K/uL — ABNORMAL LOW (ref 1.7–7.7)
Neutrophils Relative %: 36 %
Platelet Count: 289 K/uL (ref 150–400)
RBC: 4.86 MIL/uL (ref 3.87–5.11)
RDW: 14.1 % (ref 11.5–15.5)
WBC Count: 4 K/uL (ref 4.0–10.5)
nRBC: 0 % (ref 0.0–0.2)

## 2024-02-03 LAB — CMP (CANCER CENTER ONLY)
ALT: 11 U/L (ref 0–44)
AST: 15 U/L (ref 15–41)
Albumin: 4.5 g/dL (ref 3.5–5.0)
Alkaline Phosphatase: 99 U/L (ref 38–126)
Anion gap: 12 (ref 5–15)
BUN: 8 mg/dL (ref 6–20)
CO2: 20 mmol/L — ABNORMAL LOW (ref 22–32)
Calcium: 9.3 mg/dL (ref 8.9–10.3)
Chloride: 106 mmol/L (ref 98–111)
Creatinine: 0.96 mg/dL (ref 0.44–1.00)
GFR, Estimated: >60 mL/min (ref >60)
Glucose, Bld: 90 mg/dL (ref 70–99)
Potassium: 3.8 mmol/L (ref 3.5–5.1)
Sodium: 138 mmol/L (ref 135–145)
Total Bilirubin: 0.6 mg/dL (ref 0.0–1.2)
Total Protein: 7 g/dL (ref 6.5–8.1)

## 2024-02-03 LAB — IRON AND IRON BINDING CAPACITY (CC-WL,HP ONLY)
Iron: 91 ug/dL (ref 28–170)
Saturation Ratios: 37 % — ABNORMAL HIGH (ref 10.4–31.8)
TIBC: 244 ug/dL — ABNORMAL LOW (ref 250–450)
UIBC: 153 ug/dL (ref 148–442)

## 2024-02-03 LAB — FERRITIN: Ferritin: 152 ng/mL (ref 11–307)

## 2024-02-07 DIAGNOSIS — F1090 Alcohol use, unspecified, uncomplicated: Secondary | ICD-10-CM | POA: Diagnosis not present

## 2024-02-08 DIAGNOSIS — F411 Generalized anxiety disorder: Secondary | ICD-10-CM | POA: Diagnosis not present

## 2024-02-08 DIAGNOSIS — F1021 Alcohol dependence, in remission: Secondary | ICD-10-CM | POA: Diagnosis not present

## 2024-02-08 DIAGNOSIS — F4310 Post-traumatic stress disorder, unspecified: Secondary | ICD-10-CM | POA: Diagnosis not present

## 2024-02-09 ENCOUNTER — Telehealth: Payer: Self-pay | Admitting: *Deleted

## 2024-02-09 LAB — METHYLMALONIC ACID, SERUM: Methylmalonic Acid, Quantitative: 93 nmol/L (ref 0–378)

## 2024-02-09 NOTE — Telephone Encounter (Signed)
 TCT patient regarding her appt for tomorrow.  No answer but was able to leave vm message for to call back today @ [phone].  Calling to cancel pt's appts for tomorrow as Dr. Federico will not be in the office. She is to continue her B12 injections every 2 weeks. She had labs done on 02/03/24 and has no need for IV iron . We will re-schedule her for 6 months f/u

## 2024-02-10 ENCOUNTER — Other Ambulatory Visit

## 2024-02-10 ENCOUNTER — Inpatient Hospital Stay: Admitting: Hematology and Oncology

## 2024-02-10 NOTE — Telephone Encounter (Signed)
 Received call back from pt.  Advised that we need to cancel her appt with Dr. Federico today as he is not in the office for the next 2 weeks.  She is to continue her B12 injections every 2 weeks. She had labs done on 02/03/24 and has no need for IV iron . Reviewed all her labs. She voiced understanding. We will re-schedule her for 6 months f/u

## 2024-02-16 DIAGNOSIS — F1014 Alcohol abuse with alcohol-induced mood disorder: Secondary | ICD-10-CM | POA: Diagnosis not present

## 2024-02-17 ENCOUNTER — Ambulatory Visit: Admitting: Hematology and Oncology

## 2024-02-17 DIAGNOSIS — D510 Vitamin B12 deficiency anemia due to intrinsic factor deficiency: Secondary | ICD-10-CM | POA: Diagnosis not present

## 2024-02-17 DIAGNOSIS — T753XXD Motion sickness, subsequent encounter: Secondary | ICD-10-CM | POA: Diagnosis not present

## 2024-02-17 DIAGNOSIS — Z23 Encounter for immunization: Secondary | ICD-10-CM | POA: Diagnosis not present

## 2024-02-17 DIAGNOSIS — F1090 Alcohol use, unspecified, uncomplicated: Secondary | ICD-10-CM | POA: Diagnosis not present

## 2024-02-22 DIAGNOSIS — F4310 Post-traumatic stress disorder, unspecified: Secondary | ICD-10-CM | POA: Diagnosis not present

## 2024-02-22 DIAGNOSIS — F411 Generalized anxiety disorder: Secondary | ICD-10-CM | POA: Diagnosis not present

## 2024-02-22 DIAGNOSIS — F1021 Alcohol dependence, in remission: Secondary | ICD-10-CM | POA: Diagnosis not present

## 2024-02-28 DIAGNOSIS — Z1231 Encounter for screening mammogram for malignant neoplasm of breast: Secondary | ICD-10-CM | POA: Diagnosis not present

## 2024-03-07 DIAGNOSIS — F411 Generalized anxiety disorder: Secondary | ICD-10-CM | POA: Diagnosis not present

## 2024-03-07 DIAGNOSIS — F4310 Post-traumatic stress disorder, unspecified: Secondary | ICD-10-CM | POA: Diagnosis not present

## 2024-03-07 DIAGNOSIS — F1021 Alcohol dependence, in remission: Secondary | ICD-10-CM | POA: Diagnosis not present

## 2024-03-08 DIAGNOSIS — E538 Deficiency of other specified B group vitamins: Secondary | ICD-10-CM | POA: Diagnosis not present

## 2024-03-08 DIAGNOSIS — L814 Other melanin hyperpigmentation: Secondary | ICD-10-CM | POA: Diagnosis not present

## 2024-03-08 DIAGNOSIS — D1801 Hemangioma of skin and subcutaneous tissue: Secondary | ICD-10-CM | POA: Diagnosis not present

## 2024-03-08 DIAGNOSIS — L720 Epidermal cyst: Secondary | ICD-10-CM | POA: Diagnosis not present

## 2024-03-08 DIAGNOSIS — L821 Other seborrheic keratosis: Secondary | ICD-10-CM | POA: Diagnosis not present

## 2024-03-13 ENCOUNTER — Other Ambulatory Visit: Payer: Self-pay | Admitting: Medical Genetics

## 2024-03-13 DIAGNOSIS — Z006 Encounter for examination for normal comparison and control in clinical research program: Secondary | ICD-10-CM

## 2024-03-15 DIAGNOSIS — F1014 Alcohol abuse with alcohol-induced mood disorder: Secondary | ICD-10-CM | POA: Diagnosis not present

## 2024-03-20 ENCOUNTER — Ambulatory Visit: Admitting: Plastic Surgery

## 2024-03-20 ENCOUNTER — Encounter: Payer: Self-pay | Admitting: Plastic Surgery

## 2024-03-20 ENCOUNTER — Encounter: Payer: Self-pay | Admitting: Physician Assistant

## 2024-03-20 VITALS — BP 130/90 | HR 88 | Ht 66.0 in | Wt 156.4 lb

## 2024-03-20 DIAGNOSIS — L723 Sebaceous cyst: Secondary | ICD-10-CM

## 2024-03-20 MED ORDER — DOXYCYCLINE HYCLATE 100 MG PO TABS: 100.0000 mg | ORAL_TABLET | Freq: Two times a day (BID) | ORAL | 0 refills | Status: AC

## 2024-03-20 MED ORDER — FLUCONAZOLE 150 MG PO TABS: 150.0000 mg | ORAL_TABLET | Freq: Every day | ORAL | 0 refills | Status: AC

## 2024-03-20 NOTE — Progress Notes (Signed)
   Subjective:    Patient ID: Ana Rose, female    DOB: 1976-05-18, 48 y.o.   MRN: [id number]  The patient is a 48 year old female here for evaluation of her chin.  The patient states that for about a year or maybe even longer she has had a cyst on the chin area.  As of recently its gotten larger and tender.  It broke the other day it and drained a bit of sebaceous fluid.  Now it is a little red and a little irritated.  It is about 2 cm in size.  No treatment has been rendered on it as of yet.      Review of Systems  Constitutional: Negative.   Eyes: Negative.   Respiratory: Negative.    Cardiovascular: Negative.   Gastrointestinal: Negative.   Endocrine: Negative.   Genitourinary: Negative.        Objective:   Physical Exam Constitutional:      Appearance: Normal appearance.  HENT:     Head: Atraumatic.   Cardiovascular:     Rate and Rhythm: Normal rate.     Pulses: Normal pulses.  Pulmonary:     Effort: Pulmonary effort is normal.  Musculoskeletal:        General: Swelling present. No deformity.  Skin:    Capillary Refill: Capillary refill takes less than 2 seconds.     Coloration: Skin is not jaundiced.     Findings: Bruising and lesion present.  Neurological:     Mental Status: She is alert and oriented to person, place, and time.  Psychiatric:        Mood and Affect: Mood normal.        Behavior: Behavior normal.        Thought Content: Thought content normal.        Judgment: Judgment normal.           Assessment & Plan:  No diagnosis found.   Plan for antibiotics to let the area settle down and then we will plan on excision of the sebaceous cyst.  Pictures were obtained of the patient and placed in the chart with the patient's or guardian's permission.

## 2024-03-21 DIAGNOSIS — F4310 Post-traumatic stress disorder, unspecified: Secondary | ICD-10-CM | POA: Diagnosis not present

## 2024-03-21 DIAGNOSIS — F1021 Alcohol dependence, in remission: Secondary | ICD-10-CM | POA: Diagnosis not present

## 2024-03-21 DIAGNOSIS — F411 Generalized anxiety disorder: Secondary | ICD-10-CM | POA: Diagnosis not present

## 2024-04-05 DIAGNOSIS — J029 Acute pharyngitis, unspecified: Secondary | ICD-10-CM | POA: Diagnosis not present

## 2024-04-05 DIAGNOSIS — Z6824 Body mass index (BMI) 24.0-24.9, adult: Secondary | ICD-10-CM | POA: Diagnosis not present

## 2024-04-09 DIAGNOSIS — E538 Deficiency of other specified B group vitamins: Secondary | ICD-10-CM | POA: Diagnosis not present

## 2024-04-09 DIAGNOSIS — F1021 Alcohol dependence, in remission: Secondary | ICD-10-CM | POA: Diagnosis not present

## 2024-04-12 DIAGNOSIS — F1014 Alcohol abuse with alcohol-induced mood disorder: Secondary | ICD-10-CM | POA: Diagnosis not present

## 2024-04-17 ENCOUNTER — Ambulatory Visit: Admitting: Plastic Surgery

## 2024-04-17 VITALS — BP 109/77 | HR 84

## 2024-04-17 DIAGNOSIS — L723 Sebaceous cyst: Secondary | ICD-10-CM | POA: Diagnosis not present

## 2024-04-17 NOTE — Progress Notes (Signed)
 Procedure Note  Preoperative Dx: sebaceous cyst of chin x 3  Postoperative Dx: Same  Procedure: Excision of sebaceous cyst x 3 (1 cm total)  Anesthesia: Lidocaine  1% with 1:100,000 epinephrine   Description of Procedure: Risks and complications were explained to the patient.  Consent was confirmed and the patient understands the risks and benefits.  The potential complications and alternatives were explained and the patient consents.  The patient expressed understanding the option of not having the procedure and the risks of a scar.  Time out was called and all information was confirmed to be correct.    The area was prepped and drapped.  Lidocaine  1% with epinephrine  was injected in the subcutaneous area.  After waiting several minutes for the local to take affect a #15 blade was used to incise the skin over the area.  There were three cysts.  All were excised for a total size of 1 cm. The skin edges were reapproximated with 6-0 Monocryl.  A dressing was applied.  The patient was given instructions on how to care for the area and a follow up appointment.  Dezra tolerated the procedure well and there were no complications.

## 2024-04-26 DIAGNOSIS — F1014 Alcohol abuse with alcohol-induced mood disorder: Secondary | ICD-10-CM | POA: Diagnosis not present

## 2024-05-01 ENCOUNTER — Ambulatory Visit: Admitting: Physician Assistant

## 2024-05-01 VITALS — BP 121/89 | HR 87

## 2024-05-01 DIAGNOSIS — Z4802 Encounter for removal of sutures: Secondary | ICD-10-CM

## 2024-05-01 NOTE — Progress Notes (Signed)
 Patient is a pleasant 48 year old female s/p excision of 3 chin lesions performed in office 04/17/2024 who returns to clinic for post-procedural follow up.    Reviewed procedural report and the excision sites were closed with 6-0 Monocryl.  The excised lesions appeared cystic. No pathology sent.  Today, she is doing well.  She is hopeful that the sutures can be removed as they are catching on her clothes.  Otherwise, no specific concerns or complaints.  On exam, skin edges are well-approximated.  3, simple interrupted Monocryl sutures are removed without complication or difficulty, well-tolerated by patient.  No bleeding provoked.  Apply thin film of Vaseline.  Recommending mechanical massage and silicone scar gel twice daily x 3 months.  Follow-up only as needed.  Picture(s) obtained of the patient and placed in the chart were with the patient's or guardian's permission.

## 2024-05-07 DIAGNOSIS — E538 Deficiency of other specified B group vitamins: Secondary | ICD-10-CM | POA: Diagnosis not present

## 2024-05-07 DIAGNOSIS — F1090 Alcohol use, unspecified, uncomplicated: Secondary | ICD-10-CM | POA: Diagnosis not present

## 2024-05-10 DIAGNOSIS — F1014 Alcohol abuse with alcohol-induced mood disorder: Secondary | ICD-10-CM | POA: Diagnosis not present

## 2024-07-27 ENCOUNTER — Other Ambulatory Visit

## 2024-08-03 ENCOUNTER — Ambulatory Visit: Admitting: Hematology and Oncology
